# Patient Record
Sex: Female | Born: 1950 | Race: White | Hispanic: No | Marital: Married | Smoking: Never smoker
Health system: Southern US, Community
[De-identification: ages and names within clinical notes are randomized; demographics above are authoritative.]

## PROBLEM LIST (undated history)

## (undated) DIAGNOSIS — N189 Chronic kidney disease, unspecified: Secondary | ICD-10-CM

## (undated) DIAGNOSIS — T7840XA Allergy, unspecified, initial encounter: Secondary | ICD-10-CM

## (undated) DIAGNOSIS — H409 Unspecified glaucoma: Secondary | ICD-10-CM

## (undated) DIAGNOSIS — K219 Gastro-esophageal reflux disease without esophagitis: Secondary | ICD-10-CM

## (undated) DIAGNOSIS — M810 Age-related osteoporosis without current pathological fracture: Secondary | ICD-10-CM

## (undated) DIAGNOSIS — I10 Essential (primary) hypertension: Secondary | ICD-10-CM

## (undated) DIAGNOSIS — D649 Anemia, unspecified: Secondary | ICD-10-CM

## (undated) DIAGNOSIS — J45909 Unspecified asthma, uncomplicated: Secondary | ICD-10-CM

## (undated) DIAGNOSIS — F32A Depression, unspecified: Secondary | ICD-10-CM

## (undated) DIAGNOSIS — G4730 Sleep apnea, unspecified: Secondary | ICD-10-CM

## (undated) DIAGNOSIS — H269 Unspecified cataract: Secondary | ICD-10-CM

## (undated) DIAGNOSIS — N200 Calculus of kidney: Secondary | ICD-10-CM

## (undated) DIAGNOSIS — E785 Hyperlipidemia, unspecified: Secondary | ICD-10-CM

## (undated) HISTORY — DX: Essential (primary) hypertension: I10

## (undated) HISTORY — DX: Unspecified cataract: H26.9

## (undated) HISTORY — DX: Unspecified asthma, uncomplicated: J45.909

## (undated) HISTORY — DX: Chronic kidney disease, unspecified: N18.9

## (undated) HISTORY — DX: Allergy, unspecified, initial encounter: T78.40XA

## (undated) HISTORY — DX: Anemia, unspecified: D64.9

## (undated) HISTORY — DX: Calculus of kidney: N20.0

## (undated) HISTORY — PX: POLYPECTOMY: SHX149

## (undated) HISTORY — DX: Gastro-esophageal reflux disease without esophagitis: K21.9

## (undated) HISTORY — DX: Depression, unspecified: F32.A

## (undated) HISTORY — PX: BREAST BIOPSY: SHX20

## (undated) HISTORY — DX: Unspecified glaucoma: H40.9

## (undated) HISTORY — DX: Sleep apnea, unspecified: G47.30

## (undated) HISTORY — PX: ADENOIDECTOMY: SUR15

## (undated) HISTORY — DX: Hyperlipidemia, unspecified: E78.5

## (undated) HISTORY — DX: Age-related osteoporosis without current pathological fracture: M81.0

## (undated) HISTORY — PX: TONSILLECTOMY: SUR1361

## (undated) HISTORY — PX: COLONOSCOPY: SHX174

---

## 1974-05-08 HISTORY — PX: OTHER SURGICAL HISTORY: SHX169

## 2001-11-21 ENCOUNTER — Encounter: Payer: Self-pay | Admitting: Internal Medicine

## 2001-11-21 ENCOUNTER — Ambulatory Visit (HOSPITAL_COMMUNITY): Admission: RE | Admit: 2001-11-21 | Discharge: 2001-11-21 | Payer: Self-pay | Admitting: Internal Medicine

## 2001-12-04 ENCOUNTER — Other Ambulatory Visit: Admission: RE | Admit: 2001-12-04 | Discharge: 2001-12-04 | Payer: Self-pay | Admitting: Internal Medicine

## 2002-02-20 LAB — HM COLONOSCOPY

## 2003-09-07 ENCOUNTER — Ambulatory Visit (HOSPITAL_COMMUNITY): Admission: RE | Admit: 2003-09-07 | Discharge: 2003-09-07 | Payer: Self-pay | Admitting: Internal Medicine

## 2005-06-22 ENCOUNTER — Ambulatory Visit (HOSPITAL_COMMUNITY): Admission: RE | Admit: 2005-06-22 | Discharge: 2005-06-22 | Payer: Self-pay | Admitting: Internal Medicine

## 2005-06-23 ENCOUNTER — Ambulatory Visit: Payer: Self-pay | Admitting: Internal Medicine

## 2005-07-03 ENCOUNTER — Encounter: Payer: Self-pay | Admitting: Internal Medicine

## 2005-07-03 ENCOUNTER — Other Ambulatory Visit: Admission: RE | Admit: 2005-07-03 | Discharge: 2005-07-03 | Payer: Self-pay | Admitting: Internal Medicine

## 2005-07-03 ENCOUNTER — Ambulatory Visit: Payer: Self-pay | Admitting: Internal Medicine

## 2005-07-03 LAB — CONVERTED CEMR LAB: Pap Smear: NORMAL

## 2005-08-08 ENCOUNTER — Ambulatory Visit: Payer: Self-pay | Admitting: Internal Medicine

## 2005-08-14 ENCOUNTER — Ambulatory Visit: Payer: Self-pay | Admitting: Internal Medicine

## 2005-10-26 ENCOUNTER — Ambulatory Visit: Payer: Self-pay | Admitting: Internal Medicine

## 2006-02-05 ENCOUNTER — Ambulatory Visit: Payer: Self-pay | Admitting: Internal Medicine

## 2006-02-12 ENCOUNTER — Ambulatory Visit: Payer: Self-pay | Admitting: Internal Medicine

## 2006-02-12 DIAGNOSIS — I10 Essential (primary) hypertension: Secondary | ICD-10-CM | POA: Insufficient documentation

## 2006-02-12 DIAGNOSIS — B029 Zoster without complications: Secondary | ICD-10-CM | POA: Insufficient documentation

## 2006-02-12 DIAGNOSIS — Z87442 Personal history of urinary calculi: Secondary | ICD-10-CM

## 2006-02-12 DIAGNOSIS — R030 Elevated blood-pressure reading, without diagnosis of hypertension: Secondary | ICD-10-CM

## 2006-02-12 DIAGNOSIS — E785 Hyperlipidemia, unspecified: Secondary | ICD-10-CM

## 2006-02-12 DIAGNOSIS — E1169 Type 2 diabetes mellitus with other specified complication: Secondary | ICD-10-CM | POA: Insufficient documentation

## 2006-05-15 ENCOUNTER — Ambulatory Visit: Payer: Self-pay | Admitting: Internal Medicine

## 2006-11-08 ENCOUNTER — Ambulatory Visit: Payer: Self-pay | Admitting: Internal Medicine

## 2006-11-08 LAB — CONVERTED CEMR LAB
ALT: 31 units/L (ref 0–35)
AST: 25 units/L (ref 0–37)
Albumin: 4.2 g/dL (ref 3.5–5.2)
Alkaline Phosphatase: 74 units/L (ref 39–117)
BUN: 12 mg/dL (ref 6–23)
Basophils Absolute: 0 10*3/uL (ref 0.0–0.1)
Basophils Relative: 0.1 % (ref 0.0–1.0)
Bilirubin, Direct: 0.1 mg/dL (ref 0.0–0.3)
CO2: 31 meq/L (ref 19–32)
Calcium: 9.6 mg/dL (ref 8.4–10.5)
Chloride: 103 meq/L (ref 96–112)
Cholesterol: 201 mg/dL (ref 0–200)
Creatinine, Ser: 0.7 mg/dL (ref 0.4–1.2)
Direct LDL: 124.4 mg/dL
Eosinophils Absolute: 0.3 10*3/uL (ref 0.0–0.6)
Eosinophils Relative: 4.8 % (ref 0.0–5.0)
GFR calc Af Amer: 111 mL/min
GFR calc non Af Amer: 92 mL/min
Glucose, Bld: 101 mg/dL — ABNORMAL HIGH (ref 70–99)
HCT: 43.6 % (ref 36.0–46.0)
HDL: 70.2 mg/dL (ref >39.0)
Hemoglobin: 14.6 g/dL (ref 12.0–15.0)
Lymphocytes Relative: 37.8 % (ref 12.0–46.0)
MCHC: 33.3 g/dL (ref 30.0–36.0)
MCV: 90 fL (ref 78.0–100.0)
Monocytes Absolute: 0.4 10*3/uL (ref 0.2–0.7)
Monocytes Relative: 6.2 % (ref 3.0–11.0)
Neutro Abs: 3.4 10*3/uL (ref 1.4–7.7)
Neutrophils Relative %: 51.1 % (ref 43.0–77.0)
Platelets: 260 10*3/uL (ref 150–400)
Potassium: 3.6 meq/L (ref 3.5–5.1)
RBC: 4.85 M/uL (ref 3.87–5.11)
RDW: 11.6 % (ref 11.5–14.6)
Sodium: 142 meq/L (ref 135–145)
TSH: 0.72 microintl units/mL (ref 0.35–5.50)
Total Bilirubin: 0.8 mg/dL (ref 0.3–1.2)
Total CHOL/HDL Ratio: 2.9
Total Protein: 7.7 g/dL (ref 6.0–8.3)
Triglycerides: 74 mg/dL (ref 0–149)
VLDL: 15 mg/dL (ref 0–40)
WBC: 6.6 10*3/uL (ref 4.5–10.5)

## 2006-11-27 ENCOUNTER — Ambulatory Visit: Payer: Self-pay | Admitting: Internal Medicine

## 2006-11-27 ENCOUNTER — Encounter: Payer: Self-pay | Admitting: Internal Medicine

## 2006-11-27 ENCOUNTER — Other Ambulatory Visit: Admission: RE | Admit: 2006-11-27 | Discharge: 2006-11-27 | Payer: Self-pay | Admitting: Internal Medicine

## 2006-11-30 ENCOUNTER — Ambulatory Visit: Payer: Self-pay | Admitting: Gastroenterology

## 2006-12-12 ENCOUNTER — Ambulatory Visit (HOSPITAL_COMMUNITY): Admission: RE | Admit: 2006-12-12 | Discharge: 2006-12-12 | Payer: Self-pay | Admitting: Internal Medicine

## 2006-12-28 ENCOUNTER — Ambulatory Visit: Payer: Self-pay | Admitting: Internal Medicine

## 2007-04-19 ENCOUNTER — Telehealth: Payer: Self-pay | Admitting: Internal Medicine

## 2007-04-19 ENCOUNTER — Ambulatory Visit: Payer: Self-pay | Admitting: Cardiology

## 2007-04-19 ENCOUNTER — Ambulatory Visit: Payer: Self-pay | Admitting: Internal Medicine

## 2007-04-19 DIAGNOSIS — N200 Calculus of kidney: Secondary | ICD-10-CM

## 2007-04-19 LAB — CONVERTED CEMR LAB
Bilirubin Urine: NEGATIVE
Glucose, Urine, Semiquant: NEGATIVE
Ketones, urine, test strip: NEGATIVE
Nitrite: NEGATIVE
Specific Gravity, Urine: >=1.03
Urobilinogen, UA: 0.2
WBC Urine, dipstick: NEGATIVE
pH: 6

## 2007-04-22 ENCOUNTER — Encounter: Payer: Self-pay | Admitting: Internal Medicine

## 2007-05-30 ENCOUNTER — Encounter: Payer: Self-pay | Admitting: Internal Medicine

## 2007-09-24 ENCOUNTER — Telehealth: Payer: Self-pay | Admitting: Internal Medicine

## 2007-10-03 ENCOUNTER — Telehealth: Payer: Self-pay | Admitting: Internal Medicine

## 2007-10-11 ENCOUNTER — Encounter: Payer: Self-pay | Admitting: Internal Medicine

## 2008-01-20 ENCOUNTER — Encounter: Payer: Self-pay | Admitting: Internal Medicine

## 2009-01-21 ENCOUNTER — Encounter (INDEPENDENT_AMBULATORY_CARE_PROVIDER_SITE_OTHER): Payer: Self-pay | Admitting: *Deleted

## 2009-05-08 HISTORY — PX: LITHOTRIPSY: SUR834

## 2009-05-22 ENCOUNTER — Encounter: Payer: Self-pay | Admitting: Internal Medicine

## 2009-05-22 ENCOUNTER — Emergency Department (HOSPITAL_COMMUNITY): Admission: EM | Admit: 2009-05-22 | Discharge: 2009-05-22 | Payer: Self-pay | Admitting: Emergency Medicine

## 2009-05-24 ENCOUNTER — Telehealth: Payer: Self-pay | Admitting: Internal Medicine

## 2009-05-26 ENCOUNTER — Encounter: Payer: Self-pay | Admitting: Internal Medicine

## 2009-06-02 ENCOUNTER — Encounter: Payer: Self-pay | Admitting: Internal Medicine

## 2009-06-25 ENCOUNTER — Ambulatory Visit: Payer: Self-pay | Admitting: Internal Medicine

## 2009-06-25 LAB — CONVERTED CEMR LAB
ALT: 41 units/L — ABNORMAL HIGH (ref 0–35)
AST: 25 units/L (ref 0–37)
BUN: 17 mg/dL (ref 6–23)
Basophils Relative: 0 % (ref 0.0–3.0)
Bilirubin Urine: NEGATIVE
Blood in Urine, dipstick: NEGATIVE
CO2: 28 meq/L (ref 19–32)
Calcium: 9.8 mg/dL (ref 8.4–10.5)
Cholesterol: 235 mg/dL — ABNORMAL HIGH (ref 0–200)
Direct LDL: 177.4 mg/dL
Hemoglobin: 13.7 g/dL (ref 12.0–15.0)
Lymphocytes Relative: 38.9 % (ref 12.0–46.0)
Lymphs Abs: 2.5 10*3/uL (ref 0.7–4.0)
MCV: 94.2 fL (ref 78.0–100.0)
Neutrophils Relative %: 50.2 % (ref 43.0–77.0)
Nitrite: NEGATIVE
Protein, U semiquant: NEGATIVE
RBC: 4.43 M/uL (ref 3.87–5.11)
Sodium: 141 meq/L (ref 135–145)
Total Protein: 7.3 g/dL (ref 6.0–8.3)
Triglycerides: 99 mg/dL (ref 0.0–149.0)
Urobilinogen, UA: 0.2
WBC Urine, dipstick: NEGATIVE

## 2009-06-28 ENCOUNTER — Ambulatory Visit (HOSPITAL_COMMUNITY): Admission: RE | Admit: 2009-06-28 | Discharge: 2009-06-28 | Payer: Self-pay | Admitting: Internal Medicine

## 2009-06-30 ENCOUNTER — Encounter: Payer: Self-pay | Admitting: Internal Medicine

## 2009-07-02 ENCOUNTER — Encounter: Payer: Self-pay | Admitting: Internal Medicine

## 2009-07-05 ENCOUNTER — Other Ambulatory Visit: Admission: RE | Admit: 2009-07-05 | Discharge: 2009-07-05 | Payer: Self-pay | Admitting: Internal Medicine

## 2009-07-05 ENCOUNTER — Ambulatory Visit: Payer: Self-pay | Admitting: Internal Medicine

## 2009-07-05 LAB — HM PAP SMEAR

## 2009-07-09 ENCOUNTER — Encounter: Admission: RE | Admit: 2009-07-09 | Discharge: 2009-07-09 | Payer: Self-pay | Admitting: Internal Medicine

## 2009-07-09 HISTORY — PX: BREAST BIOPSY: SHX20

## 2009-07-09 LAB — CONVERTED CEMR LAB: Pap Smear: NEGATIVE

## 2009-07-09 LAB — HM MAMMOGRAPHY

## 2009-09-28 ENCOUNTER — Ambulatory Visit: Payer: Self-pay | Admitting: Internal Medicine

## 2009-09-28 LAB — CONVERTED CEMR LAB
ALT: 33 units/L (ref 0–35)
Bilirubin, Direct: 0.1 mg/dL (ref 0.0–0.3)
HDL: 52.9 mg/dL (ref >39.00)
LDL Cholesterol: 99 mg/dL (ref 0–99)
Total Protein: 7.3 g/dL (ref 6.0–8.3)
Triglycerides: 79 mg/dL (ref 0.0–149.0)
VLDL: 15.8 mg/dL (ref 0.0–40.0)

## 2009-10-05 ENCOUNTER — Ambulatory Visit: Payer: Self-pay | Admitting: Internal Medicine

## 2009-10-05 DIAGNOSIS — R109 Unspecified abdominal pain: Secondary | ICD-10-CM | POA: Insufficient documentation

## 2009-10-13 ENCOUNTER — Telehealth: Payer: Self-pay | Admitting: Internal Medicine

## 2009-10-25 ENCOUNTER — Telehealth: Payer: Self-pay | Admitting: Internal Medicine

## 2009-12-10 ENCOUNTER — Encounter: Payer: Self-pay | Admitting: Internal Medicine

## 2009-12-31 ENCOUNTER — Encounter: Payer: Self-pay | Admitting: Internal Medicine

## 2010-01-06 ENCOUNTER — Ambulatory Visit (HOSPITAL_COMMUNITY): Admission: RE | Admit: 2010-01-06 | Discharge: 2010-01-06 | Payer: Self-pay | Admitting: Urology

## 2010-02-21 ENCOUNTER — Encounter: Payer: Self-pay | Admitting: Internal Medicine

## 2010-06-07 NOTE — Letter (Signed)
Summary: [facility]  [facility]   Imported By: Maryln Gottron 02/28/2010 11:16:14  _____________________________________________________________________  External Attachment:    Type:   Image     Comment:   External Document

## 2010-06-07 NOTE — Letter (Signed)
Summary: [facility]   Imported By: Maryln Gottron 05/27/2009 10:54:09  _____________________________________________________________________  External Attachment:    Type:   Image     Comment:   External Document

## 2010-06-07 NOTE — Letter (Signed)
Summary: [facility]  [facility]   Imported By: Maryln Gottron 06/10/2009 14:49:42  _____________________________________________________________________  External Attachment:    Type:   Image     Comment:   External Document

## 2010-06-07 NOTE — Assessment & Plan Note (Signed)
Summary: CPX//CCM   Vital Signs:  Patient profile:   60 year old female Menstrual status:  postmenopausal Height:      60.75 inches Weight:      171 pounds BMI:     32.69 Pulse rate:   84 / minute Resp:     12 per minute BP sitting:   144 / 80  (left arm)  Vitals Entered By: Gladis Riffle, RN (July 05, 2009 9:57 AM)  Nutrition Counseling: Patient's BMI is greater than 25 and therefore counseled on weight management options. CC: cpx with Pap, labs done Is Patient Diabetic? No Comments out of simvastatin x 6 months, also out of budeprion but does not feel she needs     Menstrual Status postmenopausal Last PAP Result Normal   CC:  cpx with Pap and labs done.  History of Present Illness: cpx  Preventive Screening-Counseling & Management  Alcohol-Tobacco     Alcohol drinks/day: <1     Smoking Status: never  Current Problems (verified): 1)  Nephrolithiasis  (ICD-592.0) 2)  Essential Hypertension  (ICD-401.9) 3)  Physical Examination, Normal  (ICD-V70.0) 4)  Elevated Blood Pressure Without Diagnosis of Hypertension  (ICD-796.2) 5)  Herpes Zoster  (ICD-053.9) 6)  Nephrolithiasis, Hx of  (ICD-V13.01) 7)  Hypertension  (ICD-401.9) 8)  Hyperlipidemia  (ICD-272.4)  Current Medications (verified): 1)  Caltrate 600+d Plus 600-400 Mg-Unit Tabs (Calcium Carbonate-Vit D-Min) .... Take 1 Tablet By Mouth Twice A Day 2)  Omega-3 1000 Mg Caps (Omega-3 Fatty Acids) .... Two Times A Day 3)  Claritin 10 Mg  Tabs (Loratadine) .... Prn 4)  Multivitamins  Tabs (Multiple Vitamin) .... Once Daily  Allergies: 1)  Iodine (Iodine)  Past History:  Past Medical History: Last updated: 02/12/2006 Hyperlipidemia Hypertension Nephrolithiasis, hx of  Past Surgical History: Last updated: 11/05/2006 Caesarean section X2 Tonsillectomy, adenoidectomy as child hysteroscopy  Family History: Last updated: 07/05/2009 mother with hypertension father deceased--"heart failure" osteogenesis  imperfecta---age 69s aunt with DM  Social History: Last updated: 11/27/2006 Married disabeled son at home Never Smoked Alcohol use-yes  Risk Factors: Alcohol Use: <1 (07/05/2009)  Risk Factors: Smoking Status: never (07/05/2009)  Family History: mother with hypertension father deceased--"heart failure" osteogenesis imperfecta---age 48s aunt with DM  Review of Systems       All other systems reviewed and were negative   Physical Exam  Genitalia:  Pelvic Exam:        External: normal female genitalia without lesions or masses        Vagina: normal without lesions or masses        Cervix: normal without lesions or masses        Adnexa: normal bimanual exam without masses or fullness        Uterus: normal by palpation        Pap smear: performed   Impression & Recommendations:  Problem # 1:  PHYSICAL EXAMINATION, NORMAL (ICD-V70.0) health maint UTD she needs to lose weight- discussed exercise/low calorie diet  Problem # 2:  ESSENTIAL HYPERTENSION (ICD-401.9) resume lisinopril The following medications were removed from the medication list:    Lisinopril 20 Mg Tabs (Lisinopril) ..... Once daily--needs office visit for additional refills Her updated medication list for this problem includes:    Lisinopril 20 Mg Tabs (Lisinopril) .Marland Kitchen... 1 tablet by mouth daily  BP today: 144/80 Prior BP: 128/88 (04/19/2007)  Prior 10 Yr Risk Heart Disease: Not enough information (02/12/2006)  Labs Reviewed: K+: 4.3 (06/25/2009) Creat: : 0.8 (06/25/2009)   Chol:  235 (06/25/2009)   HDL: 58.60 (06/25/2009)   LDL: DEL (11/08/2006)   TG: 99.0 (06/25/2009)  Problem # 3:  HYPERLIPIDEMIA (ICD-272.4) resume simvastatin The following medications were removed from the medication list:    Simvastatin 40 Mg Tabs (Simvastatin) .Marland Kitchen... Take 1 tablet by mouth at bedtime Her updated medication list for this problem includes:    Simvastatin 40 Mg Tabs (Simvastatin) .Marland Kitchen... Take one tablet at  bedtime  Complete Medication List: 1)  Caltrate 600+d Plus 600-400 Mg-unit Tabs (Calcium carbonate-vit d-min) .... Take 1 tablet by mouth twice a day 2)  Omega-3 1000 Mg Caps (Omega-3 fatty acids) .... Two times a day 3)  Claritin 10 Mg Tabs (Loratadine) .... Prn 4)  Multivitamins Tabs (Multiple vitamin) .... Once daily 5)  Lisinopril 20 Mg Tabs (Lisinopril) .Marland Kitchen.. 1 tablet by mouth daily 6)  Simvastatin 40 Mg Tabs (Simvastatin) .... Take one tablet at bedtime  Patient Instructions: 1)  Please schedule a follow-up appointment in 3 months. 2)  lipids 272.4 3)  liver 995.2  Prescriptions: SIMVASTATIN 40 MG TABS (SIMVASTATIN) Take one tablet at bedtime  #90 x 3   Entered and Authorized by:   Birdie Sons MD   Signed by:   Birdie Sons MD on 07/05/2009   Method used:   Electronically to        [facility]* (mail-order)       [address] [place], [place]  [address]       Ph: [phone]       Fax: [phone]   RxID:   [id number] LISINOPRIL 20 MG TABS (LISINOPRIL) 1 tablet by mouth daily  #90 x 3   Entered and Authorized by:   Birdie Sons MD   Signed by:   Birdie Sons MD on 07/05/2009   Method used:   Electronically to        [facility]* (mail-order)       [address] [place], [place]  [address]       Ph: [phone]       Fax: [phone]   RxID:   [id number]    Physical Exam General Appearance: well developed, well nourished, no acute distress Eyes: conjunctiva and lids normal, PERRL, EOMI,  Ears, Nose, Mouth, Throat: TM clear, nares clear, oral exam WNL Neck: supple, no lymphadenopathy, no thyromegaly, no JVD Respiratory: clear to auscultation and percussion, respiratory effort normal Cardiovascular: regular rate and rhythm, S1-S2, no murmur, rub or gallop, no bruits, peripheral pulses normal and symmetric, no cyanosis, clubbing, edema or varicosities Chest: no scars, masses, tenderness; no asymmetry, skin changes,  nipple discharge   Gastrointestinal: soft, non-tender; no hepatosplenomegaly, masses; active bowel sounds all quadrants,  Lymphatic: no cervical, axillary or inguinal adenopathy Musculoskeletal: gait normal, muscle tone and strength WNL, no joint swelling, effusions, discoloration, crepitus  Skin: clear, good turgor, color WNL, no rashes, lesions, or ulcerations Neurologic: normal mental status, normal reflexes, normal strength, sensation, and motion Psychiatric: alert; oriented to person, place and time Other Exam:     Prevention & Chronic Care Immunizations   Influenza vaccine: fluzone  (01/17/2008)    Tetanus booster: 05/09/1999: Historical    Pneumococcal vaccine: Not documented    H. zoster vaccine: Not documented  Colorectal Screening   Hemoccult: Not documented    Colonoscopy:  Results: Normal.   (02/20/2002)  Other Screening   Pap smear: Normal  (07/03/2005)    Mammogram: mammograms  for comparison - BI-RADS 0 -^MM DIGITAL SCREENING  (06/28/2009)    DXA bone density scan: Not documented   Smoking status: never  (07/05/2009)  Lipids   Total Cholesterol: 235  (06/25/2009)   LDL: DEL  (11/08/2006)   LDL Direct: 177.4  (06/25/2009)   HDL: 58.60  (06/25/2009)   Triglycerides: 99.0  (06/25/2009)    SGOT (AST): 25  (06/25/2009)   SGPT (ALT): 41  (06/25/2009)   Alkaline phosphatase: 69  (06/25/2009)   Total bilirubin: 0.5  (06/25/2009)  Hypertension   Last Blood Pressure: 144 / 80  (07/05/2009)   Serum creatinine: 0.8  (06/25/2009)   Serum potassium 4.3  (06/25/2009)  Self-Management Support :    Patient will work on the following items until the next clinic visit to reach self-care goals:     Medications and monitoring: take my medicines every day  (07/05/2009)     Eating: drink diet soda or water instead of juice or soda, eat more vegetables  (07/05/2009)     Activity: take a 30 minute walk every day, take the stairs instead of the elevator, park at the far  end of the parking lot, join a walking program  (07/05/2009)    Hypertension self-management support: , Referred for medical nutrition therapy  (07/05/2009)    Lipid self-management support: , Referred for medical nutrition therapy  (07/05/2009)    Nursing Instructions: Refer for medical nutrition therapy (see order)    Diabetes Self Management Training Referral Patient Name: Rhonda Spence [facility] Date Of Birth: Dec 12, 1950 MRN: [id number] Current Diagnosis:  NEPHROLITHIASIS (ICD-592.0) ESSENTIAL HYPERTENSION (ICD-401.9) PHYSICAL EXAMINATION, NORMAL (ICD-V70.0) ELEVATED BLOOD PRESSURE WITHOUT DIAGNOSIS OF HYPERTENSION (ICD-796.2) HERPES ZOSTER (ICD-053.9) NEPHROLITHIASIS, HX OF (ICD-V13.01) HYPERTENSION (ICD-401.9) HYPERLIPIDEMIA (ICD-272.4)   Complicating Conditions:  Appended Document: CPX//CCM   Immunizations Administered:  Tetanus Vaccine:    Vaccine Type: Tdap    Site: left deltoid    Mfr: [employer]    Dose: 0.5 ml    Route: IM    Given by: Gladis Riffle, RN    Exp. Date: 07/03/2011    Lot #: [id number]

## 2010-06-07 NOTE — Progress Notes (Signed)
Summary: medication question  Phone Note Call from Patient   Caller: Patient Call For: Birdie Sons MD Summary of Call: Pt is taking Losartin and is on Postassium 10 meq, and is concerned if she should change her BP pill.  Dr. Scarlett Presto has her on the K due to urinary calculus [phone] Initial call taken by: Lynann Beaver CMA,  October 25, 2009 10:21 AM  Follow-up for Phone Call        ok to continue both Follow-up by: Birdie Sons MD,  October 25, 2009 4:44 PM  Additional Follow-up for Phone Call Additional follow up Details #1::        Pt. notified. Additional Follow-up by: Lynann Beaver CMA,  October 25, 2009 4:57 PM

## 2010-06-07 NOTE — Progress Notes (Signed)
Summary: fu on call note  Phone Note Outgoing Call   Call placed by: Triage Call placed to: Patient Summary of Call: Called to followup on on call note for weekend for back pain & nausea.  Did go to ER for severe pain & vomiting.  Meds received Oxycontin, Flomax, a nausea med under the tongue use, a urethral spasms med.  She has an appointment Wed 8:30 with [facility].  There is not anything she needs at this time.  She took Oxycontin Sat 8:45 p & 4a.  Slept all day.  No further pain.  Is drinking lots of water & straining her urine & has not seen anything there.   Initial call taken by: Rudy Jew, RN,  May 24, 2009 3:55 PM

## 2010-06-07 NOTE — Letter (Signed)
Summary: [facility]   Imported By: Maryln Gottron 12/23/2009 11:00:21  _____________________________________________________________________  External Attachment:    Type:   Image     Comment:   External Document

## 2010-06-07 NOTE — Letter (Signed)
Summary: [facility]  [facility]   Imported By: Maryln Gottron 03/23/2010 11:28:07  _____________________________________________________________________  External Attachment:    Type:   Image     Comment:   External Document

## 2010-06-07 NOTE — Letter (Signed)
Summary: [facility]  [facility]   Imported By: Maryln Gottron 06/10/2009 13:59:56  _____________________________________________________________________  External Attachment:    Type:   Image     Comment:   External Document

## 2010-06-07 NOTE — Letter (Signed)
Summary: [facility]  [facility]   Imported By: Maryln Gottron 07/07/2009 14:46:05  _____________________________________________________________________  External Attachment:    Type:   Image     Comment:   External Document

## 2010-06-07 NOTE — Assessment & Plan Note (Signed)
Summary: 3 month rov/nrj   Vital Signs:  Patient profile:   60 year old female Menstrual status:  postmenopausal Weight:      168 pounds Temp:     99.2 degrees F oral Resp:     12 per minute BP sitting:   136 / 80  Vitals Entered By: Lynann Beaver CMA (Oct 05, 2009 9:51 AM) CC: started with LUQ pain last night.  + nausea, - vomiting, + diarrhea Is Patient Diabetic? No Pain Assessment Patient in pain? yes        CC:  started with LUQ pain last night.  + nausea, - vomiting, and + diarrhea.  History of Present Illness:  Follow-Up Visit      This is a 60 year old woman who presents for Follow-up visit.  The patient denies chest pain and palpitations.  Since the last visit the patient notes no new problems or concerns except reports 24 hours of left flank discomfort. Started after working out in the yard the day prior. She states taking a deep breath increases pain as does twisting/turning torso. No SOB.  The patient reports taking meds as prescribed.  When questioned about possible medication side effects, the patient notes none.    All other systems reviewed and were negative   Current Medications (verified): 1)  Caltrate 600+d Plus 600-400 Mg-Unit Tabs (Calcium Carbonate-Vit D-Min) .... Take 1 Tablet By Mouth Twice A Day 2)  Omega-3 1000 Mg Caps (Omega-3 Fatty Acids) .... Two Times A Day 3)  Claritin 10 Mg  Tabs (Loratadine) .... Prn 4)  Multivitamins  Tabs (Multiple Vitamin) .... Once Daily 5)  Lisinopril 20 Mg Tabs (Lisinopril) .Marland Kitchen.. 1 Tablet By Mouth Daily 6)  Simvastatin 40 Mg Tabs (Simvastatin) .... Take One Tablet At Bedtime 7)  Potassium Citrate 10 Meq (1080 Mg) Cr-Tabs (Potassium Citrate) .... Take 1 Tablet By Mouth Once A Day  Allergies (verified): 1)  Iodine (Iodine)  Past History:  Past Medical History: Last updated: 02/12/2006 Hyperlipidemia Hypertension Nephrolithiasis, hx of  Past Surgical History: Last updated: 11/05/2006 Caesarean section  X2 Tonsillectomy, adenoidectomy as child hysteroscopy  Family History: Last updated: 07/05/2009 mother with hypertension father deceased--"heart failure" osteogenesis imperfecta---age 4s aunt with DM  Social History: Last updated: 11/27/2006 Married disabeled son at home Never Smoked Alcohol use-yes  Risk Factors: Alcohol Use: <1 (07/05/2009)  Risk Factors: Smoking Status: never (07/05/2009)  Physical Exam  Head:  normocephalic and atraumatic.   Eyes:  pupils equal and pupils round.   Ears:  R ear normal and L ear normal.   Neck:  No deformities, masses, or tenderness noted. Chest Wall:  No deformities, masses, or tenderness noted. Lungs:  normal respiratory effort and no intercostal retractions.   Heart:  normal rate and regular rhythm.   Abdomen:  Bowel sounds positive,abdomen soft and non-tender without masses, organomegaly or hernias noted. right lower ribs tender to palpation  overweight Msk:  No deformity or scoliosis noted of thoracic or lumbar spine.   Pulses:  R radial normal and L radial normal.   Neurologic:  cranial nerves II-XII intact and gait normal.     Impression & Recommendations:  Problem # 1:  ESSENTIAL HYPERTENSION (ICD-401.9) controlled continue current medications  Her updated medication list for this problem includes:    Lisinopril 20 Mg Tabs (Lisinopril) .Marland Kitchen... 1 tablet by mouth daily  BP today: 136/80 Prior BP: 144/80 (07/05/2009)  Prior 10 Yr Risk Heart Disease: Not enough information (02/12/2006)  Labs Reviewed: K+: 4.3 (  06/25/2009) Creat: : 0.8 (06/25/2009)   Chol: 168 (09/28/2009)   HDL: 52.90 (09/28/2009)   LDL: 99 (09/28/2009)   TG: 79.0 (09/28/2009)  Problem # 2:  HYPERLIPIDEMIA (ICD-272.4) Assessment: Improved well conttrolled continue current medications  Her updated medication list for this problem includes:    Simvastatin 40 Mg Tabs (Simvastatin) .Marland Kitchen... Take one tablet at bedtime  Labs Reviewed: SGOT: 25  (09/28/2009)   SGPT: 33 (09/28/2009)  Prior 10 Yr Risk Heart Disease: Not enough information (02/12/2006)   HDL:52.90 (09/28/2009), 58.60 (06/25/2009)  LDL:99 (09/28/2009), DEL (16/02/9603)  Chol:168 (09/28/2009), 235 (06/25/2009)  Trig:79.0 (09/28/2009), 99.0 (06/25/2009)  Problem # 3:  NEPHROLITHIASIS, HX OF (ICD-V13.01) reviewed by dr Aldean Ast i have reviewed note and updated med list  Problem # 4:  ABDOMINAL PAIN (ICD-789.00) likely mechanical based on exam and hx she will call if sxs persist  Complete Medication List: 1)  Caltrate 600+d Plus 600-400 Mg-unit Tabs (Calcium carbonate-vit d-min) .... Take 1 tablet by mouth twice a day 2)  Omega-3 1000 Mg Caps (Omega-3 fatty acids) .... Two times a day 3)  Claritin 10 Mg Tabs (Loratadine) .... Prn 4)  Multivitamins Tabs (Multiple vitamin) .... Once daily 5)  Lisinopril 20 Mg Tabs (Lisinopril) .Marland Kitchen.. 1 tablet by mouth daily 6)  Simvastatin 40 Mg Tabs (Simvastatin) .... Take one tablet at bedtime 7)  Potassium Citrate 10 Meq (1080 Mg) Cr-tabs (Potassium citrate) .... Take 1 tablet by mouth once a day  Patient Instructions: 1)  CPX in February   Appended Document: 3 month rov/nrj     Allergies: 1)  Iodine (Iodine)   Impression & Recommendations:  Problem # 1:  ELEVATED BLOOD PRESSURE WITHOUT DIAGNOSIS OF HYPERTENSION (ICD-796.2) at the end of exam pt complains of cough---she associates with lisinopril discussed at length she will change meds and monitor bp call for bp > 135/85 The following medications were removed from the medication list:    Lisinopril 20 Mg Tabs (Lisinopril) .Marland Kitchen... 1 tablet by mouth daily Her updated medication list for this problem includes:    Losartan Potassium 100 Mg Tabs (Losartan potassium) .Marland Kitchen... Take 1 tablet by mouth once a day  Complete Medication List: 1)  Caltrate 600+d Plus 600-400 Mg-unit Tabs (Calcium carbonate-vit d-min) .... Take 1 tablet by mouth twice a day 2)  Omega-3 1000 Mg Caps  (Omega-3 fatty acids) .... Two times a day 3)  Claritin 10 Mg Tabs (Loratadine) .... Prn 4)  Multivitamins Tabs (Multiple vitamin) .... Once daily 5)  Simvastatin 40 Mg Tabs (Simvastatin) .... Take one tablet at bedtime 6)  Potassium Citrate 10 Meq (1080 Mg) Cr-tabs (Potassium citrate) .... Take 1 tablet by mouth once a day 7)  Losartan Potassium 100 Mg Tabs (Losartan potassium) .... Take 1 tablet by mouth once a day Prescriptions: LOSARTAN POTASSIUM 100 MG TABS (LOSARTAN POTASSIUM) Take 1 tablet by mouth once a day  #90 x 3   Entered and Authorized by:   Birdie Sons MD   Signed by:   Birdie Sons MD on 10/05/2009   Method used:   Faxed to ...       [facility] (mail-order)       [address] [place], [place]  [address]       Ph: [phone]       Fax: [phone]   RxID:   [id number]

## 2010-06-07 NOTE — Progress Notes (Signed)
  Phone Note Call from Patient   Caller: Patient Call For: Birdie Sons MD Summary of Call: Pt was changed from Lisinopril to Losarten recently. She was reading the information on the meds, and is questioning if she should take it with a potassium supplement. [phone] Initial call taken by: Lynann Beaver CMA,  October 13, 2009 8:39 AM  Follow-up for Phone Call        OK to d/c potassium substitute Follow-up by: Gordy Savers  MD,  October 13, 2009 12:16 PM    New/Updated Medications: POTASSIUM CITRATE 10 MEQ (1080 MG) CR-TABS (POTASSIUM CITRATE) Take 1 tablet by mouth once a day

## 2010-06-08 LAB — HM MAMMOGRAPHY: HM Mammogram: NORMAL

## 2010-06-23 ENCOUNTER — Other Ambulatory Visit: Payer: Self-pay | Admitting: Internal Medicine

## 2010-06-23 DIAGNOSIS — Z1231 Encounter for screening mammogram for malignant neoplasm of breast: Secondary | ICD-10-CM

## 2010-06-30 ENCOUNTER — Ambulatory Visit
Admission: RE | Admit: 2010-06-30 | Discharge: 2010-06-30 | Disposition: A | Discharge status: Home / Self Care | Payer: BC Managed Care – PPO | Source: Ambulatory Visit | Attending: Internal Medicine | Admitting: Internal Medicine

## 2010-06-30 DIAGNOSIS — Z1231 Encounter for screening mammogram for malignant neoplasm of breast: Secondary | ICD-10-CM

## 2010-07-21 LAB — BASIC METABOLIC PANEL
Creatinine, Ser: 0.84 mg/dL (ref 0.4–1.2)
GFR calc Af Amer: >60 mL/min (ref >60)
GFR calc non Af Amer: >60 mL/min (ref >60)
Potassium: 3.4 mEq/L — ABNORMAL LOW (ref 3.5–5.1)
Sodium: 141 mEq/L (ref 135–145)

## 2010-07-21 LAB — CBC
MCH: 31.6 pg (ref 26.0–34.0)
MCHC: 35.1 g/dL (ref 30.0–36.0)
Platelets: 235 10*3/uL (ref 150–400)
RBC: 4.56 MIL/uL (ref 3.87–5.11)

## 2010-07-24 LAB — URINALYSIS, ROUTINE W REFLEX MICROSCOPIC
Bilirubin Urine: NEGATIVE
Ketones, ur: NEGATIVE mg/dL
Protein, ur: NEGATIVE mg/dL
Specific Gravity, Urine: 1.017 (ref 1.005–1.030)

## 2010-07-24 LAB — URINE MICROSCOPIC-ADD ON

## 2010-07-29 ENCOUNTER — Other Ambulatory Visit (INDEPENDENT_AMBULATORY_CARE_PROVIDER_SITE_OTHER): Payer: BC Managed Care – PPO | Admitting: Internal Medicine

## 2010-07-29 DIAGNOSIS — Z0000 Encounter for general adult medical examination without abnormal findings: Secondary | ICD-10-CM

## 2010-07-29 LAB — TSH: TSH: 1.17 u[IU]/mL (ref 0.35–5.50)

## 2010-07-29 LAB — LIPID PANEL
LDL Cholesterol: 98 mg/dL (ref 0–99)
Total CHOL/HDL Ratio: 3
VLDL: 17 mg/dL (ref 0.0–40.0)

## 2010-07-29 LAB — HEPATIC FUNCTION PANEL
ALT: 34 U/L (ref 0–35)
AST: 25 U/L (ref 0–37)
Albumin: 4.1 g/dL (ref 3.5–5.2)
Bilirubin, Direct: 0.1 mg/dL (ref 0.0–0.3)
Total Bilirubin: 0.6 mg/dL (ref 0.3–1.2)

## 2010-07-29 LAB — POCT URINALYSIS DIPSTICK
Blood, UA: NEGATIVE
Glucose, UA: NEGATIVE
Protein, UA: NEGATIVE

## 2010-07-29 LAB — CBC WITH DIFFERENTIAL/PLATELET
HCT: 39.6 % (ref 36.0–46.0)
Hemoglobin: 13.8 g/dL (ref 12.0–15.0)
MCHC: 34.8 g/dL (ref 30.0–36.0)
Monocytes Absolute: 0.4 10*3/uL (ref 0.1–1.0)
Neutrophils Relative %: 48.2 % (ref 43.0–77.0)
RBC: 4.3 Mil/uL (ref 3.87–5.11)
RDW: 12.3 % (ref 11.5–14.6)

## 2010-07-29 LAB — BASIC METABOLIC PANEL
Chloride: 103 mEq/L (ref 96–112)
Sodium: 140 mEq/L (ref 135–145)

## 2010-08-03 ENCOUNTER — Encounter: Payer: Self-pay | Admitting: Internal Medicine

## 2010-08-05 ENCOUNTER — Ambulatory Visit (INDEPENDENT_AMBULATORY_CARE_PROVIDER_SITE_OTHER): Payer: BC Managed Care – PPO | Admitting: Internal Medicine

## 2010-08-05 ENCOUNTER — Encounter: Payer: Self-pay | Admitting: Internal Medicine

## 2010-08-05 VITALS — BP 132/84 | HR 96 | Temp 98.6°F | Ht 60.5 in | Wt 167.0 lb

## 2010-08-05 DIAGNOSIS — Z2911 Encounter for prophylactic immunotherapy for respiratory syncytial virus (RSV): Secondary | ICD-10-CM

## 2010-08-05 DIAGNOSIS — I10 Essential (primary) hypertension: Secondary | ICD-10-CM

## 2010-08-05 DIAGNOSIS — Z0000 Encounter for general adult medical examination without abnormal findings: Secondary | ICD-10-CM

## 2010-08-05 MED ORDER — B COMPLEX PO TABS: 1.0000 | ORAL_TABLET | Freq: Every day | ORAL | Status: DC

## 2010-08-05 NOTE — Assessment & Plan Note (Signed)
Controlled Continue current meds 

## 2010-08-05 NOTE — Progress Notes (Signed)
  Subjective:    Patient ID: Rhonda Spence, female    DOB: May 02, 1951, 60 y.o.   MRN: [id number]  HPI  cpx Past Medical History  Diagnosis Date  . Hyperlipidemia   . Hypertension   . Chronic kidney disease     stones   Past Surgical History  Procedure Date  . Cesarean section     x2  . Tonsillectomy   . Adenoidectomy     reports that she has never smoked. She does not have any smokeless tobacco history on file. She reports that she drinks alcohol. Her drug history not on file. family history includes Breast cancer in her sister; Heart disease in her father; Heart failure in her father and mother; Hypertension in her mother; Osteogenesis imperfecta in her father and sister; and Stroke in her daughter. Allergies  Allergen Reactions  . Iodine     REACTION: congestion     Review of Systems  patient denies chest pain, shortness of breath, orthopnea. Denies lower extremity edema, abdominal pain, change in appetite, change in bowel movements. Patient denies rashes, musculoskeletal complaints. No other specific complaints in a complete review of systems.      Objective:   Physical Exam  Well-developed well-nourished female in no acute distress. HEENT exam atraumatic, normocephalic, extraocular muscles are intact. Neck is supple. No jugular venous distention no thyromegaly. Chest clear to auscultation without increased work of breathing. Cardiac exam S1 and S2 are regular. Abdominal exam active bowel sounds, soft, nontender. Extremities no edema. Neurologic exam she is alert without any motor sensory deficits. Gait is normal.        Assessment & Plan:  Well visit: Health maintenance is up-to-date. Shingles vaccine given today.

## 2010-08-15 ENCOUNTER — Other Ambulatory Visit: Payer: Self-pay | Admitting: *Deleted

## 2010-08-15 DIAGNOSIS — I10 Essential (primary) hypertension: Secondary | ICD-10-CM

## 2010-08-15 DIAGNOSIS — E785 Hyperlipidemia, unspecified: Secondary | ICD-10-CM

## 2010-08-15 MED ORDER — HYDROCHLOROTHIAZIDE 25 MG PO TABS: 25.0000 mg | ORAL_TABLET | Freq: Every day | ORAL | Status: DC

## 2010-08-15 MED ORDER — B COMPLEX PO TABS: 1.0000 | ORAL_TABLET | Freq: Every day | ORAL | Status: AC

## 2010-08-15 MED ORDER — SIMVASTATIN 40 MG PO TABS: 40.0000 mg | ORAL_TABLET | Freq: Every day | ORAL | Status: DC

## 2010-08-15 MED ORDER — LOSARTAN POTASSIUM 100 MG PO TABS: 100.0000 mg | ORAL_TABLET | Freq: Every day | ORAL | Status: DC

## 2010-08-15 MED ORDER — POTASSIUM CITRATE ER 10 MEQ (1080 MG) PO TBCR: 10.0000 meq | EXTENDED_RELEASE_TABLET | Freq: Every day | ORAL | Status: DC

## 2011-01-27 ENCOUNTER — Telehealth: Payer: Self-pay | Admitting: Internal Medicine

## 2011-01-27 DIAGNOSIS — E785 Hyperlipidemia, unspecified: Secondary | ICD-10-CM

## 2011-01-27 DIAGNOSIS — I10 Essential (primary) hypertension: Secondary | ICD-10-CM

## 2011-01-27 MED ORDER — SIMVASTATIN 40 MG PO TABS: 40.0000 mg | ORAL_TABLET | Freq: Every day | ORAL | Status: DC

## 2011-01-27 MED ORDER — LOSARTAN POTASSIUM 100 MG PO TABS: 100.0000 mg | ORAL_TABLET | Freq: Every day | ORAL | Status: DC

## 2011-01-27 MED ORDER — POTASSIUM CITRATE ER 10 MEQ (1080 MG) PO TBCR: 10.0000 meq | EXTENDED_RELEASE_TABLET | Freq: Every day | ORAL | Status: DC

## 2011-01-27 MED ORDER — HYDROCHLOROTHIAZIDE 25 MG PO TABS: 25.0000 mg | ORAL_TABLET | Freq: Every day | ORAL | Status: DC

## 2011-01-27 NOTE — Telephone Encounter (Signed)
rx sent in electronically 

## 2011-01-27 NOTE — Telephone Encounter (Signed)
Please refill a year's worth  of Losartin 100mg , Simvastatin 40mg , Hctz 25mg , Potassium Citrate to [facility]. Thanks.

## 2011-02-17 ENCOUNTER — Other Ambulatory Visit: Payer: Self-pay | Admitting: *Deleted

## 2011-02-17 MED ORDER — POTASSIUM CITRATE ER 10 MEQ (1080 MG) PO TBCR: 20.0000 meq | EXTENDED_RELEASE_TABLET | Freq: Every day | ORAL | Status: DC

## 2011-02-20 ENCOUNTER — Other Ambulatory Visit: Payer: Self-pay | Admitting: *Deleted

## 2011-04-24 ENCOUNTER — Ambulatory Visit (HOSPITAL_COMMUNITY)
Admission: RE | Admit: 2011-04-24 | Discharge: 2011-04-24 | Disposition: A | Discharge status: Home / Self Care | Payer: BC Managed Care – PPO | Source: Ambulatory Visit | Attending: Urology | Admitting: Urology

## 2011-04-24 ENCOUNTER — Other Ambulatory Visit (HOSPITAL_COMMUNITY): Payer: Self-pay | Admitting: Urology

## 2011-04-24 DIAGNOSIS — Z0389 Encounter for observation for other suspected diseases and conditions ruled out: Secondary | ICD-10-CM | POA: Insufficient documentation

## 2011-04-24 DIAGNOSIS — R52 Pain, unspecified: Secondary | ICD-10-CM

## 2011-04-24 DIAGNOSIS — M5380 Other specified dorsopathies, site unspecified: Secondary | ICD-10-CM | POA: Insufficient documentation

## 2011-05-01 ENCOUNTER — Ambulatory Visit (INDEPENDENT_AMBULATORY_CARE_PROVIDER_SITE_OTHER): Payer: BC Managed Care – PPO | Admitting: Internal Medicine

## 2011-05-01 DIAGNOSIS — Z23 Encounter for immunization: Secondary | ICD-10-CM

## 2011-07-01 IMAGING — MG MM DIGITAL SCREENING
4 series · 4 of 4 positions shown · non-contrast
Comparison: Prior studies.

DG SCREEN MAMMOGRAM BILATERAL
Bilateral CC and MLO view(s) were taken.
Technologist: Lindo Gb, RT RM
Prior study comparison: December 12, 2006, DG screen mammogram bilateral.  June 22, 2005, 
bilateral screening mammogram.

DIGITAL SCREENING MAMMOGRAM WITH CAD:

[R CC]
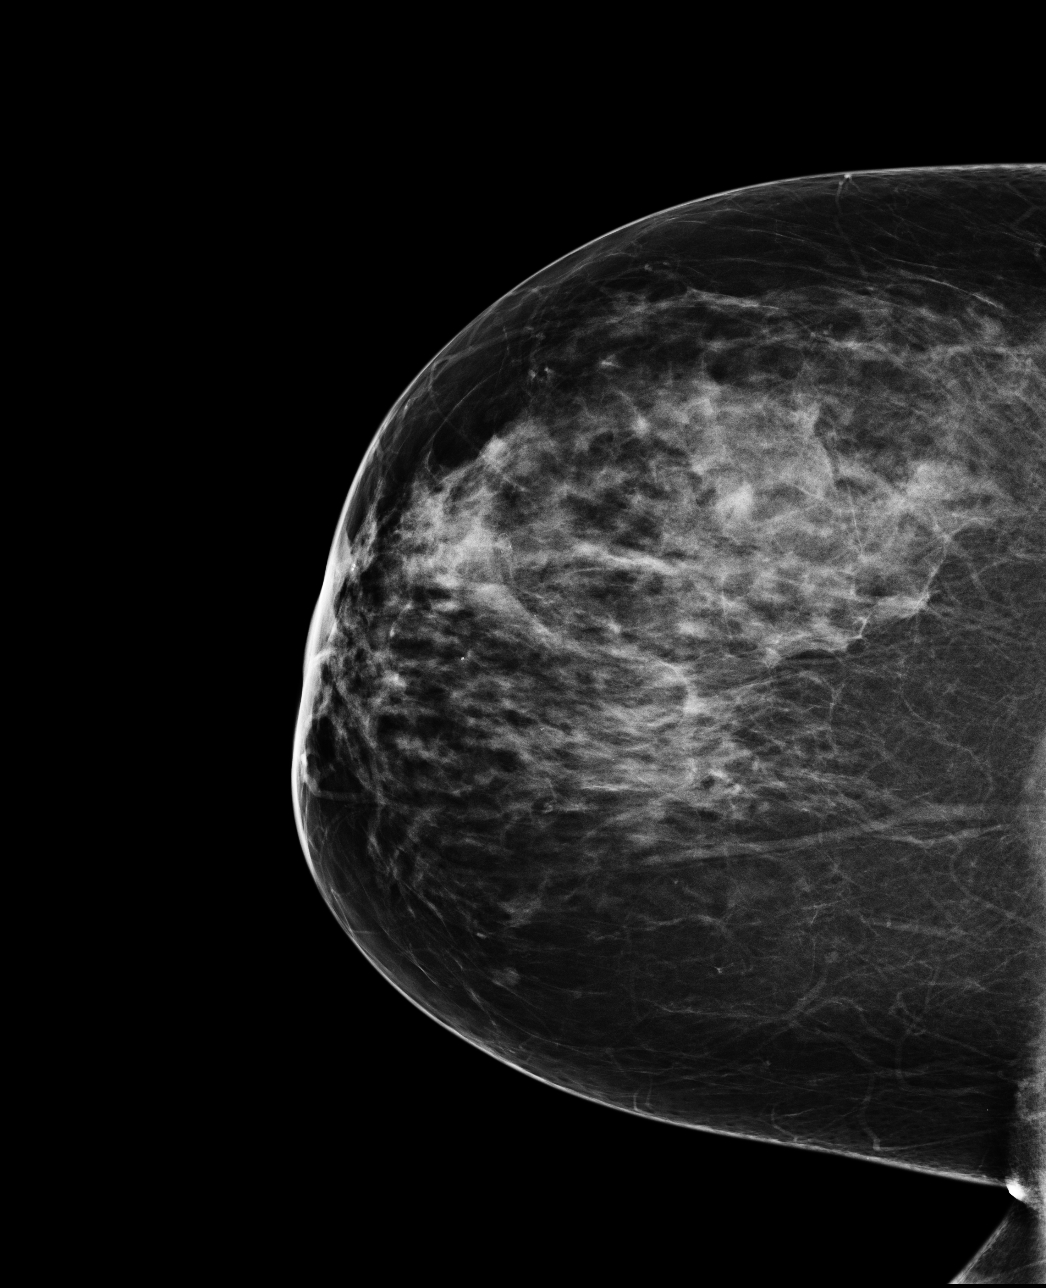

[R MLO]
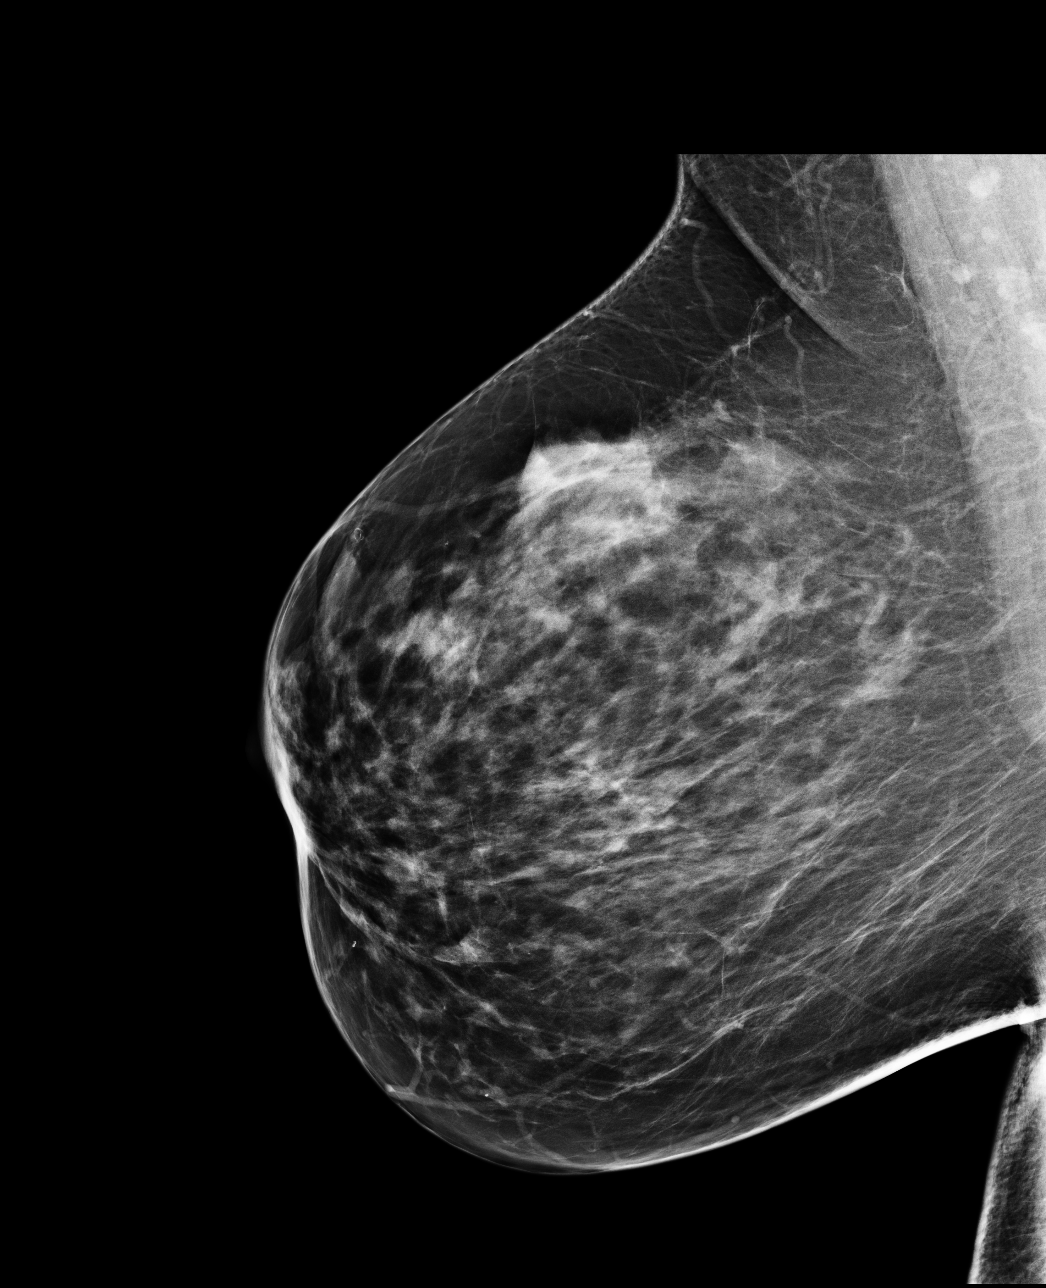

[L CC]
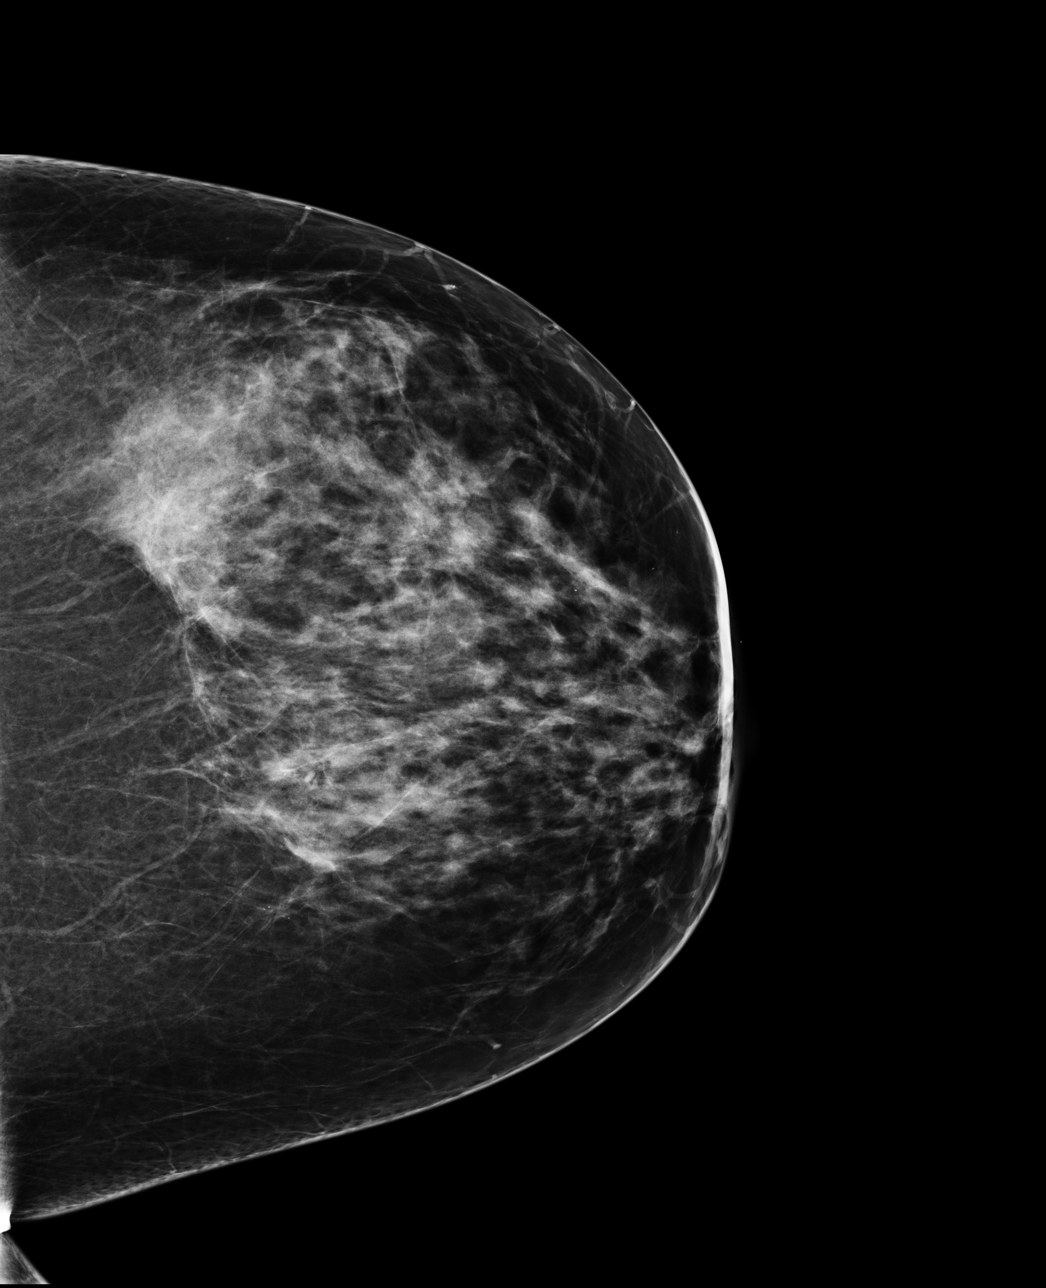

[L MLO]
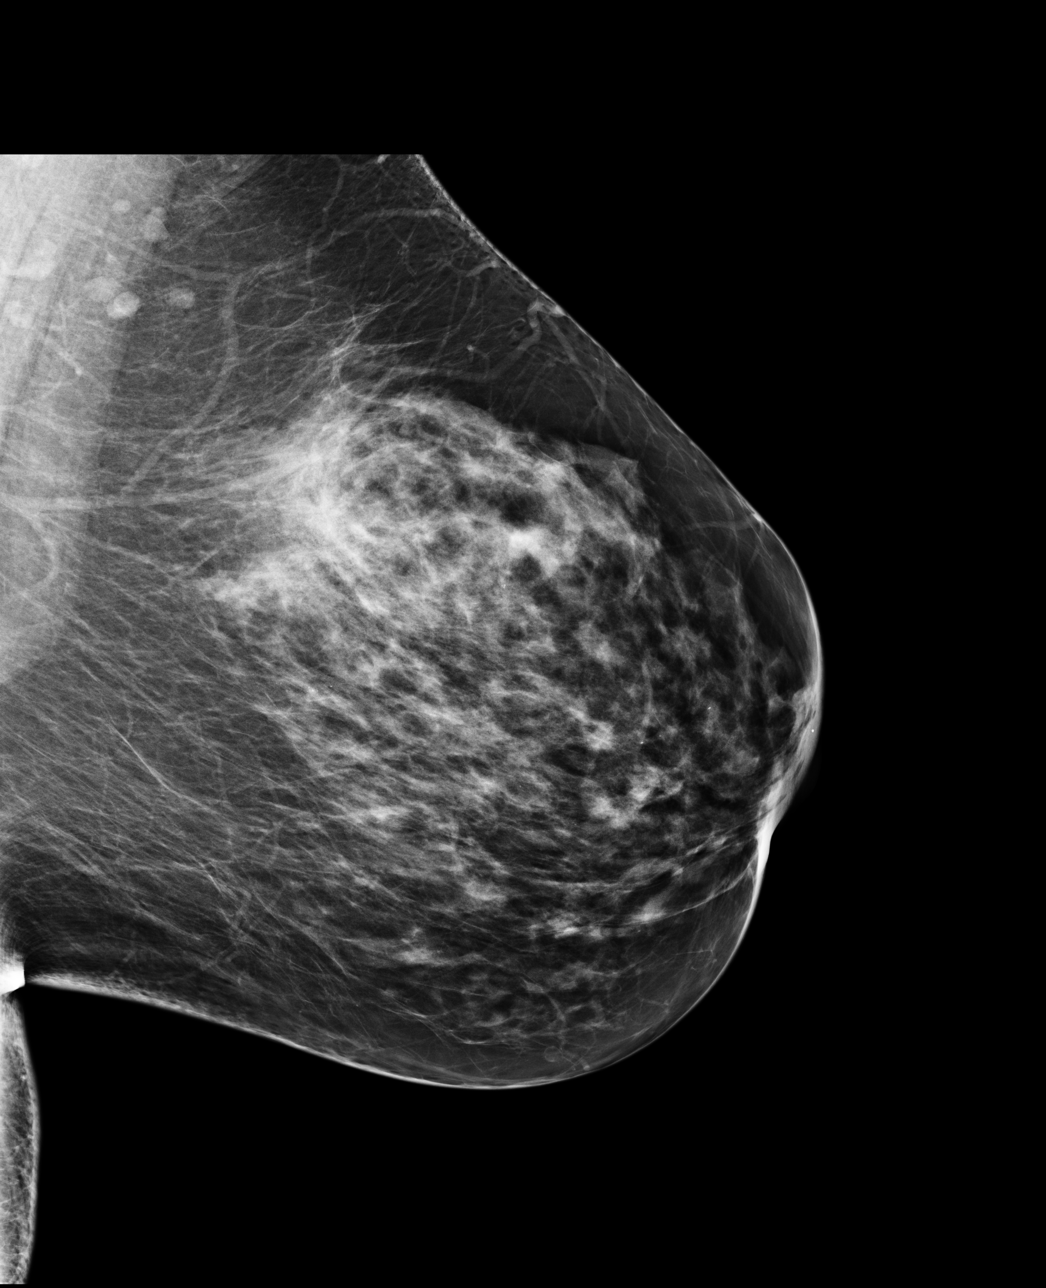

[4 of 4 positions shown; findings below may reference images not displayed]

There are scattered fibroglandular densities.  There are calcifications in the left breast.  
Characterization with magnification views is recommended.  The right breast is unremarkable.

Images were processed with CAD.
IMPRESSION: Calcifications, left breast.  Additional evaluation is indicated.  The patient will be contacted 
for additional studies and a supplemental report will follow.

ASSESSMENT: Need additional imaging evaluation and/or prior mammograms for comparison - BI-RADS 0 -
Left

Further imaging of the left breast.
,

## 2011-07-31 ENCOUNTER — Other Ambulatory Visit: Payer: Self-pay | Admitting: Internal Medicine

## 2011-07-31 DIAGNOSIS — Z1231 Encounter for screening mammogram for malignant neoplasm of breast: Secondary | ICD-10-CM

## 2011-08-15 ENCOUNTER — Ambulatory Visit: Payer: BC Managed Care – PPO

## 2011-08-16 ENCOUNTER — Ambulatory Visit
Admission: RE | Admit: 2011-08-16 | Discharge: 2011-08-16 | Disposition: A | Discharge status: Home / Self Care | Payer: BC Managed Care – PPO | Source: Ambulatory Visit | Attending: Internal Medicine | Admitting: Internal Medicine

## 2011-08-16 DIAGNOSIS — Z1231 Encounter for screening mammogram for malignant neoplasm of breast: Secondary | ICD-10-CM

## 2011-12-29 ENCOUNTER — Other Ambulatory Visit (INDEPENDENT_AMBULATORY_CARE_PROVIDER_SITE_OTHER): Payer: BC Managed Care – PPO

## 2011-12-29 DIAGNOSIS — Z0000 Encounter for general adult medical examination without abnormal findings: Secondary | ICD-10-CM

## 2011-12-29 LAB — POCT URINALYSIS DIPSTICK
Nitrite, UA: NEGATIVE
Protein, UA: NEGATIVE
Spec Grav, UA: 1.015
Urobilinogen, UA: 0.2

## 2011-12-29 LAB — HEPATIC FUNCTION PANEL
ALT: 39 U/L — ABNORMAL HIGH (ref 0–35)
AST: 30 U/L (ref 0–37)
Alkaline Phosphatase: 65 U/L (ref 39–117)
Bilirubin, Direct: 0.1 mg/dL (ref 0.0–0.3)
Total Bilirubin: 0.6 mg/dL (ref 0.3–1.2)
Total Protein: 7.6 g/dL (ref 6.0–8.3)

## 2011-12-29 LAB — BASIC METABOLIC PANEL
BUN: 17 mg/dL (ref 6–23)
Chloride: 103 mEq/L (ref 96–112)
Creatinine, Ser: 0.9 mg/dL (ref 0.4–1.2)
GFR: 69.96 mL/min (ref >60.00)

## 2011-12-29 LAB — LIPID PANEL
Cholesterol: 168 mg/dL (ref 0–200)
LDL Cholesterol: 93 mg/dL (ref 0–99)
Total CHOL/HDL Ratio: 3
VLDL: 18.2 mg/dL (ref 0.0–40.0)

## 2011-12-29 LAB — CBC WITH DIFFERENTIAL/PLATELET
Basophils Relative: 0.6 % (ref 0.0–3.0)
Eosinophils Relative: 4 % (ref 0.0–5.0)
Lymphocytes Relative: 33.6 % (ref 12.0–46.0)
Monocytes Absolute: 0.4 10*3/uL (ref 0.1–1.0)
Monocytes Relative: 5.6 % (ref 3.0–12.0)
Neutrophils Relative %: 56.2 % (ref 43.0–77.0)
Platelets: 216 10*3/uL (ref 150.0–400.0)
RBC: 4.66 Mil/uL (ref 3.87–5.11)
WBC: 6.9 10*3/uL (ref 4.5–10.5)

## 2012-01-10 ENCOUNTER — Ambulatory Visit (INDEPENDENT_AMBULATORY_CARE_PROVIDER_SITE_OTHER): Payer: BC Managed Care – PPO | Admitting: Internal Medicine

## 2012-01-10 ENCOUNTER — Encounter: Payer: Self-pay | Admitting: Internal Medicine

## 2012-01-10 VITALS — BP 132/88 | HR 90 | Temp 98.4°F | Wt 169.0 lb

## 2012-01-10 DIAGNOSIS — Z0000 Encounter for general adult medical examination without abnormal findings: Secondary | ICD-10-CM

## 2012-01-10 DIAGNOSIS — I10 Essential (primary) hypertension: Secondary | ICD-10-CM

## 2012-01-10 NOTE — Progress Notes (Signed)
Patient ID: Rhonda Spence, female   DOB: [birth year], 61 y.o.   MRN: [id number] cpx  Past Medical History  Diagnosis Date  . Hyperlipidemia   . Hypertension   . Chronic kidney disease     stones  . Nephrolithiasis     has required lithotripsy    History   Social History  . Marital Status: Married    Spouse Name: N/A    Number of Children: N/A  . Years of Education: N/A   Occupational History  . Not on file.   Social History Main Topics  . Smoking status: Never Smoker   . Smokeless tobacco: Not on file  . Alcohol Use: Yes  . Drug Use:   . Sexually Active:    Other Topics Concern  . Not on file   Social History Narrative  . No narrative on file    Past Surgical History  Procedure Date  . Cesarean section     x2  . Tonsillectomy   . Adenoidectomy   . Lithotripsy 2011    kimbrough    Family History  Problem Relation Age of Onset  . Hypertension Mother   . Heart failure Mother   . Heart disease Father   . Heart failure Father   . Osteogenesis imperfecta Father   . Osteogenesis imperfecta Sister   . Stroke Daughter   . Breast cancer Sister     Allergies  Allergen Reactions  . Iodine     REACTION: congestion    Current Outpatient Prescriptions on File Prior to Visit  Medication Sig Dispense Refill  . hydrochlorothiazide (HYDRODIURIL) 25 MG tablet Take 1 tablet (25 mg total) by mouth daily.  90 tablet  3  . loratadine (CLARITIN) 10 MG tablet Take 10 mg by mouth daily.        Marland Kitchen losartan (COZAAR) 100 MG tablet Take 1 tablet (100 mg total) by mouth daily.  90 tablet  3  . Multiple Vitamin (MULTIVITAMIN) tablet Take 1 tablet by mouth daily.        . potassium citrate (UROCIT-K) 10 MEQ (1080 MG) SR tablet Take 2 tablets (20 mEq total) by mouth daily.  90 tablet  3  . simvastatin (ZOCOR) 40 MG tablet Take 1 tablet (40 mg total) by mouth at bedtime.  90 tablet  3     patient denies chest pain, shortness of breath, orthopnea. Denies lower extremity edema,  abdominal pain, change in appetite, change in bowel movements. Patient denies rashes, musculoskeletal complaints. No other specific complaints in a complete review of systems.   BP 132/88  Pulse 90  Temp 98.4 F (36.9 C) (Oral)  Wt 169 lb (76.658 kg)  Well-developed well-nourished female in no acute distress. HEENT exam atraumatic, normocephalic, extraocular muscles are intact. Neck is supple. No jugular venous distention no thyromegaly. Chest clear to auscultation without increased work of breathing. Cardiac exam S1 and S2 are regular. Abdominal exam active bowel sounds, soft, nontender. Extremities no edema. Neurologic exam she is alert without any motor sensory deficits. Gait is normal.  Well Visit- health maint UTD Deconditioning- encouraged daily physical activity.

## 2012-01-22 ENCOUNTER — Encounter: Payer: Self-pay | Admitting: Gastroenterology

## 2012-01-22 ENCOUNTER — Encounter: Payer: Self-pay | Admitting: Internal Medicine

## 2012-01-26 ENCOUNTER — Other Ambulatory Visit: Payer: Self-pay | Admitting: Internal Medicine

## 2012-02-06 ENCOUNTER — Encounter: Payer: Self-pay | Admitting: Gastroenterology

## 2012-02-14 ENCOUNTER — Other Ambulatory Visit: Payer: Self-pay | Admitting: Internal Medicine

## 2012-02-15 ENCOUNTER — Other Ambulatory Visit: Payer: Self-pay | Admitting: *Deleted

## 2012-02-15 MED ORDER — LOSARTAN POTASSIUM 100 MG PO TABS: 100.0000 mg | ORAL_TABLET | Freq: Every day | ORAL | Status: DC

## 2012-03-15 ENCOUNTER — Encounter: Payer: Self-pay | Admitting: Gastroenterology

## 2012-03-15 ENCOUNTER — Ambulatory Visit (AMBULATORY_SURGERY_CENTER): Payer: BC Managed Care – PPO

## 2012-03-15 VITALS — Ht 61.5 in | Wt 171.8 lb

## 2012-03-15 DIAGNOSIS — Z1211 Encounter for screening for malignant neoplasm of colon: Secondary | ICD-10-CM

## 2012-03-15 MED ORDER — MOVIPREP 100 G PO SOLR: 1.0000 | Freq: Once | ORAL | Status: DC

## 2012-03-29 ENCOUNTER — Encounter: Payer: Self-pay | Admitting: Gastroenterology

## 2012-03-29 ENCOUNTER — Ambulatory Visit (AMBULATORY_SURGERY_CENTER): Payer: BC Managed Care – PPO | Admitting: Gastroenterology

## 2012-03-29 VITALS — BP 124/76 | HR 77 | Temp 97.6°F | Resp 12 | Ht 60.0 in | Wt 171.0 lb

## 2012-03-29 DIAGNOSIS — D126 Benign neoplasm of colon, unspecified: Secondary | ICD-10-CM

## 2012-03-29 DIAGNOSIS — Z1211 Encounter for screening for malignant neoplasm of colon: Secondary | ICD-10-CM

## 2012-03-29 MED ORDER — SODIUM CHLORIDE 0.9 % IV SOLN: 500.0000 mL | INTRAVENOUS | Status: DC

## 2012-03-29 NOTE — Op Note (Signed)
[facility] [address] [place], [address]   COLONOSCOPY PROCEDURE REPORT  PATIENT: Rhonda Spence, Rhonda Spence.  MR#: [id number] BIRTHDATE: October 23, 1950 , 60  yrs. old GENDER: Female ENDOSCOPIST: Meryl Dare, MD, [facility] PROCEDURE DATE:  03/29/2012 PROCEDURE:   Colonoscopy with snare polypectomy ASA CLASS:   Class II INDICATIONS:average risk screening. MEDICATIONS: MAC sedation, administered by CRNA and propofol (Diprivan) 240mg  IV DESCRIPTION OF PROCEDURE:   After the risks benefits and alternatives of the procedure were thoroughly explained, informed consent was obtained.  A digital rectal exam revealed no abnormalities of the rectum.   The LB [id number]  endoscope was introduced through the anus and advanced to the cecum, which was identified by both the appendix and ileocecal valve. No adverse events experienced.   The quality of the prep was adequate, using MoviPrep  The instrument was then slowly withdrawn as the colon was fully examined.  COLON FINDINGS: A sessile polyp measuring 5 mm in size was found in the sigmoid colon.  A polypectomy was performed with a cold snare. The resection was complete and the polyp tissue was completely retrieved.   Mild diverticulosis was noted in the ascending colon, at the hepatic flexure, and in the transverse colon.   Moderate diverticulosis was noted in the descending colon and sigmoid colon. The colon was otherwise normal.  There was no diverticulosis, inflammation, polyps or cancers unless previously stated. Retroflexed views revealed no abnormalities. The time to cecum=2 minutes 31 seconds.  Withdrawal time=10 minutes 50 seconds.  The scope was withdrawn and the procedure completed.  COMPLICATIONS: There were no complications.  ENDOSCOPIC IMPRESSION: 1.   Sessile polyp measuring 5 mm in the sigmoid colon; polypectomy performed with a cold snare 2.   Mild diverticulosis was noted in the ascending colon, at  the hepatic flexure, and in the transverse colon 3.   Moderate diverticulosis was noted in the descending colon and sigmoid colon  RECOMMENDATIONS: 1.  Await pathology results 2.  High fiber diet with liberal fluid intake.   eSigned:  Meryl Dare, MD, [facility] 03/29/2012 11:28 AM

## 2012-03-29 NOTE — Patient Instructions (Signed)
HIGH FIBER DIET WITH LIBERAL FLUID INTAKE.  YOU HAD AN ENDOSCOPIC PROCEDURE TODAY AT THE [facility]: Refer to the procedure report that was given to you for any specific questions about what was found during the examination.  If the procedure report does not answer your questions, please call your gastroenterologist to clarify.  If you requested that your care partner not be given the details of your procedure findings, then the procedure report has been included in a sealed envelope for you to review at your convenience later.  YOU SHOULD EXPECT: Some feelings of bloating in the abdomen. Passage of more gas than usual.  Walking can help get rid of the air that was put into your GI tract during the procedure and reduce the bloating. If you had a lower endoscopy (such as a colonoscopy or flexible sigmoidoscopy) you may notice spotting of blood in your stool or on the toilet paper. If you underwent a bowel prep for your procedure, then you may not have a normal bowel movement for a few days.  DIET: Your first meal following the procedure should be a light meal and then it is ok to progress to your normal diet.  A half-sandwich or bowl of soup is an example of a good first meal.  Heavy or fried foods are harder to digest and may make you feel nauseous or bloated.  Likewise meals heavy in dairy and vegetables can cause extra gas to form and this can also increase the bloating.  Drink plenty of fluids but you should avoid alcoholic beverages for 24 hours.  ACTIVITY: Your care partner should take you home directly after the procedure.  You should plan to take it easy, moving slowly for the rest of the day.  You can resume normal activity the day after the procedure however you should NOT DRIVE or use heavy machinery for 24 hours (because of the sedation medicines used during the test).    SYMPTOMS TO REPORT IMMEDIATELY: A gastroenterologist can be reached at any hour.  During normal business  hours, 8:30 AM to 5:00 PM Monday through Friday, call [phone].  After hours and on weekends, please call the GI answering service at [phone] who will take a message and have the physician on call contact you.   Following lower endoscopy (colonoscopy or flexible sigmoidoscopy):  Excessive amounts of blood in the stool  Significant tenderness or worsening of abdominal pains  Swelling of the abdomen that is new, acute  Fever of 100F or higher FOLLOW UP: If any biopsies were taken you will be contacted by phone or by letter within the next 1-3 weeks.  Call your gastroenterologist if you have not heard about the biopsies in 3 weeks.  Our staff will call the home number listed on your records the next business day following your procedure to check on you and address any questions or concerns that you may have at that time regarding the information given to you following your procedure. This is a courtesy call and so if there is no answer at the home number and we have not heard from you through the emergency physician on call, we will assume that you have returned to your regular daily activities without incident.  SIGNATURES/CONFIDENTIALITY: You and/or your care partner have signed paperwork which will be entered into your electronic medical record.  These signatures attest to the fact that that the information above on your After Visit Summary has been reviewed and is understood.    Full responsibility of the confidentiality of this discharge information lies with you and/or your care-partner.  

## 2012-03-29 NOTE — Progress Notes (Signed)
Patient did not experience any of the following events: a burn prior to discharge; a fall within the facility; wrong site/side/patient/procedure/implant event; or a hospital transfer or hospital admission upon discharge from the facility. (G8907) Patient did not have preoperative order for IV antibiotic SSI prophylaxis. (G8918)  

## 2012-04-01 ENCOUNTER — Telehealth: Payer: Self-pay | Admitting: *Deleted

## 2012-04-01 NOTE — Telephone Encounter (Signed)
  Follow up Call-  Call back number 03/29/2012  Post procedure Call Back phone  # [phone]  Permission to leave phone message Yes     Patient questions:  Do you have a fever, pain , or abdominal swelling? no Pain Score  0 *  Have you tolerated food without any problems? yes  Have you been able to return to your normal activities? yes  Do you have any questions about your discharge instructions: Diet   no Medications  no Follow up visit  no  Do you have questions or concerns about your Care? no  Actions: * If pain score is 4 or above: No action needed, pain <4.

## 2012-04-07 ENCOUNTER — Encounter: Payer: Self-pay | Admitting: Gastroenterology

## 2012-04-24 ENCOUNTER — Other Ambulatory Visit: Payer: Self-pay | Admitting: Internal Medicine

## 2013-01-31 ENCOUNTER — Other Ambulatory Visit: Payer: Self-pay | Admitting: Internal Medicine

## 2013-02-07 ENCOUNTER — Ambulatory Visit (INDEPENDENT_AMBULATORY_CARE_PROVIDER_SITE_OTHER): Payer: BC Managed Care – PPO

## 2013-02-07 DIAGNOSIS — Z23 Encounter for immunization: Secondary | ICD-10-CM

## 2013-05-07 ENCOUNTER — Other Ambulatory Visit (INDEPENDENT_AMBULATORY_CARE_PROVIDER_SITE_OTHER): Payer: BC Managed Care – PPO

## 2013-05-07 DIAGNOSIS — Z0000 Encounter for general adult medical examination without abnormal findings: Secondary | ICD-10-CM

## 2013-05-07 LAB — BASIC METABOLIC PANEL
BUN: 21 mg/dL (ref 6–23)
Calcium: 9.5 mg/dL (ref 8.4–10.5)
GFR: 71.55 mL/min (ref >60.00)
Glucose, Bld: 117 mg/dL — ABNORMAL HIGH (ref 70–99)
Sodium: 140 mEq/L (ref 135–145)

## 2013-05-07 LAB — POCT URINALYSIS DIPSTICK
Bilirubin, UA: NEGATIVE
Glucose, UA: NEGATIVE
Ketones, UA: NEGATIVE
Spec Grav, UA: 1.02

## 2013-05-07 LAB — CBC WITH DIFFERENTIAL/PLATELET
Basophils Absolute: 0 10*3/uL (ref 0.0–0.1)
Hemoglobin: 14.7 g/dL (ref 12.0–15.0)
Lymphocytes Relative: 31.4 % (ref 12.0–46.0)
Monocytes Relative: 5.3 % (ref 3.0–12.0)
Platelets: 237 10*3/uL (ref 150.0–400.0)
RDW: 12.1 % (ref 11.5–14.6)
WBC: 8 10*3/uL (ref 4.5–10.5)

## 2013-05-07 LAB — LIPID PANEL
HDL: 49.6 mg/dL (ref >39.00)
LDL Cholesterol: 107 mg/dL — ABNORMAL HIGH (ref 0–99)
Total CHOL/HDL Ratio: 4
Triglycerides: 108 mg/dL (ref 0.0–149.0)

## 2013-05-07 LAB — HEPATIC FUNCTION PANEL
AST: 20 U/L (ref 0–37)
Alkaline Phosphatase: 66 U/L (ref 39–117)
Total Bilirubin: 0.8 mg/dL (ref 0.3–1.2)

## 2013-05-09 ENCOUNTER — Other Ambulatory Visit: Payer: BC Managed Care – PPO

## 2013-05-16 ENCOUNTER — Other Ambulatory Visit (HOSPITAL_COMMUNITY)
Admission: RE | Admit: 2013-05-16 | Discharge: 2013-05-16 | Disposition: A | Discharge status: Home / Self Care | Payer: BC Managed Care – PPO | Source: Ambulatory Visit | Attending: Internal Medicine | Admitting: Internal Medicine

## 2013-05-16 ENCOUNTER — Ambulatory Visit (INDEPENDENT_AMBULATORY_CARE_PROVIDER_SITE_OTHER): Payer: BC Managed Care – PPO | Admitting: Internal Medicine

## 2013-05-16 ENCOUNTER — Encounter: Payer: Self-pay | Admitting: Internal Medicine

## 2013-05-16 ENCOUNTER — Ambulatory Visit (INDEPENDENT_AMBULATORY_CARE_PROVIDER_SITE_OTHER)
Admission: RE | Admit: 2013-05-16 | Discharge: 2013-05-16 | Disposition: A | Discharge status: Home / Self Care | Payer: BC Managed Care – PPO | Source: Ambulatory Visit | Attending: Internal Medicine | Admitting: Internal Medicine

## 2013-05-16 VITALS — BP 134/74 | HR 92 | Temp 98.5°F | Ht 61.0 in | Wt 170.0 lb

## 2013-05-16 DIAGNOSIS — M549 Dorsalgia, unspecified: Secondary | ICD-10-CM

## 2013-05-16 DIAGNOSIS — Z01419 Encounter for gynecological examination (general) (routine) without abnormal findings: Secondary | ICD-10-CM | POA: Insufficient documentation

## 2013-05-16 DIAGNOSIS — Z0000 Encounter for general adult medical examination without abnormal findings: Secondary | ICD-10-CM

## 2013-05-16 NOTE — Progress Notes (Signed)
Pre visit review using our clinic review tool, if applicable. No additional management support is needed unless otherwise documented below in the visit note. 

## 2013-05-16 NOTE — Progress Notes (Signed)
cpx  She had shingles- 4 years ago. She notes some right sided pain in that area. She also had lithotripsy in the same area. Back pain has been ongoing (intermittent) for 1.5 years.   Past Medical History  Diagnosis Date  . Hyperlipidemia   . Hypertension   . Chronic kidney disease     stones  . Nephrolithiasis     has required lithotripsy    History   Social History  . Marital Status: Married    Spouse Name: N/A    Number of Children: N/A  . Years of Education: N/A   Occupational History  . Not on file.   Social History Main Topics  . Smoking status: Never Smoker   . Smokeless tobacco: Never Used  . Alcohol Use: Yes  . Drug Use: No  . Sexual Activity: Not on file   Other Topics Concern  . Not on file   Social History Narrative  . No narrative on file    Past Surgical History  Procedure Laterality Date  . Cesarean section      x2  . Tonsillectomy    . Adenoidectomy    . Lithotripsy  2011    kimbrough  . Widom teeth extraction  1976    Family History  Problem Relation Age of Onset  . Hypertension Mother   . Heart failure Mother   . Heart disease Father   . Heart failure Father   . Osteogenesis imperfecta Father   . Osteogenesis imperfecta Sister   . Stroke Daughter   . Breast cancer Sister   . Colon cancer Neg Hx     Allergies  Allergen Reactions  . Iodine     REACTION: congestion    Current Outpatient Prescriptions on File Prior to Visit  Medication Sig Dispense Refill  . b complex vitamins tablet Take 1 tablet by mouth daily.      . hydrochlorothiazide (HYDRODIURIL) 25 MG tablet TAKE 1 TABLET DAILY  90 tablet  0  . loratadine (CLARITIN) 10 MG tablet Take 10 mg by mouth daily.        Marland Kitchen losartan (COZAAR) 100 MG tablet TAKE 1 TABLET DAILY  90 tablet  0  . potassium citrate (UROCIT-K) 10 MEQ (1080 MG) SR tablet TAKE 2 TABLETS(=20MEQ)     DAILY  180 each  3  . simvastatin (ZOCOR) 40 MG tablet TAKE 1 TABLET AT BEDTIME  90 tablet  0   No current  facility-administered medications on file prior to visit.     patient denies chest pain, shortness of breath, orthopnea. Denies lower extremity edema, abdominal pain, change in appetite, change in bowel movements. Patient denies rashes, musculoskeletal complaints. No other specific complaints in a complete review of systems.   BP 134/74  Pulse 92  Temp(Src) 98.5 F (36.9 C) (Oral)  Ht 5\' 1"  (1.549 m)  Wt 170 lb (77.111 kg)  BMI 32.14 kg/m2  Well-developed well-nourished female in no acute distress. HEENT exam atraumatic, normocephalic, extraocular muscles are intact. Neck is supple. No jugular venous distention no thyromegaly. Chest clear to auscultation without increased work of breathing. Cardiac exam S1 and S2 are regular. Abdominal exam active bowel sounds, soft, nontender. Extremities no edema. Neurologic exam she is alert without any motor sensory deficits. Gait is normal.  Well visit- heatlh maint UTD

## 2013-05-20 DIAGNOSIS — H40003 Preglaucoma, unspecified, bilateral: Secondary | ICD-10-CM | POA: Insufficient documentation

## 2013-05-21 ENCOUNTER — Other Ambulatory Visit: Payer: Self-pay | Admitting: Internal Medicine

## 2013-05-21 DIAGNOSIS — M549 Dorsalgia, unspecified: Secondary | ICD-10-CM

## 2013-05-21 MED ORDER — SIMVASTATIN 40 MG PO TABS: ORAL_TABLET | ORAL | Status: DC

## 2013-05-21 MED ORDER — POTASSIUM CITRATE ER 10 MEQ (1080 MG) PO TBCR: EXTENDED_RELEASE_TABLET | ORAL | Status: DC

## 2013-05-21 MED ORDER — HYDROCHLOROTHIAZIDE 25 MG PO TABS: ORAL_TABLET | ORAL | Status: DC

## 2013-05-21 MED ORDER — LOSARTAN POTASSIUM 100 MG PO TABS: ORAL_TABLET | ORAL | Status: DC

## 2013-06-27 ENCOUNTER — Ambulatory Visit (INDEPENDENT_AMBULATORY_CARE_PROVIDER_SITE_OTHER): Payer: BC Managed Care – PPO | Admitting: Family

## 2013-06-27 ENCOUNTER — Encounter: Payer: Self-pay | Admitting: Family

## 2013-06-27 VITALS — BP 142/88 | HR 108 | Temp 98.9°F | Wt 170.0 lb

## 2013-06-27 DIAGNOSIS — J069 Acute upper respiratory infection, unspecified: Secondary | ICD-10-CM

## 2013-06-27 DIAGNOSIS — J209 Acute bronchitis, unspecified: Secondary | ICD-10-CM

## 2013-06-27 MED ORDER — METHYLPREDNISOLONE 4 MG PO KIT: PACK | ORAL | Status: AC

## 2013-06-27 MED ORDER — METHYLPREDNISOLONE 4 MG PO KIT: PACK | ORAL | Status: DC

## 2013-06-27 MED ORDER — GUAIFENESIN-CODEINE 100-10 MG/5ML PO SYRP: 5.0000 mL | ORAL_SOLUTION | Freq: Three times a day (TID) | ORAL | Status: DC | PRN

## 2013-06-27 NOTE — Patient Instructions (Signed)

## 2013-06-27 NOTE — Progress Notes (Signed)
   Subjective:    Patient ID: Rhonda Spence, female    DOB: [birth year], 63 y.o.   MRN: [id number]  HPI 63 year old white female, nonsmoker is in today with complaints of cough, congestion x5 days. Today she feels somewhat better. Has had wheezing and shortness of breath. Reports a tickle in her throat. Continue over-the-counter Mucinex DM without much relief.   Review of Systems  Constitutional: Negative.  Negative for fever and fatigue.  HENT: Positive for congestion and sneezing.   Respiratory: Positive for cough and wheezing. Negative for apnea and stridor.   Cardiovascular: Negative.   Musculoskeletal: Negative.   Skin: Negative.   Allergic/Immunologic: Negative.   Psychiatric/Behavioral: Negative.        Objective:   Physical Exam  Constitutional: She is oriented to person, place, and time. She appears well-developed and well-nourished.  HENT:  Right Ear: External ear normal.  Left Ear: External ear normal.  Nose: Nose normal.  Mouth/Throat: Oropharynx is clear and moist.  Neck: Normal range of motion. Neck supple.  Cardiovascular: Normal rate, regular rhythm and normal heart sounds.   Pulmonary/Chest: Effort normal. She has wheezes.  Mild expiratory wheezing  Musculoskeletal: Normal range of motion.  Neurological: She is alert and oriented to person, place, and time.  Skin: Skin is warm and dry.  Psychiatric: She has a normal mood and affect.          Assessment & Plan:  Rhonda Spence was seen today for cough.  Diagnoses and associated orders for this visit:  Acute upper respiratory infections of unspecified site  Acute bronchitis  Other Orders - Discontinue: methylPREDNISolone (MEDROL DOSEPAK) 4 MG tablet; follow package directions - guaiFENesin-codeine (CHERATUSSIN AC) 100-10 MG/5ML syrup; Take 5 mLs by mouth 3 (three) times daily as needed. - methylPREDNISolone (MEDROL DOSEPAK) 4 MG tablet; follow package directions    call the office with any questions  or concerns. Recheck as scheduled, and as needed.

## 2013-08-13 ENCOUNTER — Telehealth: Payer: Self-pay | Admitting: Internal Medicine

## 2013-08-13 DIAGNOSIS — Z1239 Encounter for other screening for malignant neoplasm of breast: Secondary | ICD-10-CM

## 2013-08-13 NOTE — Telephone Encounter (Signed)
Does pt need an OV? 

## 2013-08-13 NOTE — Telephone Encounter (Signed)
Breast center advise pt that she needs a referral pt has been having itchy breast //

## 2013-08-13 NOTE — Telephone Encounter (Signed)
Ok for her to see APP

## 2013-08-14 NOTE — Telephone Encounter (Signed)
Please schedule an appt with [facility]

## 2013-08-15 NOTE — Telephone Encounter (Signed)
Pt states she does not need an office visit   The breast center is requesting that she have a referral so that they can do a more in depth exam due to her itching breast when she comes for her mamo,however pt can not schedule until she has referral

## 2013-08-19 NOTE — Telephone Encounter (Signed)
Ok to refer.

## 2013-08-19 NOTE — Telephone Encounter (Signed)
Referral order placed.

## 2013-09-10 ENCOUNTER — Telehealth: Payer: Self-pay | Admitting: Internal Medicine

## 2013-09-10 DIAGNOSIS — Z1239 Encounter for other screening for malignant neoplasm of breast: Secondary | ICD-10-CM

## 2013-09-10 NOTE — Telephone Encounter (Signed)
Pre breast center WRONG DIAGNOSIS. PT NEEDS A DIAGNOSTIC MAMMO. Pt is also  calling back would like a call from cindy [phone] to explain what order is needed

## 2013-09-12 NOTE — Telephone Encounter (Signed)
Will you check into this

## 2013-09-17 NOTE — Telephone Encounter (Signed)
Left message for pt to call back  °

## 2013-09-17 NOTE — Telephone Encounter (Signed)
Pt mentioned to Breast Center that she had itching of both breast but she thought it was her deodorant  but the breast center wanted to make sure and do a diagnostic screening

## 2013-09-18 NOTE — Telephone Encounter (Signed)
ok 

## 2013-09-19 NOTE — Telephone Encounter (Signed)
Referral order updated. 

## 2013-09-19 NOTE — Addendum Note (Signed)
Addended by: Townsend Roger D on: 09/19/2013 10:39 AM   Modules accepted: Orders

## 2013-09-23 ENCOUNTER — Other Ambulatory Visit: Payer: Self-pay | Admitting: Internal Medicine

## 2013-09-23 DIAGNOSIS — R234 Changes in skin texture: Secondary | ICD-10-CM

## 2013-09-30 ENCOUNTER — Ambulatory Visit
Admission: RE | Admit: 2013-09-30 | Discharge: 2013-09-30 | Disposition: A | Discharge status: Home / Self Care | Payer: BC Managed Care – PPO | Source: Ambulatory Visit | Attending: Internal Medicine | Admitting: Internal Medicine

## 2013-09-30 ENCOUNTER — Other Ambulatory Visit: Payer: Self-pay | Admitting: Internal Medicine

## 2013-09-30 ENCOUNTER — Encounter (INDEPENDENT_AMBULATORY_CARE_PROVIDER_SITE_OTHER): Payer: Self-pay

## 2013-09-30 DIAGNOSIS — L299 Pruritus, unspecified: Secondary | ICD-10-CM

## 2013-09-30 DIAGNOSIS — R234 Changes in skin texture: Secondary | ICD-10-CM

## 2013-09-30 DIAGNOSIS — Z1239 Encounter for other screening for malignant neoplasm of breast: Secondary | ICD-10-CM

## 2014-04-08 ENCOUNTER — Telehealth: Payer: Self-pay | Admitting: Internal Medicine

## 2014-04-08 NOTE — Telephone Encounter (Signed)
Pharmacy updated.

## 2014-04-08 NOTE — Telephone Encounter (Signed)
Patient's spouse needs the pharmacy information changed to [facility].  Pt's spouse is going to have patient call to set-up an appointment with Dr. Yong Channel.

## 2014-05-05 ENCOUNTER — Other Ambulatory Visit: Payer: Self-pay | Admitting: Internal Medicine

## 2014-05-05 MED ORDER — HYDROCHLOROTHIAZIDE 25 MG PO TABS: ORAL_TABLET | ORAL | Status: DC

## 2014-05-05 MED ORDER — SIMVASTATIN 40 MG PO TABS: ORAL_TABLET | ORAL | Status: DC

## 2014-07-07 DIAGNOSIS — H4011X1 Primary open-angle glaucoma, mild stage: Secondary | ICD-10-CM | POA: Diagnosis not present

## 2014-08-11 ENCOUNTER — Other Ambulatory Visit: Payer: Self-pay | Admitting: Internal Medicine

## 2014-08-11 DIAGNOSIS — E785 Hyperlipidemia, unspecified: Secondary | ICD-10-CM | POA: Diagnosis not present

## 2014-08-11 DIAGNOSIS — I10 Essential (primary) hypertension: Secondary | ICD-10-CM | POA: Diagnosis not present

## 2014-08-11 DIAGNOSIS — Z23 Encounter for immunization: Secondary | ICD-10-CM | POA: Diagnosis not present

## 2014-08-11 DIAGNOSIS — E559 Vitamin D deficiency, unspecified: Secondary | ICD-10-CM | POA: Diagnosis not present

## 2014-08-11 DIAGNOSIS — K219 Gastro-esophageal reflux disease without esophagitis: Secondary | ICD-10-CM | POA: Diagnosis not present

## 2014-08-11 DIAGNOSIS — Z6831 Body mass index (BMI) 31.0-31.9, adult: Secondary | ICD-10-CM | POA: Diagnosis not present

## 2014-08-11 DIAGNOSIS — R7301 Impaired fasting glucose: Secondary | ICD-10-CM | POA: Diagnosis not present

## 2014-08-11 DIAGNOSIS — Z1231 Encounter for screening mammogram for malignant neoplasm of breast: Secondary | ICD-10-CM

## 2014-10-06 ENCOUNTER — Ambulatory Visit: Payer: Self-pay

## 2014-10-06 ENCOUNTER — Ambulatory Visit
Admission: RE | Admit: 2014-10-06 | Discharge: 2014-10-06 | Disposition: A | Discharge status: Home / Self Care | Payer: Medicare Other | Source: Ambulatory Visit | Attending: Internal Medicine | Admitting: Internal Medicine

## 2014-10-06 DIAGNOSIS — Z1231 Encounter for screening mammogram for malignant neoplasm of breast: Secondary | ICD-10-CM | POA: Diagnosis not present

## 2014-10-13 DIAGNOSIS — I10 Essential (primary) hypertension: Secondary | ICD-10-CM | POA: Diagnosis not present

## 2014-10-13 DIAGNOSIS — Z6830 Body mass index (BMI) 30.0-30.9, adult: Secondary | ICD-10-CM | POA: Diagnosis not present

## 2014-10-13 DIAGNOSIS — L237 Allergic contact dermatitis due to plants, except food: Secondary | ICD-10-CM | POA: Diagnosis not present

## 2015-01-12 DIAGNOSIS — H401131 Primary open-angle glaucoma, bilateral, mild stage: Secondary | ICD-10-CM | POA: Insufficient documentation

## 2015-01-12 DIAGNOSIS — H4011X1 Primary open-angle glaucoma, mild stage: Secondary | ICD-10-CM | POA: Diagnosis not present

## 2015-01-12 DIAGNOSIS — H2513 Age-related nuclear cataract, bilateral: Secondary | ICD-10-CM | POA: Diagnosis not present

## 2015-07-13 DIAGNOSIS — H2513 Age-related nuclear cataract, bilateral: Secondary | ICD-10-CM | POA: Diagnosis not present

## 2015-07-13 DIAGNOSIS — H401131 Primary open-angle glaucoma, bilateral, mild stage: Secondary | ICD-10-CM | POA: Diagnosis not present

## 2015-08-25 DIAGNOSIS — I10 Essential (primary) hypertension: Secondary | ICD-10-CM | POA: Diagnosis not present

## 2015-08-25 DIAGNOSIS — E559 Vitamin D deficiency, unspecified: Secondary | ICD-10-CM | POA: Diagnosis not present

## 2015-08-25 DIAGNOSIS — R8299 Other abnormal findings in urine: Secondary | ICD-10-CM | POA: Diagnosis not present

## 2015-08-25 DIAGNOSIS — E784 Other hyperlipidemia: Secondary | ICD-10-CM | POA: Diagnosis not present

## 2015-08-25 DIAGNOSIS — N390 Urinary tract infection, site not specified: Secondary | ICD-10-CM | POA: Diagnosis not present

## 2015-08-25 DIAGNOSIS — R7301 Impaired fasting glucose: Secondary | ICD-10-CM | POA: Diagnosis not present

## 2015-08-27 ENCOUNTER — Other Ambulatory Visit: Payer: Self-pay

## 2015-08-27 DIAGNOSIS — Z1231 Encounter for screening mammogram for malignant neoplasm of breast: Secondary | ICD-10-CM

## 2015-09-01 DIAGNOSIS — R7301 Impaired fasting glucose: Secondary | ICD-10-CM | POA: Diagnosis not present

## 2015-09-01 DIAGNOSIS — Z23 Encounter for immunization: Secondary | ICD-10-CM | POA: Diagnosis not present

## 2015-09-01 DIAGNOSIS — E7800 Pure hypercholesterolemia, unspecified: Secondary | ICD-10-CM | POA: Diagnosis not present

## 2015-09-01 DIAGNOSIS — H409 Unspecified glaucoma: Secondary | ICD-10-CM | POA: Diagnosis not present

## 2015-09-01 DIAGNOSIS — E668 Other obesity: Secondary | ICD-10-CM | POA: Diagnosis not present

## 2015-09-01 DIAGNOSIS — K219 Gastro-esophageal reflux disease without esophagitis: Secondary | ICD-10-CM | POA: Diagnosis not present

## 2015-09-01 DIAGNOSIS — J302 Other seasonal allergic rhinitis: Secondary | ICD-10-CM | POA: Diagnosis not present

## 2015-09-01 DIAGNOSIS — Z6830 Body mass index (BMI) 30.0-30.9, adult: Secondary | ICD-10-CM | POA: Diagnosis not present

## 2015-09-01 DIAGNOSIS — Z0000 Encounter for general adult medical examination without abnormal findings: Secondary | ICD-10-CM | POA: Diagnosis not present

## 2015-09-01 DIAGNOSIS — R05 Cough: Secondary | ICD-10-CM | POA: Diagnosis not present

## 2015-09-01 DIAGNOSIS — E559 Vitamin D deficiency, unspecified: Secondary | ICD-10-CM | POA: Diagnosis not present

## 2015-09-03 DIAGNOSIS — Z1212 Encounter for screening for malignant neoplasm of rectum: Secondary | ICD-10-CM | POA: Diagnosis not present

## 2015-10-08 ENCOUNTER — Ambulatory Visit
Admission: RE | Admit: 2015-10-08 | Discharge: 2015-10-08 | Disposition: A | Discharge status: Home / Self Care | Payer: Medicare Other | Source: Ambulatory Visit

## 2015-10-08 DIAGNOSIS — Z1231 Encounter for screening mammogram for malignant neoplasm of breast: Secondary | ICD-10-CM | POA: Diagnosis not present

## 2016-01-18 DIAGNOSIS — H2513 Age-related nuclear cataract, bilateral: Secondary | ICD-10-CM | POA: Diagnosis not present

## 2016-01-18 DIAGNOSIS — H401131 Primary open-angle glaucoma, bilateral, mild stage: Secondary | ICD-10-CM | POA: Diagnosis not present

## 2016-01-25 DIAGNOSIS — R05 Cough: Secondary | ICD-10-CM | POA: Diagnosis not present

## 2016-01-25 DIAGNOSIS — M542 Cervicalgia: Secondary | ICD-10-CM | POA: Diagnosis not present

## 2016-01-25 DIAGNOSIS — J302 Other seasonal allergic rhinitis: Secondary | ICD-10-CM | POA: Diagnosis not present

## 2016-01-25 DIAGNOSIS — Z6829 Body mass index (BMI) 29.0-29.9, adult: Secondary | ICD-10-CM | POA: Diagnosis not present

## 2016-02-02 ENCOUNTER — Encounter: Payer: Self-pay | Admitting: Internal Medicine

## 2016-02-02 ENCOUNTER — Other Ambulatory Visit (INDEPENDENT_AMBULATORY_CARE_PROVIDER_SITE_OTHER): Payer: Medicare Other

## 2016-02-02 ENCOUNTER — Encounter (INDEPENDENT_AMBULATORY_CARE_PROVIDER_SITE_OTHER): Payer: Self-pay

## 2016-02-02 ENCOUNTER — Ambulatory Visit (INDEPENDENT_AMBULATORY_CARE_PROVIDER_SITE_OTHER): Payer: Medicare Other | Admitting: Internal Medicine

## 2016-02-02 VITALS — BP 114/72 | HR 90 | Ht 62.0 in | Wt 164.8 lb

## 2016-02-02 DIAGNOSIS — R05 Cough: Secondary | ICD-10-CM

## 2016-02-02 DIAGNOSIS — J4521 Mild intermittent asthma with (acute) exacerbation: Secondary | ICD-10-CM

## 2016-02-02 DIAGNOSIS — J45991 Cough variant asthma: Secondary | ICD-10-CM | POA: Insufficient documentation

## 2016-02-02 DIAGNOSIS — H409 Unspecified glaucoma: Secondary | ICD-10-CM | POA: Diagnosis not present

## 2016-02-02 DIAGNOSIS — R059 Cough, unspecified: Secondary | ICD-10-CM

## 2016-02-02 DIAGNOSIS — G4733 Obstructive sleep apnea (adult) (pediatric): Secondary | ICD-10-CM | POA: Insufficient documentation

## 2016-02-02 LAB — NITRIC OXIDE: NITRIC OXIDE: 28

## 2016-02-02 LAB — CBC WITH DIFFERENTIAL/PLATELET
BASOS PCT: 0.5 % (ref 0.0–3.0)
Basophils Absolute: 0 10*3/uL (ref 0.0–0.1)
EOS ABS: 0.2 10*3/uL (ref 0.0–0.7)
Eosinophils Relative: 2.5 % (ref 0.0–5.0)
HCT: 42.8 % (ref 36.0–46.0)
HEMOGLOBIN: 14.7 g/dL (ref 12.0–15.0)
Lymphocytes Relative: 33.1 % (ref 12.0–46.0)
Lymphs Abs: 3 10*3/uL (ref 0.7–4.0)
MCHC: 34.2 g/dL (ref 30.0–36.0)
MCV: 90.2 fl (ref 78.0–100.0)
MONO ABS: 0.6 10*3/uL (ref 0.1–1.0)
Monocytes Relative: 6.4 % (ref 3.0–12.0)
Neutro Abs: 5.2 10*3/uL (ref 1.4–7.7)
Neutrophils Relative %: 57.5 % (ref 43.0–77.0)
PLATELETS: 259 10*3/uL (ref 150.0–400.0)
RBC: 4.75 Mil/uL (ref 3.87–5.11)
RDW: 12.2 % (ref 11.5–15.5)
WBC: 9 10*3/uL (ref 4.0–10.5)

## 2016-02-02 NOTE — Assessment & Plan Note (Addendum)
Describes cough beginning in October 2016 and not clearly related to separate problem of seasonal allergic rhinitis. Childhood history of asthma and cigarette smoke exposure so this may be related no obvious cough related medications. No recognized benefit from a few trials of Ventolin. Does not recognize reflux. FENO 28 suggests a mild allergic airway irritation pattern. Office spirometry normal today. Cough variant asthma or occult reflux are always suspected. Methacholine inhalation challenge may become useful. She thinks a Flovent inhaler may have helped for a while, but she did better during the summer anyway. Plan-very unremarkable exam at this visit so she is going to continue current meds and watch.

## 2016-02-02 NOTE — Assessment & Plan Note (Signed)
Husband raised the issue because of her snoring, but is not with her today. Occasional daytime sleepiness, history of hypertension, long palate on exam are all consistent with diagnosis but nonspecific. Plan-unattended home sleep test

## 2016-02-02 NOTE — Progress Notes (Signed)
02/02/2016-65 year old female never smoker referred courtesy of Dr Leatrice Jewels Tisovec; chronic cough and seasonal allergies. Never had labwork for allergies. Has been going on for about 1 year. Pt states she had asthma as a child. Has glaucoma Using Ventolin HFA, Singulair, Claritin She gives a history of seasonal nasal congestion, itching and watering eyes, watery rhinorrhea for which she has tried [facility] and [facility]. Chronic cough began in October 2016, usually nonproductive without significant wheeze. Worse around cigarette smoke. She got gradually better, then flared again on a trip to [place] in May. She was treated then with Flovent and seemed to improve. Now coughing again, same pattern, since late August. Given Ventolin inhaler she has used 4 times with some benefit. Singulair daily. Specific allergy triggers not identified. Not aware of other allergy issues-rash, food intolerance. Not aware of significant reflux or heartburn. ENT surgery-tonsils Childhood in a smoking family-mother had COPD. 2 sons said to have at least seasonal allergy. Home environment-hardwood floors and carpet, central air conditioning. Small dog. No mold or water issues recognized. Some dyspnea with exertion, sore throat, nasal congestion and postnasal drip. Second issue-she says her husband suggested she seek evaluation with sleep study for possible sleep apnea because she snores loudly. She wakes occasionally feeling that she is not breathing but has not been told of witnessed apnea. Occasional nap without oppressive daytime sleepiness.  CXR-this spring at Dr. Loren Racer office said to be clear  FENO- 02/02/16- 28- mild elevation Office Spirometry 02/02/2016-normal. FVC 2.76/96%, FEV1 2.31/105%, FEV1/FVC 0.84, FEF 25-75 percent 2.59/131%.  Prior to Admission medications   Medication Sig Start Date End Date Taking? Authorizing Provider  b complex vitamins tablet Take 1 tablet by mouth daily.   Yes Historical  Provider, MD  calcium carbonate (CALCIUM 600) 600 MG TABS tablet Take 600 mg by mouth daily with breakfast.   Yes Historical Provider, MD  cholecalciferol (VITAMIN D) 1000 units tablet Take 2,000 Units by mouth daily.   Yes Historical Provider, MD  CINNAMON PO Take 1,000 mg by mouth 2 (two) times daily.   Yes Historical Provider, MD  famotidine (PEPCID) 20 MG tablet Take 20 mg by mouth daily.   Yes Historical Provider, MD  hydrochlorothiazide (HYDRODIURIL) 25 MG tablet TAKE 1 TABLET DAILY 05/05/14  Yes Bruce Lemmie Evens Swords, MD  latanoprost (XALATAN) 0.005 % ophthalmic solution Place 1 drop into both eyes at bedtime.   Yes Historical Provider, MD  loratadine (CLARITIN) 10 MG tablet Take 10 mg by mouth daily.     Yes Historical Provider, MD  losartan (COZAAR) 100 MG tablet TAKE 1 TABLET DAILY 05/05/14  Yes Lisabeth Pick, MD  montelukast (SINGULAIR) 10 MG tablet  01/25/16  Yes Historical Provider, MD  potassium citrate (UROCIT-K) 10 MEQ (1080 MG) SR tablet TAKE 2 TABLETS(=20MEQ)     DAILY 05/21/13  Yes Lisabeth Pick, MD  simvastatin (ZOCOR) 40 MG tablet TAKE 1 TABLET AT BEDTIME 05/05/14  Yes Lisabeth Pick, MD  VENTOLIN HFA 108 (90 Base) MCG/ACT inhaler  01/25/16  Yes Historical Provider, MD   Past Medical History:  Diagnosis Date  . Chronic kidney disease    stones  . Hyperlipidemia   . Hypertension   . Nephrolithiasis    has required lithotripsy   Past Surgical History:  Procedure Laterality Date  . ADENOIDECTOMY    . CESAREAN SECTION     x2  . LITHOTRIPSY  2011   kimbrough  . TONSILLECTOMY    . widom teeth extraction  1976  Family History  Problem Relation Age of Onset  . Hypertension Mother   . Heart failure Mother   . Heart disease Father   . Heart failure Father   . Osteogenesis imperfecta Father   . Osteogenesis imperfecta Sister   . Stroke Daughter   . Breast cancer Sister   . Colon cancer Neg Hx    Social History   Social History  . Marital status: Married    Spouse  name: N/A  . Number of children: N/A  . Years of education: N/A   Occupational History  . Not on file.   Social History Main Topics  . Smoking status: Never Smoker  . Smokeless tobacco: Never Used  . Alcohol use Yes  . Drug use: No  . Sexual activity: Not on file   Other Topics Concern  . Not on file   Social History Narrative  . No narrative on file   ROS-see HPI   Negative unless "+" Constitutional:    weight loss, night sweats, fevers, chills, + fatigue, lassitude. HEENT:    headaches, difficulty swallowing, tooth/dental problems, + sore throat,      + sneezing, itching, ear ache, + nasal congestion, + post nasal drip, snoring CV:    chest pain, orthopnea, PND, swelling in lower extremities, anasarca,                                                         dizziness, palpitations Resp:   + shortness of breath with exertion or at rest.               + productive cough,   + non-productive cough, coughing up of blood.              change in color of mucus.  wheezing.   Skin:    rash or lesions. GI:  No-   heartburn, indigestion, abdominal pain, nausea, vomiting, diarrhea,                 change in bowel habits, loss of appetite GU: dysuria, change in color of urine, no urgency or frequency.   flank pain. MS:   + joint pain, stiffness, decreased range of motion, back pain. Neuro-     nothing unusual Psych:  change in mood or affect.  depression or anxiety.   memory loss.  OBJ- Physical Exam General- Alert, Oriented, Affect-appropriate, Distress- none acute Skin- rash-none, lesions- none, excoriation- none Lymphadenopathy- none Head- atraumatic            Eyes- Gross vision intact, PERRLA, conjunctivae and secretions clear            Ears- Hearing, canals-normal            Nose- Clear, no-Septal dev, mucus, polyps, erosion, perforation             Throat- Mallampati II , mucosa clear , drainage- none, tonsils- atrophic Neck- flexible , trachea midline, no stridor , thyroid  nl, carotid no bruit Chest - symmetrical excursion , unlabored           Heart/CV- RRR , no murmur , no gallop  , no rub, nl s1 s2                           -  JVD- none , edema- none, stasis changes- none, varices- none           Lung- clear to P&A, wheeze- none, cough- none , dullness-none, rub- none           Chest wall-  Abd-  Br/ Gen/ Rectal- Not done, not indicated Extrem- cyanosis- none, clubbing, none, atrophy- none, strength- nl Neuro- grossly intact to observation

## 2016-02-02 NOTE — Patient Instructions (Signed)
Order- FENO    Dx allergic asthma  Order- Office spirometry     Order lab- Allergy profile, CBC w diff  Order- schedule unattended home sleep test   Dx OSA  Please call as needed

## 2016-02-03 LAB — RESPIRATORY ALLERGY PROFILE REGION II ~~LOC~~
Allergen, C. Herbarum, M2: <0.1 kU/L
Allergen, Cedar tree, t12: <0.1 kU/L
Allergen, Comm Silver Birch, t9: <0.1 kU/L
Allergen, Cottonwood, t14: <0.1 kU/L
Allergen, Mouse Urine Protein, e78: <0.1 kU/L
Allergen, Oak,t7: <0.1 kU/L
Aspergillus fumigatus, m3: <0.1 kU/L
Box Elder IgE: <0.1 kU/L
Cat Dander: <0.1 kU/L
Cockroach: <0.1 kU/L
Dog Dander: 7.8 kU/L — ABNORMAL HIGH
IGE (IMMUNOGLOBULIN E), SERUM: 40 kU/L (ref <115)
Johnson Grass: <0.1 kU/L
Rough Pigweed  IgE: <0.1 kU/L
Timothy Grass: <0.1 kU/L

## 2016-02-12 DIAGNOSIS — G4733 Obstructive sleep apnea (adult) (pediatric): Secondary | ICD-10-CM | POA: Diagnosis not present

## 2016-02-14 DIAGNOSIS — G4733 Obstructive sleep apnea (adult) (pediatric): Secondary | ICD-10-CM | POA: Diagnosis not present

## 2016-02-15 ENCOUNTER — Other Ambulatory Visit: Payer: Self-pay | Admitting: *Deleted

## 2016-02-15 DIAGNOSIS — G4733 Obstructive sleep apnea (adult) (pediatric): Secondary | ICD-10-CM

## 2016-03-03 DIAGNOSIS — Z23 Encounter for immunization: Secondary | ICD-10-CM | POA: Diagnosis not present

## 2016-04-25 ENCOUNTER — Encounter: Payer: Self-pay | Admitting: Internal Medicine

## 2016-04-25 ENCOUNTER — Ambulatory Visit (INDEPENDENT_AMBULATORY_CARE_PROVIDER_SITE_OTHER): Payer: Medicare Other | Admitting: Internal Medicine

## 2016-04-25 ENCOUNTER — Telehealth: Payer: Self-pay | Admitting: Internal Medicine

## 2016-04-25 DIAGNOSIS — J302 Other seasonal allergic rhinitis: Secondary | ICD-10-CM | POA: Diagnosis not present

## 2016-04-25 DIAGNOSIS — G4733 Obstructive sleep apnea (adult) (pediatric): Secondary | ICD-10-CM

## 2016-04-25 DIAGNOSIS — R059 Cough, unspecified: Secondary | ICD-10-CM

## 2016-04-25 DIAGNOSIS — J3089 Other allergic rhinitis: Secondary | ICD-10-CM

## 2016-04-25 DIAGNOSIS — R05 Cough: Secondary | ICD-10-CM | POA: Diagnosis not present

## 2016-04-25 MED ORDER — AMOXICILLIN 500 MG PO TABS: 500.0000 mg | ORAL_TABLET | Freq: Two times a day (BID) | ORAL | 0 refills | Status: DC

## 2016-04-25 NOTE — Assessment & Plan Note (Signed)
Significant elevation of IgE specifically for dog but not for other allergens. Total IgE is not elevated. FENO score of 28 was minimally elevated. Plan-information on HEPA air filters for bedroom, minimize house dust and dog exposure. Sample for trial Dymista nasal spray. Trial of amoxicillin for any possibility of low-grade sinus infection

## 2016-04-25 NOTE — Telephone Encounter (Signed)
Rx called in to pharmacy. Pt aware. Nothing further needed.  

## 2016-04-25 NOTE — Patient Instructions (Signed)
Sample Dymista nasal spray   1-2 puffs each nostril once daily each nostril at bedtime. You may be able to back off the antihistamine pills.  Script sent for for amoxacillin   I do feel you should be treated for your obstructive sleep apnea. The main choices for now would be CPAP vs a fitted oral appliance. CPAP will work better. The oral appliance may be more convenient. Please let me know what you would like to start with.  Please call as needed

## 2016-04-25 NOTE — Progress Notes (Signed)
HPI female never smoker followed for chronic cough, seasonal allergic rhinitis, OSA complicated by glaucoma FENO- 02/02/16- 28- mild elevation Office Spirometry 02/02/2016-normal. FVC 2.76/96%, FEV1 2.31/105%, FEV1/FVC 0.84, FEF 25-75 percent 2.59/131% Allergy Profile 02/02/2016-total IgE 40, elevated specific IgE for dog 7.80 CBC with differential 02/02/2016-WNL, normal eosinophils Unattended Home Sleep Test 02/12/2016-AHI 31.4/hour, desaturation to 72%, body weight 164 pounds  ------------------------------------------------------------------  02/02/2016-65 year old female never smoker referred courtesy of Dr Leatrice Jewels Tisovec; chronic cough and seasonal allergies. Never had labwork for allergies. Has been going on for about 1 year. Pt states she had asthma as a child. Has glaucoma Using Ventolin HFA, Singulair, Claritin She gives a history of seasonal nasal congestion, itching and watering eyes, watery rhinorrhea for which she has tried [facility] and Chlor-Trim[facility]. Chronic cough began in October 2016, usually nonproductive without significant wheeze. Worse around cigarette smoke. She got gradually better, then flared again on a trip to [place] in May. She was treated then with Flovent and seemed to improve. Now coughing again, same pattern, since late August. Given Ventolin inhaler she has used 4 times with some benefit. Singulair daily. Specific allergy triggers not identified. Not aware of other allergy issues-rash, food intolerance. Not aware of significant reflux or heartburn. ENT surgery-tonsils Childhood in a smoking family-mother had COPD. 2 sons said to have at least seasonal allergy. Home environment-hardwood floors and carpet, central air conditioning. Small dog. No mold or water issues recognized. Some dyspnea with exertion, sore throat, nasal congestion and postnasal drip. Second issue-she says her husband suggested she seek evaluation with sleep study for possible sleep apnea because  she snores loudly. She wakes occasionally feeling that she is not breathing but has not been told of witnessed apnea. Occasional nap without oppressive daytime sleepiness. CXR-this spring at Dr. Loren Racer office said to be clear FENO- 02/02/16- 28- mild elevation Office Spirometry 02/02/2016-normal. FVC 2.76/96%, FEV1 2.31/105%, FEV1/FVC 0.84, FEF 25-75 percent 2.59/131%.  04/25/2016-65 year old female never smoker followed for chronic cough, seasonal allergic rhinitis, complicated by glaucoma Unattended Home Sleep Test 02/12/2016-AHI 31.4/hour, desaturation to 72%, body weight 164 pounds. FOLLOWS FOR: Pt states that her cough seems to be returning. Pt taking 2 antihistamins and she is still sneezing and coughing, .Reports some mucus production with cough and sneezing. Review HST and Labs Allergy Profile 02/02/2016-total IgE 40, elevated specific IgE for dog 7.80 CBC with differential 02/02/2016-WNL, normal eosinophils Dry sneeze and cough again recently. Taking 2 antihistamines. Feels much better than in the spring and did quite well through the summer. Mild dull frontal headache 2 weeks with no nasal discharge. Scant clear mucus at times from nose and chest. Cough is minimal. Not wheezing. She thinks sustained improvement through the summer followed prednisone. Has not recognized specific exposure triggers. Small dog sleeps at foot of bed.  ROS-see HPI   Negative unless "+" Constitutional:    weight loss, night sweats, fevers, chills, + fatigue, lassitude. HEENT:    headaches, difficulty swallowing, tooth/dental problems, + sore throat,      + sneezing, itching, ear ache, + nasal congestion, + post nasal drip, snoring CV:    chest pain, orthopnea, PND, swelling in lower extremities, anasarca,                                                         dizziness, palpitations Resp:   +  shortness of breath with exertion or at rest.                productive cough,   + non-productive cough, coughing  up of blood.              change in color of mucus.  wheezing.   Skin:    rash or lesions. GI:  No-   heartburn, indigestion, abdominal pain, nausea, vomiting, diarrhea,                 change in bowel habits, loss of appetite GU: dysuria, change in color of urine, no urgency or frequency.   flank pain. MS:   + joint pain, stiffness, decreased range of motion, back pain. Neuro-     nothing unusual Psych:  change in mood or affect.  depression or anxiety.   memory loss.  OBJ- Physical Exam General- Alert, Oriented, Affect-appropriate, Distress- none acute Skin- rash-none, lesions- none, excoriation- none Lymphadenopathy- none Head- atraumatic            Eyes- Gross vision intact, PERRLA, conjunctivae and secretions clear            Ears- Hearing, canals-normal            Nose- + turbinate edema, no-Septal dev, mucus, polyps, erosion, perforation             Throat- Mallampati III , mucosa clear , drainage- none, tonsils- atrophic Neck- flexible , trachea midline, no stridor , thyroid nl, carotid no bruit Chest - symmetrical excursion , unlabored           Heart/CV- RRR , no murmur , no gallop  , no rub, nl s1 s2                           - JVD- none , edema- none, stasis changes- none, varices- none           Lung- clear to P&A, wheeze- none, cough- none , dullness-none, rub- none           Chest wall-  Abd-  Br/ Gen/ Rectal- Not done, not indicated Extrem- cyanosis- none, clubbing, none, atrophy- none, strength- nl Neuro- grossly intact to observation

## 2016-04-25 NOTE — Assessment & Plan Note (Signed)
We discussed her sleep study. Her son tells her she snores loudly but she would otherwise be not aware. Husband uses an oral appliance and she likes that idea because the portability during travel. I think CPAP would give her better control given her severe score range. She will consider options and let us know what she wants to do.

## 2016-04-25 NOTE — Assessment & Plan Note (Signed)
Cough would resolve through the summer is been worse in the distal last few weeks without an obvious acute infection. Rhinitis symptoms do suggest this may be a source. We will see how she responds to treatment for rhinitis. Consider mecholyl study.

## 2016-04-25 NOTE — Telephone Encounter (Signed)
Sorry- we were sending amoxacillin 500 mg, # 14, 1 twice daily

## 2016-04-25 NOTE — Telephone Encounter (Signed)
Called and spoke to [place] with [employer] [facility]. Amoxacillin was not sent to pharmacy. Pt was seen today 04/25/16 by CY.   Dr. Annamaria Boots please advise on strength, frequency, and duration. Thanks.    OV instructions: Sample Dymista nasal spray   1-2 puffs each nostril once daily each nostril at bedtime. You may be able to back off the antihistamine pills.   Script sent for for amoxacillin    I do feel you should be treated for your obstructive sleep apnea. The main choices for now would be CPAP vs a fitted oral appliance. CPAP will work better. The oral appliance may be more convenient. Please let me know what you would like to start with.   Please call as needed

## 2016-05-19 ENCOUNTER — Telehealth: Payer: Self-pay | Admitting: Internal Medicine

## 2016-05-19 DIAGNOSIS — G4733 Obstructive sleep apnea (adult) (pediatric): Secondary | ICD-10-CM

## 2016-05-19 NOTE — Telephone Encounter (Signed)
Spoke with pt, wishes to proceed with dental device as discussed in 12/19 office visit.    CY ok to order?  Thanks!

## 2016-05-22 MED ORDER — AZELASTINE-FLUTICASONE 137-50 MCG/ACT NA SUSP: NASAL | 11 refills | Status: DC

## 2016-05-22 NOTE — Telephone Encounter (Signed)
Called and spoke with pt and she is aware of refill that has been sent to the pharmacy per CY.

## 2016-05-22 NOTE — Telephone Encounter (Signed)
Please order referral to Dr Elta [place] Ron Parker, Orthodontist-      Consider oral appliance for treatment of OSA

## 2016-05-22 NOTE — Telephone Encounter (Signed)
Ref has been placed. Pt aware and voiced her understanding.  Pt states she has completed her sample of dymista, and would like a Rx sent to her preferred pharmacy, if Dr. Annamaria Boots doesn't think this will affect her Glaucoma.  CY please advise. thanks.

## 2016-05-22 NOTE — Telephone Encounter (Signed)
Ok to Rx Dymista nasal spray   # 1,  Ref x 12      1-2 puffs each nostril once daily as needed

## 2016-05-23 ENCOUNTER — Telehealth: Payer: Self-pay | Admitting: Internal Medicine

## 2016-05-23 MED ORDER — AZELASTINE HCL 0.1 % NA SOLN: NASAL | 12 refills | Status: DC

## 2016-05-23 MED ORDER — FLUTICASONE PROPIONATE 50 MCG/ACT NA SUSP
NASAL | 2 refills | Status: DC
Start: 2016-05-23 — End: 2016-08-25

## 2016-05-23 NOTE — Telephone Encounter (Signed)
Called and spoke with pharmacy and they stated that the pts insurance would not cover the dymista.  Changed to the fluticasone and asteline.  These have been sent to the pharmacy and I called and spoke with the pt and she is aware of change.

## 2016-05-31 ENCOUNTER — Telehealth: Payer: Self-pay

## 2016-05-31 DIAGNOSIS — M9901 Segmental and somatic dysfunction of cervical region: Secondary | ICD-10-CM | POA: Diagnosis not present

## 2016-05-31 DIAGNOSIS — M5136 Other intervertebral disc degeneration, lumbar region: Secondary | ICD-10-CM | POA: Diagnosis not present

## 2016-05-31 DIAGNOSIS — M5384 Other specified dorsopathies, thoracic region: Secondary | ICD-10-CM | POA: Diagnosis not present

## 2016-05-31 DIAGNOSIS — M9905 Segmental and somatic dysfunction of pelvic region: Secondary | ICD-10-CM | POA: Diagnosis not present

## 2016-05-31 DIAGNOSIS — M50322 Other cervical disc degeneration at C5-C6 level: Secondary | ICD-10-CM | POA: Diagnosis not present

## 2016-05-31 DIAGNOSIS — M9902 Segmental and somatic dysfunction of thoracic region: Secondary | ICD-10-CM | POA: Diagnosis not present

## 2016-05-31 DIAGNOSIS — Q72811 Congenital shortening of right lower limb: Secondary | ICD-10-CM | POA: Diagnosis not present

## 2016-05-31 DIAGNOSIS — M9903 Segmental and somatic dysfunction of lumbar region: Secondary | ICD-10-CM | POA: Diagnosis not present

## 2016-05-31 DIAGNOSIS — M9904 Segmental and somatic dysfunction of sacral region: Secondary | ICD-10-CM | POA: Diagnosis not present

## 2016-06-07 DIAGNOSIS — Q72811 Congenital shortening of right lower limb: Secondary | ICD-10-CM | POA: Diagnosis not present

## 2016-06-07 DIAGNOSIS — M5136 Other intervertebral disc degeneration, lumbar region: Secondary | ICD-10-CM | POA: Diagnosis not present

## 2016-06-07 DIAGNOSIS — M9902 Segmental and somatic dysfunction of thoracic region: Secondary | ICD-10-CM | POA: Diagnosis not present

## 2016-06-07 DIAGNOSIS — M9903 Segmental and somatic dysfunction of lumbar region: Secondary | ICD-10-CM | POA: Diagnosis not present

## 2016-06-07 DIAGNOSIS — M9904 Segmental and somatic dysfunction of sacral region: Secondary | ICD-10-CM | POA: Diagnosis not present

## 2016-06-07 DIAGNOSIS — M5384 Other specified dorsopathies, thoracic region: Secondary | ICD-10-CM | POA: Diagnosis not present

## 2016-06-07 DIAGNOSIS — M50322 Other cervical disc degeneration at C5-C6 level: Secondary | ICD-10-CM | POA: Diagnosis not present

## 2016-06-07 DIAGNOSIS — M9905 Segmental and somatic dysfunction of pelvic region: Secondary | ICD-10-CM | POA: Diagnosis not present

## 2016-06-07 DIAGNOSIS — M9901 Segmental and somatic dysfunction of cervical region: Secondary | ICD-10-CM | POA: Diagnosis not present

## 2016-06-09 DIAGNOSIS — M50322 Other cervical disc degeneration at C5-C6 level: Secondary | ICD-10-CM | POA: Diagnosis not present

## 2016-06-09 DIAGNOSIS — M9904 Segmental and somatic dysfunction of sacral region: Secondary | ICD-10-CM | POA: Diagnosis not present

## 2016-06-09 DIAGNOSIS — M9901 Segmental and somatic dysfunction of cervical region: Secondary | ICD-10-CM | POA: Diagnosis not present

## 2016-06-09 DIAGNOSIS — M9905 Segmental and somatic dysfunction of pelvic region: Secondary | ICD-10-CM | POA: Diagnosis not present

## 2016-06-09 DIAGNOSIS — M9902 Segmental and somatic dysfunction of thoracic region: Secondary | ICD-10-CM | POA: Diagnosis not present

## 2016-06-09 DIAGNOSIS — Q72811 Congenital shortening of right lower limb: Secondary | ICD-10-CM | POA: Diagnosis not present

## 2016-06-09 DIAGNOSIS — M9903 Segmental and somatic dysfunction of lumbar region: Secondary | ICD-10-CM | POA: Diagnosis not present

## 2016-06-09 DIAGNOSIS — M5384 Other specified dorsopathies, thoracic region: Secondary | ICD-10-CM | POA: Diagnosis not present

## 2016-06-09 DIAGNOSIS — M5136 Other intervertebral disc degeneration, lumbar region: Secondary | ICD-10-CM | POA: Diagnosis not present

## 2016-06-13 DIAGNOSIS — M9905 Segmental and somatic dysfunction of pelvic region: Secondary | ICD-10-CM | POA: Diagnosis not present

## 2016-06-13 DIAGNOSIS — M9902 Segmental and somatic dysfunction of thoracic region: Secondary | ICD-10-CM | POA: Diagnosis not present

## 2016-06-13 DIAGNOSIS — M50322 Other cervical disc degeneration at C5-C6 level: Secondary | ICD-10-CM | POA: Diagnosis not present

## 2016-06-13 DIAGNOSIS — M5384 Other specified dorsopathies, thoracic region: Secondary | ICD-10-CM | POA: Diagnosis not present

## 2016-06-13 DIAGNOSIS — M5136 Other intervertebral disc degeneration, lumbar region: Secondary | ICD-10-CM | POA: Diagnosis not present

## 2016-06-13 DIAGNOSIS — M9904 Segmental and somatic dysfunction of sacral region: Secondary | ICD-10-CM | POA: Diagnosis not present

## 2016-06-13 DIAGNOSIS — M9901 Segmental and somatic dysfunction of cervical region: Secondary | ICD-10-CM | POA: Diagnosis not present

## 2016-06-13 DIAGNOSIS — Q72811 Congenital shortening of right lower limb: Secondary | ICD-10-CM | POA: Diagnosis not present

## 2016-06-13 DIAGNOSIS — M9903 Segmental and somatic dysfunction of lumbar region: Secondary | ICD-10-CM | POA: Diagnosis not present

## 2016-06-14 DIAGNOSIS — M9904 Segmental and somatic dysfunction of sacral region: Secondary | ICD-10-CM | POA: Diagnosis not present

## 2016-06-14 DIAGNOSIS — M5384 Other specified dorsopathies, thoracic region: Secondary | ICD-10-CM | POA: Diagnosis not present

## 2016-06-14 DIAGNOSIS — M5136 Other intervertebral disc degeneration, lumbar region: Secondary | ICD-10-CM | POA: Diagnosis not present

## 2016-06-14 DIAGNOSIS — M9905 Segmental and somatic dysfunction of pelvic region: Secondary | ICD-10-CM | POA: Diagnosis not present

## 2016-06-14 DIAGNOSIS — M50322 Other cervical disc degeneration at C5-C6 level: Secondary | ICD-10-CM | POA: Diagnosis not present

## 2016-06-14 DIAGNOSIS — M9903 Segmental and somatic dysfunction of lumbar region: Secondary | ICD-10-CM | POA: Diagnosis not present

## 2016-06-14 DIAGNOSIS — Q72811 Congenital shortening of right lower limb: Secondary | ICD-10-CM | POA: Diagnosis not present

## 2016-06-14 DIAGNOSIS — M9902 Segmental and somatic dysfunction of thoracic region: Secondary | ICD-10-CM | POA: Diagnosis not present

## 2016-06-14 DIAGNOSIS — M9901 Segmental and somatic dysfunction of cervical region: Secondary | ICD-10-CM | POA: Diagnosis not present

## 2016-06-16 DIAGNOSIS — M9903 Segmental and somatic dysfunction of lumbar region: Secondary | ICD-10-CM | POA: Diagnosis not present

## 2016-06-16 DIAGNOSIS — M9904 Segmental and somatic dysfunction of sacral region: Secondary | ICD-10-CM | POA: Diagnosis not present

## 2016-06-16 DIAGNOSIS — M9905 Segmental and somatic dysfunction of pelvic region: Secondary | ICD-10-CM | POA: Diagnosis not present

## 2016-06-16 DIAGNOSIS — M9901 Segmental and somatic dysfunction of cervical region: Secondary | ICD-10-CM | POA: Diagnosis not present

## 2016-06-16 DIAGNOSIS — M5136 Other intervertebral disc degeneration, lumbar region: Secondary | ICD-10-CM | POA: Diagnosis not present

## 2016-06-16 DIAGNOSIS — M9902 Segmental and somatic dysfunction of thoracic region: Secondary | ICD-10-CM | POA: Diagnosis not present

## 2016-06-16 DIAGNOSIS — M5384 Other specified dorsopathies, thoracic region: Secondary | ICD-10-CM | POA: Diagnosis not present

## 2016-06-16 DIAGNOSIS — Q72811 Congenital shortening of right lower limb: Secondary | ICD-10-CM | POA: Diagnosis not present

## 2016-06-16 DIAGNOSIS — M50322 Other cervical disc degeneration at C5-C6 level: Secondary | ICD-10-CM | POA: Diagnosis not present

## 2016-06-19 DIAGNOSIS — M50322 Other cervical disc degeneration at C5-C6 level: Secondary | ICD-10-CM | POA: Diagnosis not present

## 2016-06-19 DIAGNOSIS — M9904 Segmental and somatic dysfunction of sacral region: Secondary | ICD-10-CM | POA: Diagnosis not present

## 2016-06-19 DIAGNOSIS — M5384 Other specified dorsopathies, thoracic region: Secondary | ICD-10-CM | POA: Diagnosis not present

## 2016-06-19 DIAGNOSIS — M9903 Segmental and somatic dysfunction of lumbar region: Secondary | ICD-10-CM | POA: Diagnosis not present

## 2016-06-19 DIAGNOSIS — M9905 Segmental and somatic dysfunction of pelvic region: Secondary | ICD-10-CM | POA: Diagnosis not present

## 2016-06-19 DIAGNOSIS — M9901 Segmental and somatic dysfunction of cervical region: Secondary | ICD-10-CM | POA: Diagnosis not present

## 2016-06-19 DIAGNOSIS — M9902 Segmental and somatic dysfunction of thoracic region: Secondary | ICD-10-CM | POA: Diagnosis not present

## 2016-06-19 DIAGNOSIS — M5136 Other intervertebral disc degeneration, lumbar region: Secondary | ICD-10-CM | POA: Diagnosis not present

## 2016-06-19 DIAGNOSIS — Q72811 Congenital shortening of right lower limb: Secondary | ICD-10-CM | POA: Diagnosis not present

## 2016-06-21 DIAGNOSIS — M5384 Other specified dorsopathies, thoracic region: Secondary | ICD-10-CM | POA: Diagnosis not present

## 2016-06-21 DIAGNOSIS — Q72811 Congenital shortening of right lower limb: Secondary | ICD-10-CM | POA: Diagnosis not present

## 2016-06-21 DIAGNOSIS — M9905 Segmental and somatic dysfunction of pelvic region: Secondary | ICD-10-CM | POA: Diagnosis not present

## 2016-06-21 DIAGNOSIS — M5136 Other intervertebral disc degeneration, lumbar region: Secondary | ICD-10-CM | POA: Diagnosis not present

## 2016-06-21 DIAGNOSIS — M9904 Segmental and somatic dysfunction of sacral region: Secondary | ICD-10-CM | POA: Diagnosis not present

## 2016-06-21 DIAGNOSIS — M9902 Segmental and somatic dysfunction of thoracic region: Secondary | ICD-10-CM | POA: Diagnosis not present

## 2016-06-21 DIAGNOSIS — M9901 Segmental and somatic dysfunction of cervical region: Secondary | ICD-10-CM | POA: Diagnosis not present

## 2016-06-21 DIAGNOSIS — M9903 Segmental and somatic dysfunction of lumbar region: Secondary | ICD-10-CM | POA: Diagnosis not present

## 2016-06-21 DIAGNOSIS — M50322 Other cervical disc degeneration at C5-C6 level: Secondary | ICD-10-CM | POA: Diagnosis not present

## 2016-06-27 DIAGNOSIS — M9904 Segmental and somatic dysfunction of sacral region: Secondary | ICD-10-CM | POA: Diagnosis not present

## 2016-06-27 DIAGNOSIS — M50322 Other cervical disc degeneration at C5-C6 level: Secondary | ICD-10-CM | POA: Diagnosis not present

## 2016-06-27 DIAGNOSIS — M5136 Other intervertebral disc degeneration, lumbar region: Secondary | ICD-10-CM | POA: Diagnosis not present

## 2016-06-27 DIAGNOSIS — M9903 Segmental and somatic dysfunction of lumbar region: Secondary | ICD-10-CM | POA: Diagnosis not present

## 2016-06-27 DIAGNOSIS — M9902 Segmental and somatic dysfunction of thoracic region: Secondary | ICD-10-CM | POA: Diagnosis not present

## 2016-06-27 DIAGNOSIS — M5384 Other specified dorsopathies, thoracic region: Secondary | ICD-10-CM | POA: Diagnosis not present

## 2016-06-27 DIAGNOSIS — Q72811 Congenital shortening of right lower limb: Secondary | ICD-10-CM | POA: Diagnosis not present

## 2016-06-27 DIAGNOSIS — M9905 Segmental and somatic dysfunction of pelvic region: Secondary | ICD-10-CM | POA: Diagnosis not present

## 2016-06-27 DIAGNOSIS — M9901 Segmental and somatic dysfunction of cervical region: Secondary | ICD-10-CM | POA: Diagnosis not present

## 2016-06-29 DIAGNOSIS — M50322 Other cervical disc degeneration at C5-C6 level: Secondary | ICD-10-CM | POA: Diagnosis not present

## 2016-06-29 DIAGNOSIS — M9902 Segmental and somatic dysfunction of thoracic region: Secondary | ICD-10-CM | POA: Diagnosis not present

## 2016-06-29 DIAGNOSIS — M5136 Other intervertebral disc degeneration, lumbar region: Secondary | ICD-10-CM | POA: Diagnosis not present

## 2016-06-29 DIAGNOSIS — M5384 Other specified dorsopathies, thoracic region: Secondary | ICD-10-CM | POA: Diagnosis not present

## 2016-06-29 DIAGNOSIS — M9905 Segmental and somatic dysfunction of pelvic region: Secondary | ICD-10-CM | POA: Diagnosis not present

## 2016-06-29 DIAGNOSIS — M9901 Segmental and somatic dysfunction of cervical region: Secondary | ICD-10-CM | POA: Diagnosis not present

## 2016-06-29 DIAGNOSIS — M9903 Segmental and somatic dysfunction of lumbar region: Secondary | ICD-10-CM | POA: Diagnosis not present

## 2016-06-29 DIAGNOSIS — Q72811 Congenital shortening of right lower limb: Secondary | ICD-10-CM | POA: Diagnosis not present

## 2016-06-29 DIAGNOSIS — M9904 Segmental and somatic dysfunction of sacral region: Secondary | ICD-10-CM | POA: Diagnosis not present

## 2016-06-30 DIAGNOSIS — M5384 Other specified dorsopathies, thoracic region: Secondary | ICD-10-CM | POA: Diagnosis not present

## 2016-06-30 DIAGNOSIS — M50322 Other cervical disc degeneration at C5-C6 level: Secondary | ICD-10-CM | POA: Diagnosis not present

## 2016-06-30 DIAGNOSIS — Q72811 Congenital shortening of right lower limb: Secondary | ICD-10-CM | POA: Diagnosis not present

## 2016-06-30 DIAGNOSIS — M9901 Segmental and somatic dysfunction of cervical region: Secondary | ICD-10-CM | POA: Diagnosis not present

## 2016-06-30 DIAGNOSIS — M9905 Segmental and somatic dysfunction of pelvic region: Secondary | ICD-10-CM | POA: Diagnosis not present

## 2016-06-30 DIAGNOSIS — M9903 Segmental and somatic dysfunction of lumbar region: Secondary | ICD-10-CM | POA: Diagnosis not present

## 2016-06-30 DIAGNOSIS — M9902 Segmental and somatic dysfunction of thoracic region: Secondary | ICD-10-CM | POA: Diagnosis not present

## 2016-06-30 DIAGNOSIS — M9904 Segmental and somatic dysfunction of sacral region: Secondary | ICD-10-CM | POA: Diagnosis not present

## 2016-06-30 DIAGNOSIS — M5136 Other intervertebral disc degeneration, lumbar region: Secondary | ICD-10-CM | POA: Diagnosis not present

## 2016-07-04 DIAGNOSIS — M9904 Segmental and somatic dysfunction of sacral region: Secondary | ICD-10-CM | POA: Diagnosis not present

## 2016-07-04 DIAGNOSIS — M5136 Other intervertebral disc degeneration, lumbar region: Secondary | ICD-10-CM | POA: Diagnosis not present

## 2016-07-04 DIAGNOSIS — M9905 Segmental and somatic dysfunction of pelvic region: Secondary | ICD-10-CM | POA: Diagnosis not present

## 2016-07-04 DIAGNOSIS — M50322 Other cervical disc degeneration at C5-C6 level: Secondary | ICD-10-CM | POA: Diagnosis not present

## 2016-07-04 DIAGNOSIS — Q72811 Congenital shortening of right lower limb: Secondary | ICD-10-CM | POA: Diagnosis not present

## 2016-07-04 DIAGNOSIS — M9901 Segmental and somatic dysfunction of cervical region: Secondary | ICD-10-CM | POA: Diagnosis not present

## 2016-07-04 DIAGNOSIS — M9903 Segmental and somatic dysfunction of lumbar region: Secondary | ICD-10-CM | POA: Diagnosis not present

## 2016-07-04 DIAGNOSIS — M9902 Segmental and somatic dysfunction of thoracic region: Secondary | ICD-10-CM | POA: Diagnosis not present

## 2016-07-04 DIAGNOSIS — M5384 Other specified dorsopathies, thoracic region: Secondary | ICD-10-CM | POA: Diagnosis not present

## 2016-07-04 NOTE — Telephone Encounter (Signed)
close

## 2016-07-06 DIAGNOSIS — M50322 Other cervical disc degeneration at C5-C6 level: Secondary | ICD-10-CM | POA: Diagnosis not present

## 2016-07-06 DIAGNOSIS — M9903 Segmental and somatic dysfunction of lumbar region: Secondary | ICD-10-CM | POA: Diagnosis not present

## 2016-07-06 DIAGNOSIS — M9902 Segmental and somatic dysfunction of thoracic region: Secondary | ICD-10-CM | POA: Diagnosis not present

## 2016-07-06 DIAGNOSIS — M5136 Other intervertebral disc degeneration, lumbar region: Secondary | ICD-10-CM | POA: Diagnosis not present

## 2016-07-06 DIAGNOSIS — M5384 Other specified dorsopathies, thoracic region: Secondary | ICD-10-CM | POA: Diagnosis not present

## 2016-07-06 DIAGNOSIS — Q72811 Congenital shortening of right lower limb: Secondary | ICD-10-CM | POA: Diagnosis not present

## 2016-07-06 DIAGNOSIS — M9901 Segmental and somatic dysfunction of cervical region: Secondary | ICD-10-CM | POA: Diagnosis not present

## 2016-07-06 DIAGNOSIS — M9904 Segmental and somatic dysfunction of sacral region: Secondary | ICD-10-CM | POA: Diagnosis not present

## 2016-07-06 DIAGNOSIS — M9905 Segmental and somatic dysfunction of pelvic region: Secondary | ICD-10-CM | POA: Diagnosis not present

## 2016-07-11 DIAGNOSIS — M5384 Other specified dorsopathies, thoracic region: Secondary | ICD-10-CM | POA: Diagnosis not present

## 2016-07-11 DIAGNOSIS — M9903 Segmental and somatic dysfunction of lumbar region: Secondary | ICD-10-CM | POA: Diagnosis not present

## 2016-07-11 DIAGNOSIS — M9905 Segmental and somatic dysfunction of pelvic region: Secondary | ICD-10-CM | POA: Diagnosis not present

## 2016-07-11 DIAGNOSIS — M9904 Segmental and somatic dysfunction of sacral region: Secondary | ICD-10-CM | POA: Diagnosis not present

## 2016-07-11 DIAGNOSIS — M9901 Segmental and somatic dysfunction of cervical region: Secondary | ICD-10-CM | POA: Diagnosis not present

## 2016-07-11 DIAGNOSIS — Q72811 Congenital shortening of right lower limb: Secondary | ICD-10-CM | POA: Diagnosis not present

## 2016-07-11 DIAGNOSIS — M50322 Other cervical disc degeneration at C5-C6 level: Secondary | ICD-10-CM | POA: Diagnosis not present

## 2016-07-11 DIAGNOSIS — M9902 Segmental and somatic dysfunction of thoracic region: Secondary | ICD-10-CM | POA: Diagnosis not present

## 2016-07-11 DIAGNOSIS — M5136 Other intervertebral disc degeneration, lumbar region: Secondary | ICD-10-CM | POA: Diagnosis not present

## 2016-07-13 DIAGNOSIS — M9904 Segmental and somatic dysfunction of sacral region: Secondary | ICD-10-CM | POA: Diagnosis not present

## 2016-07-13 DIAGNOSIS — M5136 Other intervertebral disc degeneration, lumbar region: Secondary | ICD-10-CM | POA: Diagnosis not present

## 2016-07-13 DIAGNOSIS — Q72811 Congenital shortening of right lower limb: Secondary | ICD-10-CM | POA: Diagnosis not present

## 2016-07-13 DIAGNOSIS — M50322 Other cervical disc degeneration at C5-C6 level: Secondary | ICD-10-CM | POA: Diagnosis not present

## 2016-07-13 DIAGNOSIS — M9905 Segmental and somatic dysfunction of pelvic region: Secondary | ICD-10-CM | POA: Diagnosis not present

## 2016-07-13 DIAGNOSIS — M5384 Other specified dorsopathies, thoracic region: Secondary | ICD-10-CM | POA: Diagnosis not present

## 2016-07-13 DIAGNOSIS — M9902 Segmental and somatic dysfunction of thoracic region: Secondary | ICD-10-CM | POA: Diagnosis not present

## 2016-07-13 DIAGNOSIS — M9903 Segmental and somatic dysfunction of lumbar region: Secondary | ICD-10-CM | POA: Diagnosis not present

## 2016-07-13 DIAGNOSIS — M9901 Segmental and somatic dysfunction of cervical region: Secondary | ICD-10-CM | POA: Diagnosis not present

## 2016-07-18 DIAGNOSIS — M9901 Segmental and somatic dysfunction of cervical region: Secondary | ICD-10-CM | POA: Diagnosis not present

## 2016-07-18 DIAGNOSIS — M50322 Other cervical disc degeneration at C5-C6 level: Secondary | ICD-10-CM | POA: Diagnosis not present

## 2016-07-18 DIAGNOSIS — M9904 Segmental and somatic dysfunction of sacral region: Secondary | ICD-10-CM | POA: Diagnosis not present

## 2016-07-18 DIAGNOSIS — M9903 Segmental and somatic dysfunction of lumbar region: Secondary | ICD-10-CM | POA: Diagnosis not present

## 2016-07-18 DIAGNOSIS — M5384 Other specified dorsopathies, thoracic region: Secondary | ICD-10-CM | POA: Diagnosis not present

## 2016-07-18 DIAGNOSIS — Q72811 Congenital shortening of right lower limb: Secondary | ICD-10-CM | POA: Diagnosis not present

## 2016-07-18 DIAGNOSIS — M9905 Segmental and somatic dysfunction of pelvic region: Secondary | ICD-10-CM | POA: Diagnosis not present

## 2016-07-18 DIAGNOSIS — M9902 Segmental and somatic dysfunction of thoracic region: Secondary | ICD-10-CM | POA: Diagnosis not present

## 2016-07-18 DIAGNOSIS — M5136 Other intervertebral disc degeneration, lumbar region: Secondary | ICD-10-CM | POA: Diagnosis not present

## 2016-07-25 DIAGNOSIS — M9901 Segmental and somatic dysfunction of cervical region: Secondary | ICD-10-CM | POA: Diagnosis not present

## 2016-07-25 DIAGNOSIS — M5384 Other specified dorsopathies, thoracic region: Secondary | ICD-10-CM | POA: Diagnosis not present

## 2016-07-25 DIAGNOSIS — M9902 Segmental and somatic dysfunction of thoracic region: Secondary | ICD-10-CM | POA: Diagnosis not present

## 2016-07-25 DIAGNOSIS — M9904 Segmental and somatic dysfunction of sacral region: Secondary | ICD-10-CM | POA: Diagnosis not present

## 2016-07-25 DIAGNOSIS — M5136 Other intervertebral disc degeneration, lumbar region: Secondary | ICD-10-CM | POA: Diagnosis not present

## 2016-07-25 DIAGNOSIS — M9905 Segmental and somatic dysfunction of pelvic region: Secondary | ICD-10-CM | POA: Diagnosis not present

## 2016-07-25 DIAGNOSIS — M50322 Other cervical disc degeneration at C5-C6 level: Secondary | ICD-10-CM | POA: Diagnosis not present

## 2016-07-25 DIAGNOSIS — M9903 Segmental and somatic dysfunction of lumbar region: Secondary | ICD-10-CM | POA: Diagnosis not present

## 2016-07-25 DIAGNOSIS — Q72811 Congenital shortening of right lower limb: Secondary | ICD-10-CM | POA: Diagnosis not present

## 2016-08-01 DIAGNOSIS — H401131 Primary open-angle glaucoma, bilateral, mild stage: Secondary | ICD-10-CM | POA: Diagnosis not present

## 2016-08-01 DIAGNOSIS — M5384 Other specified dorsopathies, thoracic region: Secondary | ICD-10-CM | POA: Diagnosis not present

## 2016-08-01 DIAGNOSIS — M9904 Segmental and somatic dysfunction of sacral region: Secondary | ICD-10-CM | POA: Diagnosis not present

## 2016-08-01 DIAGNOSIS — M9902 Segmental and somatic dysfunction of thoracic region: Secondary | ICD-10-CM | POA: Diagnosis not present

## 2016-08-01 DIAGNOSIS — M50322 Other cervical disc degeneration at C5-C6 level: Secondary | ICD-10-CM | POA: Diagnosis not present

## 2016-08-01 DIAGNOSIS — M5136 Other intervertebral disc degeneration, lumbar region: Secondary | ICD-10-CM | POA: Diagnosis not present

## 2016-08-01 DIAGNOSIS — Q72811 Congenital shortening of right lower limb: Secondary | ICD-10-CM | POA: Diagnosis not present

## 2016-08-01 DIAGNOSIS — M9901 Segmental and somatic dysfunction of cervical region: Secondary | ICD-10-CM | POA: Diagnosis not present

## 2016-08-01 DIAGNOSIS — M9905 Segmental and somatic dysfunction of pelvic region: Secondary | ICD-10-CM | POA: Diagnosis not present

## 2016-08-01 DIAGNOSIS — M9903 Segmental and somatic dysfunction of lumbar region: Secondary | ICD-10-CM | POA: Diagnosis not present

## 2016-08-21 ENCOUNTER — Encounter: Payer: Self-pay | Admitting: Internal Medicine

## 2016-08-23 ENCOUNTER — Other Ambulatory Visit: Payer: Self-pay | Admitting: Internal Medicine

## 2016-08-23 DIAGNOSIS — Z1231 Encounter for screening mammogram for malignant neoplasm of breast: Secondary | ICD-10-CM

## 2016-08-24 ENCOUNTER — Ambulatory Visit: Payer: Medicare Other | Admitting: Internal Medicine

## 2016-08-25 ENCOUNTER — Encounter: Payer: Self-pay | Admitting: Internal Medicine

## 2016-08-25 ENCOUNTER — Ambulatory Visit (INDEPENDENT_AMBULATORY_CARE_PROVIDER_SITE_OTHER): Payer: Medicare Other | Admitting: Internal Medicine

## 2016-08-25 VITALS — BP 110/70 | HR 85 | Ht 61.0 in | Wt 166.6 lb

## 2016-08-25 DIAGNOSIS — J3089 Other allergic rhinitis: Secondary | ICD-10-CM

## 2016-08-25 DIAGNOSIS — R05 Cough: Secondary | ICD-10-CM | POA: Diagnosis not present

## 2016-08-25 DIAGNOSIS — J302 Other seasonal allergic rhinitis: Secondary | ICD-10-CM | POA: Diagnosis not present

## 2016-08-25 DIAGNOSIS — G4733 Obstructive sleep apnea (adult) (pediatric): Secondary | ICD-10-CM | POA: Diagnosis not present

## 2016-08-25 DIAGNOSIS — R053 Chronic cough: Secondary | ICD-10-CM

## 2016-08-25 DIAGNOSIS — R059 Cough, unspecified: Secondary | ICD-10-CM

## 2016-08-25 MED ORDER — BENZONATATE 200 MG PO CAPS: 200.0000 mg | ORAL_CAPSULE | Freq: Three times a day (TID) | ORAL | 4 refills | Status: DC | PRN

## 2016-08-25 NOTE — Assessment & Plan Note (Signed)
Intractable cough by description although not noted at this exam even with deep breath. Plan-methacholine inhalation challenge test, then consider exploring additional inhalers, limited by her concerns re glaucoma

## 2016-08-25 NOTE — Progress Notes (Signed)
HPI female never smoker followed for chronic cough, seasonal allergic rhinitis, OSA complicated by glaucoma FENO- 02/02/16- 28- mild elevation Office Spirometry 02/02/2016-normal. FVC 2.76/96%, FEV1 2.31/105%, FEV1/FVC 0.84, FEF 25-75 percent 2.59/131% Allergy Profile 02/02/2016-total IgE 40, elevated specific IgE for dog 7.80 CBC with differential 02/02/2016-WNL, normal eosinophils Unattended Home Sleep Test 02/12/2016-AHI 31.4/hour, desaturation to 72%, body weight 164 pounds  ------------------------------------------------------------------  02/02/2016-66 year old female never smoker referred courtesy of Dr Leatrice Jewels Tisovec; chronic cough and seasonal allergies. Never had labwork for allergies. Has been going on for about 1 year. Pt states she had asthma as a child. Has glaucoma Using Ventolin HFA, Singulair, Claritin She gives a history of seasonal nasal congestion, itching and watering eyes, watery rhinorrhea for which she has tried [facility] and [facility]lor[facility]. Chronic cough began in October 2016, usually nonproductive without significant wheeze. Worse around cigarette smoke. She got gradually better, then flared again on a trip to [place] in May. She was treated then with Flovent and seemed to improve. Now coughing again, same pattern, since late August. Given Ventolin inhaler she has used 4 times with some benefit. Singulair daily. Specific allergy triggers not identified. Not aware of other allergy issues-rash, food intolerance. Not aware of significant reflux or heartburn. ENT surgery-tonsils Childhood in a smoking family-mother had COPD. 2 sons said to have at least seasonal allergy. Home environment-hardwood floors and carpet, central air conditioning. Small dog. No mold or water issues recognized. Some dyspnea with exertion, sore throat, nasal congestion and postnasal drip. Second issue-she says her husband suggested she seek evaluation with sleep study for possible sleep apnea because  she snores loudly. She wakes occasionally feeling that she is not breathing but has not been told of witnessed apnea. Occasional nap without oppressive daytime sleepiness. CXR-this spring at Dr. Loren Racer office said to be clear FENO- 02/02/16- 28- mild elevation Office Spirometry 02/02/2016-normal. FVC 2.76/96%, FEV1 2.31/105%, FEV1/FVC 0.84, FEF 25-75 percent 2.59/131%.  04/25/2016-66 year old female never smoker followed for chronic cough, seasonal allergic rhinitis, complicated by glaucoma Unattended Home Sleep Test 02/12/2016-AHI 31.4/hour, desaturation to 72%, body weight 164 pounds. FOLLOWS FOR: Pt states that her cough seems to be returning. Pt taking 2 antihistamins and she is still sneezing and coughing, .Reports some mucus production with cough and sneezing. Review HST and Labs Allergy Profile 02/02/2016-total IgE 40, elevated specific IgE for dog 7.80 CBC with differential 02/02/2016-WNL, normal eosinophils Dry sneeze and cough again recently. Taking 2 antihistamines. Feels much better than in the spring and did quite well through the summer. Mild dull frontal headache 2 weeks with no nasal discharge. Scant clear mucus at times from nose and chest. Cough is minimal. Not wheezing. She thinks sustained improvement through the summer followed prednisone. Has not recognized specific exposure triggers. Small dog sleeps at foot of bed.  08/25/16- 66 year old female never smoker followed for chronic cough, seasonal allergic rhinitis, OSA, complicated by glaucoma FOLLOWS FOR: Pt started wearing oral appliance through Dr Ron Parker as of 07-10-16. Pt is doing well.  We had given Dymista and amoxicillin for cough, considering option of methacholine study. For her OSA she chose referral for oral appliance. She is very reluctant to take even nasal steroids because of her glaucoma. Notices some wheeze occasionally lying down. Cough persistent, worse in the last week after being outdoors all day. Rarely notices  very small green plug no fever or blood. Shortness would not cover Dymista so she is using azelastine nasal spray. When she started that, she dropped off of loratadine and Singulair. Some recent  increase itching eyes. Added room air cleaner.  ROS-see HPI   Negative unless "+" Constitutional:    weight loss, night sweats, fevers, chills, + fatigue, lassitude. HEENT:    headaches, difficulty swallowing, tooth/dental problems, + sore throat,      + sneezing, itching, ear ache, + nasal congestion, + post nasal drip, snoring CV:    chest pain, orthopnea, PND, swelling in lower extremities, anasarca,                                                         dizziness, palpitations Resp:   + shortness of breath with exertion or at rest.                productive cough,   + non-productive cough, coughing up of blood.              change in color of mucus.  wheezing.   Skin:    rash or lesions. GI:  No-   heartburn, indigestion, abdominal pain, nausea, vomiting, diarrhea,                 change in bowel habits, loss of appetite GU: dysuria, change in color of urine, no urgency or frequency.   flank pain. MS:   + joint pain, stiffness, decreased range of motion, back pain. Neuro-     nothing unusual Psych:  change in mood or affect.  depression or anxiety.   memory loss.  OBJ- Physical Exam General- Alert, Oriented, Affect-appropriate, Distress- none acute Skin- rash-none, lesions- none, excoriation- none Lymphadenopathy- none Head- atraumatic            Eyes- Gross vision intact, PERRLA, conjunctivae and secretions clear            Ears- Hearing, canals-normal            Nose- + turbinate edema, no-Septal dev, mucus, polyps, erosion, perforation             Throat- Mallampati III , mucosa clear , drainage- none, tonsils- atrophic Neck- flexible , trachea midline, no stridor , thyroid nl, carotid no bruit Chest - symmetrical excursion , unlabored           Heart/CV- RRR , no murmur , no gallop  ,  no rub, nl s1 s2                           - JVD- none , edema- none, stasis changes- none, varices- none           Lung- clear to P&A, wheeze- none, cough- none while here today , dullness-none, rub- none           Chest wall-  Abd-  Br/ Gen/ Rectal- Not done, not indicated Extrem- cyanosis- none, clubbing, none, atrophy- none, strength- nl Neuro- grossly intact to observation

## 2016-08-25 NOTE — Assessment & Plan Note (Signed)
Increased rhinitis/conjunctivitis symptoms recently consistent with spring tree pollens. She likes azelastine nasal spray. She can add Flonase if she and her eye doctor decide her glaucoma will tolerate any steroid. She can also resume use of Singulair and loratadine as discussed Consider referral to allergist

## 2016-08-25 NOTE — Assessment & Plan Note (Signed)
Working with oral appliance/Dr. Ron Parker

## 2016-08-25 NOTE — Patient Instructions (Addendum)
Order- schedule Methacholine Inhalation Challenge    Dx chronic cough  Script sent for benzonatate perles to use for cough if needed  Ok to continue Azelastine nasal spray, Delsym cough syrup, and montelukast/ Singulair as you find helpful  Please call as needed

## 2016-08-30 DIAGNOSIS — R7301 Impaired fasting glucose: Secondary | ICD-10-CM | POA: Diagnosis not present

## 2016-08-30 DIAGNOSIS — E559 Vitamin D deficiency, unspecified: Secondary | ICD-10-CM | POA: Diagnosis not present

## 2016-08-30 DIAGNOSIS — I10 Essential (primary) hypertension: Secondary | ICD-10-CM | POA: Diagnosis not present

## 2016-08-30 DIAGNOSIS — E7800 Pure hypercholesterolemia, unspecified: Secondary | ICD-10-CM | POA: Diagnosis not present

## 2016-08-30 DIAGNOSIS — N390 Urinary tract infection, site not specified: Secondary | ICD-10-CM | POA: Diagnosis not present

## 2016-08-30 DIAGNOSIS — R8299 Other abnormal findings in urine: Secondary | ICD-10-CM | POA: Diagnosis not present

## 2016-08-30 DIAGNOSIS — Z780 Asymptomatic menopausal state: Secondary | ICD-10-CM | POA: Diagnosis not present

## 2016-08-30 LAB — HM DEXA SCAN

## 2016-09-05 ENCOUNTER — Ambulatory Visit (HOSPITAL_COMMUNITY)
Admission: RE | Admit: 2016-09-05 | Discharge: 2016-09-05 | Disposition: A | Discharge status: Home / Self Care | Payer: Medicare Other | Source: Ambulatory Visit | Attending: Internal Medicine | Admitting: Internal Medicine

## 2016-09-05 DIAGNOSIS — J449 Chronic obstructive pulmonary disease, unspecified: Secondary | ICD-10-CM | POA: Insufficient documentation

## 2016-09-05 DIAGNOSIS — R05 Cough: Secondary | ICD-10-CM | POA: Diagnosis present

## 2016-09-05 DIAGNOSIS — R053 Chronic cough: Secondary | ICD-10-CM

## 2016-09-05 LAB — PULMONARY FUNCTION TEST
FEF 25-75 Post: 2.73 L/sec
FEF 25-75 Pre: 2.13 L/sec
FEF2575-%Change-Post: 28 %
FEF2575-%Pred-Post: 147 %
FEF2575-%Pred-Pre: 115 %
FEV1-%Change-Post: 5 %
FEV1-%Pred-Post: 105 %
FEV1-%Pred-Pre: 100 %
FEV1-PRE: 2.08 L
FEV1-Post: 2.19 L
FEV1FVC-%Change-Post: 4 %
FEV1FVC-%Pred-Pre: 106 %
FEV6-%Change-Post: 1 %
FEV6-%PRED-POST: 98 %
FEV6-%PRED-PRE: 97 %
FEV6-POST: 2.57 L
FEV6-Pre: 2.54 L
FEV6FVC-%Change-Post: 1 %
FEV6FVC-%PRED-POST: 104 %
FEV6FVC-%Pred-Pre: 103 %
FVC-%Change-Post: 0 %
FVC-%PRED-PRE: 94 %
FVC-%Pred-Post: 94 %
FVC-POST: 2.58 L
FVC-Pre: 2.57 L
POST FEV6/FVC RATIO: 100 %
PRE FEV1/FVC RATIO: 81 %
Post FEV1/FVC ratio: 85 %
Pre FEV6/FVC Ratio: 99 %

## 2016-09-05 MED ORDER — METHACHOLINE 0.0625 MG/ML NEB SOLN
2.0000 mL | Freq: Once | RESPIRATORY_TRACT | Status: AC
  Administered 2016-09-05: 0.125 mg via RESPIRATORY_TRACT

## 2016-09-05 MED ORDER — METHACHOLINE 4 MG/ML NEB SOLN
2.0000 mL | Freq: Once | RESPIRATORY_TRACT | Status: AC
  Administered 2016-09-05: 8 mg via RESPIRATORY_TRACT

## 2016-09-05 MED ORDER — METHACHOLINE 16 MG/ML NEB SOLN: 2.0000 mL | Freq: Once | RESPIRATORY_TRACT | Status: DC

## 2016-09-05 MED ORDER — SODIUM CHLORIDE 0.9 % IN NEBU
3.0000 mL | INHALATION_SOLUTION | Freq: Once | RESPIRATORY_TRACT | Status: AC
  Administered 2016-09-05: 3 mL via RESPIRATORY_TRACT

## 2016-09-05 MED ORDER — METHACHOLINE 1 MG/ML NEB SOLN
2.0000 mL | Freq: Once | RESPIRATORY_TRACT | Status: AC
  Administered 2016-09-05: 2 mg via RESPIRATORY_TRACT

## 2016-09-05 MED ORDER — ALBUTEROL SULFATE (2.5 MG/3ML) 0.083% IN NEBU
2.5000 mg | INHALATION_SOLUTION | Freq: Once | RESPIRATORY_TRACT | Status: AC
  Administered 2016-09-05: 2.5 mg via RESPIRATORY_TRACT

## 2016-09-05 MED ORDER — METHACHOLINE 0.25 MG/ML NEB SOLN
2.0000 mL | Freq: Once | RESPIRATORY_TRACT | Status: AC
  Administered 2016-09-05: 0.5 mg via RESPIRATORY_TRACT

## 2016-09-06 DIAGNOSIS — Z6830 Body mass index (BMI) 30.0-30.9, adult: Secondary | ICD-10-CM | POA: Diagnosis not present

## 2016-09-06 DIAGNOSIS — G4733 Obstructive sleep apnea (adult) (pediatric): Secondary | ICD-10-CM | POA: Diagnosis not present

## 2016-09-06 DIAGNOSIS — H4089 Other specified glaucoma: Secondary | ICD-10-CM | POA: Diagnosis not present

## 2016-09-06 DIAGNOSIS — Z0000 Encounter for general adult medical examination without abnormal findings: Secondary | ICD-10-CM | POA: Diagnosis not present

## 2016-09-06 DIAGNOSIS — E7800 Pure hypercholesterolemia, unspecified: Secondary | ICD-10-CM | POA: Diagnosis not present

## 2016-09-06 DIAGNOSIS — Z1389 Encounter for screening for other disorder: Secondary | ICD-10-CM | POA: Diagnosis not present

## 2016-09-06 DIAGNOSIS — R7301 Impaired fasting glucose: Secondary | ICD-10-CM | POA: Diagnosis not present

## 2016-09-06 DIAGNOSIS — I10 Essential (primary) hypertension: Secondary | ICD-10-CM | POA: Diagnosis not present

## 2016-09-06 DIAGNOSIS — J302 Other seasonal allergic rhinitis: Secondary | ICD-10-CM | POA: Diagnosis not present

## 2016-09-06 DIAGNOSIS — M859 Disorder of bone density and structure, unspecified: Secondary | ICD-10-CM | POA: Diagnosis not present

## 2016-09-06 DIAGNOSIS — E668 Other obesity: Secondary | ICD-10-CM | POA: Diagnosis not present

## 2016-09-06 DIAGNOSIS — E559 Vitamin D deficiency, unspecified: Secondary | ICD-10-CM | POA: Diagnosis not present

## 2016-09-11 DIAGNOSIS — Z1212 Encounter for screening for malignant neoplasm of rectum: Secondary | ICD-10-CM | POA: Diagnosis not present

## 2016-09-11 LAB — IFOBT (OCCULT BLOOD): IFOBT: NEGATIVE

## 2016-09-28 DIAGNOSIS — M26623 Arthralgia of bilateral temporomandibular joint: Secondary | ICD-10-CM | POA: Insufficient documentation

## 2016-09-28 DIAGNOSIS — J309 Allergic rhinitis, unspecified: Secondary | ICD-10-CM | POA: Insufficient documentation

## 2016-09-28 DIAGNOSIS — G4733 Obstructive sleep apnea (adult) (pediatric): Secondary | ICD-10-CM | POA: Diagnosis not present

## 2016-09-28 DIAGNOSIS — H9202 Otalgia, left ear: Secondary | ICD-10-CM | POA: Insufficient documentation

## 2016-10-16 ENCOUNTER — Ambulatory Visit
Admission: RE | Admit: 2016-10-16 | Discharge: 2016-10-16 | Disposition: A | Discharge status: Home / Self Care | Payer: Medicare Other | Source: Ambulatory Visit | Attending: Internal Medicine | Admitting: Internal Medicine

## 2016-10-16 DIAGNOSIS — Z1231 Encounter for screening mammogram for malignant neoplasm of breast: Secondary | ICD-10-CM

## 2016-10-17 ENCOUNTER — Other Ambulatory Visit: Payer: Self-pay | Admitting: Internal Medicine

## 2016-10-17 DIAGNOSIS — R928 Other abnormal and inconclusive findings on diagnostic imaging of breast: Secondary | ICD-10-CM

## 2016-10-19 ENCOUNTER — Other Ambulatory Visit: Payer: Self-pay | Admitting: Internal Medicine

## 2016-10-19 ENCOUNTER — Ambulatory Visit
Admission: RE | Admit: 2016-10-19 | Discharge: 2016-10-19 | Disposition: A | Discharge status: Home / Self Care | Payer: Medicare Other | Source: Ambulatory Visit | Attending: Internal Medicine | Admitting: Internal Medicine

## 2016-10-19 DIAGNOSIS — R921 Mammographic calcification found on diagnostic imaging of breast: Secondary | ICD-10-CM

## 2016-10-19 DIAGNOSIS — R928 Other abnormal and inconclusive findings on diagnostic imaging of breast: Secondary | ICD-10-CM

## 2016-10-19 DIAGNOSIS — R922 Inconclusive mammogram: Secondary | ICD-10-CM | POA: Diagnosis not present

## 2016-10-20 ENCOUNTER — Ambulatory Visit
Admission: RE | Admit: 2016-10-20 | Discharge: 2016-10-20 | Disposition: A | Discharge status: Home / Self Care | Payer: Medicare Other | Source: Ambulatory Visit | Attending: Internal Medicine | Admitting: Internal Medicine

## 2016-10-20 ENCOUNTER — Other Ambulatory Visit: Payer: Self-pay | Admitting: Internal Medicine

## 2016-10-20 DIAGNOSIS — R921 Mammographic calcification found on diagnostic imaging of breast: Secondary | ICD-10-CM | POA: Diagnosis not present

## 2016-10-20 DIAGNOSIS — N6012 Diffuse cystic mastopathy of left breast: Secondary | ICD-10-CM | POA: Diagnosis not present

## 2016-12-26 ENCOUNTER — Encounter: Payer: Self-pay | Admitting: Internal Medicine

## 2016-12-26 ENCOUNTER — Ambulatory Visit (INDEPENDENT_AMBULATORY_CARE_PROVIDER_SITE_OTHER): Payer: Medicare Other | Admitting: Internal Medicine

## 2016-12-26 VITALS — BP 123/78 | HR 74 | Ht 61.0 in | Wt 162.0 lb

## 2016-12-26 DIAGNOSIS — J45991 Cough variant asthma: Secondary | ICD-10-CM

## 2016-12-26 DIAGNOSIS — G4733 Obstructive sleep apnea (adult) (pediatric): Secondary | ICD-10-CM | POA: Diagnosis not present

## 2016-12-26 DIAGNOSIS — J3089 Other allergic rhinitis: Secondary | ICD-10-CM | POA: Diagnosis not present

## 2016-12-26 DIAGNOSIS — J302 Other seasonal allergic rhinitis: Secondary | ICD-10-CM

## 2016-12-26 NOTE — Assessment & Plan Note (Signed)
Appropriate to manage this as cough equivalent asthma. She resumed Singulair and denies any recent flare. Has not been needing Ventolin rescue inhaler.

## 2016-12-26 NOTE — Assessment & Plan Note (Signed)
She is happy with the control she has been getting using Astelin nasal spray

## 2016-12-26 NOTE — Patient Instructions (Signed)
Order- unattended home sleep test ( wearing your oral appliance)    Dx OSA  Ok to use your breathing medicines as you have been.  Please call as needed

## 2016-12-26 NOTE — Assessment & Plan Note (Signed)
She is pleased with the way she feels using fitted oral appliance, Breathe Right strip and Astelin nasal spray. She says Dr. Ron Parker would like better control of AHI. Plan-schedule home sleep test wearing oral appliance.

## 2016-12-26 NOTE — Progress Notes (Signed)
HPI female never smoker followed for chronic cough, seasonal allergic rhinitis, OSA complicated by glaucoma FENO- 02/02/16- 28- mild elevation Office Spirometry 02/02/2016-normal. FVC 2.76/96%, FEV1 2.31/105%, FEV1/FVC 0.84, FEF 25-75 percent 2.59/131% Allergy Profile 02/02/2016-total IgE 40, elevated specific IgE for dog 7.80 CBC with differential 02/02/2016-WNL, normal eosinophils Unattended Home Sleep Test 02/12/2016-AHI 31.4/hour, desaturation to 72%, body weight 164 pounds  Methacholine Test 09/05/16- positive for hyperreactive airways. FEV1 declined by more than 20% -----------------------------------------------------------------  08/25/16- 66 year old female never smoker followed for chronic cough, seasonal allergic rhinitis, OSA, complicated by glaucoma FOLLOWS FOR: Pt started wearing oral appliance through Dr Ron Parker as of 07-10-16. Pt is doing well.  We had given Dymista and amoxicillin for cough, considering option of methacholine study. For her OSA she chose referral for oral appliance. She is very reluctant to take even nasal steroids because of her glaucoma. Notices some wheeze occasionally lying down. Cough persistent, worse in the last week after being outdoors all day. Rarely notices very small green plug no fever or blood. Shortness would not cover Dymista so she is using azelastine nasal spray. When she started that, she dropped off of loratadine and Singulair. Some recent increase itching eyes. Added room air cleaner.  12/26/16- 66 year old female never smoker followed for chronic cough, seasonal allergic rhinitis, OSA, complicated by glaucoma Oral appliance-Dr. Ron Parker for OSA FOLLOWS FOR: Pt wears Oral appliance with breathe right strip and Astelin NS;no issues at this time. Pt had  Methacholine Test 09/05/16- positive for hyperreactive airways. FEV1 declined by more than 20%. Feels her airway control is much better using a combination of azelastine nasal spray and Singulair tablet. No  flare in a long time. She reports Dr. Ron Parker has not been satisfied with her AHI wearing her oral appliance, by his test and has asked about doing a home sleep test through our system. She feels she sleeps very well, no longer napping. Her husband "doesn't notice" if she snores.   ROS-see HPI   + = positive Constitutional:    weight loss, night sweats, fevers, chills,  fatigue, lassitude. HEENT:    headaches, difficulty swallowing, tooth/dental problems, + sore throat,       sneezing, itching, ear ache,  nasal congestion,  post nasal drip, snoring CV:    chest pain, orthopnea, PND, swelling in lower extremities, anasarca,                                                         dizziness, palpitations Resp:   + shortness of breath with exertion or at rest.                productive cough,    non-productive cough, coughing up of blood.              change in color of mucus.  wheezing.   Skin:    rash or lesions. GI:  No-   heartburn, indigestion, abdominal pain, nausea, vomiting, diarrhea,                 change in bowel habits, loss of appetite GU: dysuria, change in color of urine, no urgency or frequency.   flank pain. MS:   + joint pain, stiffness, decreased range of motion, back pain. Neuro-     nothing unusual Psych:  change in mood or affect.  depression or anxiety.   memory loss.  OBJ- Physical Exam General- Alert, Oriented, Affect-appropriate, Distress- none acute Skin- rash-none, lesions- none, excoriation- none Lymphadenopathy- none Head- atraumatic            Eyes- Gross vision intact, PERRLA, conjunctivae and secretions clear            Ears- Hearing, canals-normal            Nose-  turbinate edema-none, no-Septal dev, mucus, polyps, erosion, perforation             Throat- Mallampati III , mucosa clear , drainage- none, tonsils- atrophic Neck- flexible , trachea midline, no stridor , thyroid nl, carotid no bruit Chest - symmetrical excursion , unlabored           Heart/CV-  RRR , no murmur , no gallop  , no rub, nl s1 s2                           - JVD- none , edema- none, stasis changes- none, varices- none           Lung- clear to P&A, wheeze- none, cough- none while here today , dullness-none, rub- none           Chest wall-  Abd-  Br/ Gen/ Rectal- Not done, not indicated Extrem- cyanosis- none, clubbing, none, atrophy- none, strength- nl Neuro- grossly intact to observation

## 2017-01-03 DIAGNOSIS — G4733 Obstructive sleep apnea (adult) (pediatric): Secondary | ICD-10-CM | POA: Diagnosis not present

## 2017-01-05 ENCOUNTER — Other Ambulatory Visit: Payer: Self-pay | Admitting: *Deleted

## 2017-01-05 DIAGNOSIS — G4733 Obstructive sleep apnea (adult) (pediatric): Secondary | ICD-10-CM

## 2017-01-17 ENCOUNTER — Telehealth: Payer: Self-pay | Admitting: Internal Medicine

## 2017-01-17 DIAGNOSIS — G4733 Obstructive sleep apnea (adult) (pediatric): Secondary | ICD-10-CM

## 2017-01-17 NOTE — Telephone Encounter (Signed)
Her original sleep study showed sleep apnea averaging 31 apneas/ hour. The current home sleep test, done while she was wearing her oral appliance, showed apneas averaging 26/ hour. This was from a mixture of obstructive apneas and central apneas. We don't usually need to treat the central apneas, where her brain just doesn't feel the need to to take a breath. Usually the obstructive apneas respond best to CPAP, or sometimes an oral appliance like hers. If she doesn't want to change, then this may be the best we can do.  My suggestion would be that she consider trying CPAP. If she agrees, we would order new DME, new CPAP, mask of choice, auto 5-20, humidifier, supplies, AirView  Dx OSA.  Another option would be to refer her to [facility], Dr Gertie Gowda group, to discuss newer treatment they can offer.

## 2017-01-17 NOTE — Telephone Encounter (Signed)
I looked in Katie's result box, I did not see a result on patient's sleep study results.   CY, please advise. Thanks!

## 2017-01-22 NOTE — Telephone Encounter (Signed)
Spoke with pt and advised of sleep results and treatment options per Dr Annamaria Boots.  PT verbalized understanding and would like to see Dr Redmond Baseman group before trying the CPAP.  ENT referral placed.  Nothing further needed

## 2017-01-30 DIAGNOSIS — H401131 Primary open-angle glaucoma, bilateral, mild stage: Secondary | ICD-10-CM | POA: Diagnosis not present

## 2017-02-01 DIAGNOSIS — G4733 Obstructive sleep apnea (adult) (pediatric): Secondary | ICD-10-CM | POA: Diagnosis not present

## 2017-02-01 DIAGNOSIS — J343 Hypertrophy of nasal turbinates: Secondary | ICD-10-CM | POA: Insufficient documentation

## 2017-02-06 DIAGNOSIS — Z23 Encounter for immunization: Secondary | ICD-10-CM | POA: Diagnosis not present

## 2017-03-06 DIAGNOSIS — E7800 Pure hypercholesterolemia, unspecified: Secondary | ICD-10-CM | POA: Diagnosis not present

## 2017-03-06 DIAGNOSIS — E668 Other obesity: Secondary | ICD-10-CM | POA: Diagnosis not present

## 2017-03-06 DIAGNOSIS — R7301 Impaired fasting glucose: Secondary | ICD-10-CM | POA: Diagnosis not present

## 2017-03-06 DIAGNOSIS — H4089 Other specified glaucoma: Secondary | ICD-10-CM | POA: Diagnosis not present

## 2017-03-06 DIAGNOSIS — J302 Other seasonal allergic rhinitis: Secondary | ICD-10-CM | POA: Diagnosis not present

## 2017-03-06 DIAGNOSIS — Z6830 Body mass index (BMI) 30.0-30.9, adult: Secondary | ICD-10-CM | POA: Diagnosis not present

## 2017-03-06 DIAGNOSIS — G4733 Obstructive sleep apnea (adult) (pediatric): Secondary | ICD-10-CM | POA: Diagnosis not present

## 2017-03-06 DIAGNOSIS — I10 Essential (primary) hypertension: Secondary | ICD-10-CM | POA: Diagnosis not present

## 2017-03-06 DIAGNOSIS — M859 Disorder of bone density and structure, unspecified: Secondary | ICD-10-CM | POA: Diagnosis not present

## 2017-03-06 DIAGNOSIS — K219 Gastro-esophageal reflux disease without esophagitis: Secondary | ICD-10-CM | POA: Diagnosis not present

## 2017-03-06 DIAGNOSIS — E559 Vitamin D deficiency, unspecified: Secondary | ICD-10-CM | POA: Diagnosis not present

## 2017-04-10 ENCOUNTER — Encounter: Payer: Self-pay | Admitting: Gastroenterology

## 2017-04-26 ENCOUNTER — Encounter: Payer: Self-pay | Admitting: Internal Medicine

## 2017-04-26 ENCOUNTER — Ambulatory Visit (INDEPENDENT_AMBULATORY_CARE_PROVIDER_SITE_OTHER): Payer: Medicare Other | Admitting: Internal Medicine

## 2017-04-26 DIAGNOSIS — G4733 Obstructive sleep apnea (adult) (pediatric): Secondary | ICD-10-CM | POA: Diagnosis not present

## 2017-04-26 DIAGNOSIS — J45991 Cough variant asthma: Secondary | ICD-10-CM | POA: Diagnosis not present

## 2017-04-26 NOTE — Patient Instructions (Signed)
We can get an overnight sleep study if you and Dr Ron Parker feel it is needed. Foir now we agreed to let you husband know what he notices.  P;lease call if we can help

## 2017-04-26 NOTE — Progress Notes (Signed)
HPI female never smoker followed for chronic cough/ Asthma, seasonal allergic rhinitis, OSA complicated by glaucoma FENO- 02/02/16- 28- mild elevation Office Spirometry 02/02/2016-normal. FVC 2.76/96%, FEV1 2.31/105%, FEV1/FVC 0.84, FEF 25-75 percent 2.59/131% Allergy Profile 02/02/2016-total IgE 40, elevated specific IgE for dog 7.80 CBC with differential 02/02/2016-WNL, normal eosinophils Unattended Home Sleep Test 02/12/2016-AHI 31.4/hour, desaturation to 72%, body weight 164 pounds  Methacholine Test 09/05/16- positive for hyperreactive airways. FEV1 declined by more than 20% ----------------------------------------------------------------  12/26/16- 66 year old female never smoker followed for chronic cough, seasonal allergic rhinitis, OSA, complicated by glaucoma Oral appliance-Dr. Ron Parker for OSA FOLLOWS FOR: Pt wears Oral appliance with breathe right strip and Astelin NS;no issues at this time. Pt had  Methacholine Test 09/05/16- positive for hyperreactive airways. FEV1 declined by more than 20%. Feels her airway control is much better using a combination of azelastine nasal spray and Singulair tablet. No flare in a long time. She reports Dr. Ron Parker has not been satisfied with her AHI wearing her oral appliance, by his test and has asked about doing a home sleep test through our system. She feels she sleeps very well, no longer napping. Her husband "doesn't notice" if she snores.   04/26/17- 66 year old female never smoker followed for chronic cough/ Asthma, seasonal allergic rhinitis, OSA, complicated by glaucoma Oral appliance-Dr. Ron Parker for OSA ---OSA; Pt wears Oral appliance every night.  Ventolin HFA, Singulair, benzonatate Perles No cough or wheeze at all-"much better". Continues using oral appliance. Snored on a trip sleeping on a flat bed. Dr Erik Obey suggested she use Afrin once daily at bedtime if needed to help with snoring. Lingering concern that home sleep test indicates residual apneas.  She had failed to tolerate CPAP and is not interested in surgical options.  ROS-see HPI   + = positive Constitutional:    weight loss, night sweats, fevers, chills,  fatigue, lassitude. HEENT:    headaches, difficulty swallowing, tooth/dental problems, + sore throat,       sneezing, itching, ear ache,  nasal congestion,  post nasal drip, snoring CV:    chest pain, orthopnea, PND, swelling in lower extremities, anasarca,                                                         dizziness, palpitations Resp:   + shortness of breath with exertion or at rest.                productive cough,    non-productive cough, coughing up of blood.              change in color of mucus.  wheezing.   Skin:    rash or lesions. GI:  No-   heartburn, indigestion, abdominal pain, nausea, vomiting, diarrhea,                 change in bowel habits, loss of appetite GU: dysuria, change in color of urine, no urgency or frequency.   flank pain. MS:   + joint pain, stiffness, decreased range of motion, back pain. Neuro-     nothing unusual Psych:  change in mood or affect.  depression or anxiety.   memory loss.  OBJ- Physical Exam General- Alert, Oriented, Affect-appropriate, Distress- none acute Skin- rash-none, lesions- none, excoriation- none Lymphadenopathy- none Head- atraumatic  Eyes- Gross vision intact, PERRLA, conjunctivae and secretions clear            Ears- Hearing, canals-normal            Nose-  turbinate edema-none, no-Septal dev, mucus, polyps, erosion, perforation             Throat- Mallampati III , mucosa clear , drainage- none, tonsils- atrophic Neck- flexible , trachea midline, no stridor , thyroid nl, carotid no bruit Chest - symmetrical excursion , unlabored           Heart/CV- RRR , no murmur , no gallop  , no rub, nl s1 s2                           - JVD- none , edema- none, stasis changes- none, varices- none           Lung- clear to P&A, wheeze- none, cough- none while here  today , dullness-none, rub- none           Chest wall-  Abd-  Br/ Gen/ Rectal- Not done, not indicated Extrem- cyanosis- none, clubbing, none, atrophy- none, strength- nl Neuro- grossly intact to observation

## 2017-05-08 DIAGNOSIS — L659 Nonscarring hair loss, unspecified: Secondary | ICD-10-CM | POA: Insufficient documentation

## 2017-05-09 NOTE — Assessment & Plan Note (Signed)
She feels well and feels she is sleeping well. Not told that she snores with oral appliance. If concerns remain within certainly update sleep study to assess her oral appliance, but as long as she is clinically doing as well as reported, and she feels improved by her oral appliance, I suggested we not worry about residual apneas.

## 2017-05-09 NOTE — Assessment & Plan Note (Signed)
Mild intermittent well-controlled and currently clear. Okay to resume inhalers if needed.

## 2017-07-31 DIAGNOSIS — H401131 Primary open-angle glaucoma, bilateral, mild stage: Secondary | ICD-10-CM | POA: Diagnosis not present

## 2017-09-04 ENCOUNTER — Other Ambulatory Visit: Payer: Self-pay | Admitting: Internal Medicine

## 2017-09-04 DIAGNOSIS — I10 Essential (primary) hypertension: Secondary | ICD-10-CM | POA: Diagnosis not present

## 2017-09-04 DIAGNOSIS — E7800 Pure hypercholesterolemia, unspecified: Secondary | ICD-10-CM | POA: Diagnosis not present

## 2017-09-04 DIAGNOSIS — E559 Vitamin D deficiency, unspecified: Secondary | ICD-10-CM | POA: Diagnosis not present

## 2017-09-04 DIAGNOSIS — Z1231 Encounter for screening mammogram for malignant neoplasm of breast: Secondary | ICD-10-CM

## 2017-09-04 DIAGNOSIS — R82998 Other abnormal findings in urine: Secondary | ICD-10-CM | POA: Diagnosis not present

## 2017-09-04 DIAGNOSIS — R7301 Impaired fasting glucose: Secondary | ICD-10-CM | POA: Diagnosis not present

## 2017-09-04 LAB — LIPID PANEL
CHOLESTEROL: 164 (ref 0–200)
HDL: 41 (ref 35–70)
LDL Cholesterol: 107
Triglycerides: 81 (ref 40–160)

## 2017-09-04 LAB — HEMOGLOBIN A1C: Hemoglobin A1C: 5.6

## 2017-09-04 LAB — CBC AND DIFFERENTIAL
HCT: 43 (ref 36–46)
Hemoglobin: 13.8 (ref 12.0–16.0)
PLATELETS: 202 (ref 150–399)
WBC: 5.5

## 2017-09-04 LAB — BASIC METABOLIC PANEL
BUN: 23 — AB (ref 4–21)
CREATININE: 0.8 (ref 0.5–1.1)
Glucose: 107
POTASSIUM: 4.2 (ref 3.4–5.3)
Sodium: 142 (ref 137–147)

## 2017-09-04 LAB — HEPATIC FUNCTION PANEL
ALT: 36 — AB (ref 7–35)
AST: 25 (ref 13–35)
Alkaline Phosphatase: 70 (ref 25–125)
Bilirubin, Total: 0.6

## 2017-09-04 LAB — MICROALBUMIN, URINE: Microalb, Ur: 5

## 2017-09-04 LAB — VITAMIN D 25 HYDROXY (VIT D DEFICIENCY, FRACTURES): Vit D, 25-Hydroxy: 74

## 2017-09-11 DIAGNOSIS — E559 Vitamin D deficiency, unspecified: Secondary | ICD-10-CM | POA: Diagnosis not present

## 2017-09-11 DIAGNOSIS — N390 Urinary tract infection, site not specified: Secondary | ICD-10-CM | POA: Diagnosis not present

## 2017-09-11 DIAGNOSIS — Z0000 Encounter for general adult medical examination without abnormal findings: Secondary | ICD-10-CM | POA: Diagnosis not present

## 2017-09-11 DIAGNOSIS — G4733 Obstructive sleep apnea (adult) (pediatric): Secondary | ICD-10-CM | POA: Diagnosis not present

## 2017-09-11 DIAGNOSIS — J302 Other seasonal allergic rhinitis: Secondary | ICD-10-CM | POA: Diagnosis not present

## 2017-09-11 DIAGNOSIS — M859 Disorder of bone density and structure, unspecified: Secondary | ICD-10-CM | POA: Diagnosis not present

## 2017-09-11 DIAGNOSIS — K219 Gastro-esophageal reflux disease without esophagitis: Secondary | ICD-10-CM | POA: Diagnosis not present

## 2017-09-11 DIAGNOSIS — Z6829 Body mass index (BMI) 29.0-29.9, adult: Secondary | ICD-10-CM | POA: Diagnosis not present

## 2017-09-11 DIAGNOSIS — R7301 Impaired fasting glucose: Secondary | ICD-10-CM | POA: Diagnosis not present

## 2017-09-11 DIAGNOSIS — R928 Other abnormal and inconclusive findings on diagnostic imaging of breast: Secondary | ICD-10-CM | POA: Diagnosis not present

## 2017-09-11 DIAGNOSIS — Z1389 Encounter for screening for other disorder: Secondary | ICD-10-CM | POA: Diagnosis not present

## 2017-09-11 DIAGNOSIS — E668 Other obesity: Secondary | ICD-10-CM | POA: Diagnosis not present

## 2017-09-14 ENCOUNTER — Other Ambulatory Visit: Payer: Self-pay | Admitting: Internal Medicine

## 2017-09-19 DIAGNOSIS — Z1212 Encounter for screening for malignant neoplasm of rectum: Secondary | ICD-10-CM | POA: Diagnosis not present

## 2017-09-19 LAB — IFOBT (OCCULT BLOOD): IFOBT: NEGATIVE

## 2017-10-18 ENCOUNTER — Ambulatory Visit
Admission: RE | Admit: 2017-10-18 | Discharge: 2017-10-18 | Disposition: A | Discharge status: Home / Self Care | Payer: Medicare Other | Source: Ambulatory Visit | Attending: Internal Medicine | Admitting: Internal Medicine

## 2017-10-18 DIAGNOSIS — Z1231 Encounter for screening mammogram for malignant neoplasm of breast: Secondary | ICD-10-CM | POA: Diagnosis not present

## 2017-11-06 DIAGNOSIS — E7800 Pure hypercholesterolemia, unspecified: Secondary | ICD-10-CM | POA: Diagnosis not present

## 2017-11-06 LAB — LIPID PANEL
Cholesterol: 270 — AB (ref 0–200)
HDL: 51 (ref 35–70)
LDL Cholesterol: 205
TRIGLYCERIDES: 69 (ref 40–160)

## 2017-11-28 DIAGNOSIS — Z6828 Body mass index (BMI) 28.0-28.9, adult: Secondary | ICD-10-CM | POA: Diagnosis not present

## 2017-11-28 DIAGNOSIS — E7800 Pure hypercholesterolemia, unspecified: Secondary | ICD-10-CM | POA: Diagnosis not present

## 2018-01-11 ENCOUNTER — Encounter: Payer: Self-pay | Admitting: Family Medicine

## 2018-01-11 ENCOUNTER — Ambulatory Visit (INDEPENDENT_AMBULATORY_CARE_PROVIDER_SITE_OTHER): Payer: Medicare Other | Admitting: Family Medicine

## 2018-01-11 VITALS — BP 154/78 | HR 79 | Temp 98.5°F | Ht 61.0 in | Wt 147.2 lb

## 2018-01-11 DIAGNOSIS — E785 Hyperlipidemia, unspecified: Secondary | ICD-10-CM

## 2018-01-11 DIAGNOSIS — I10 Essential (primary) hypertension: Secondary | ICD-10-CM

## 2018-01-11 DIAGNOSIS — J45991 Cough variant asthma: Secondary | ICD-10-CM | POA: Diagnosis not present

## 2018-01-11 DIAGNOSIS — Z23 Encounter for immunization: Secondary | ICD-10-CM | POA: Diagnosis not present

## 2018-01-11 DIAGNOSIS — Z86010 Personal history of colonic polyps: Secondary | ICD-10-CM

## 2018-01-11 DIAGNOSIS — G43109 Migraine with aura, not intractable, without status migrainosus: Secondary | ICD-10-CM | POA: Insufficient documentation

## 2018-01-11 DIAGNOSIS — G4733 Obstructive sleep apnea (adult) (pediatric): Secondary | ICD-10-CM | POA: Diagnosis not present

## 2018-01-11 DIAGNOSIS — Z87442 Personal history of urinary calculi: Secondary | ICD-10-CM | POA: Diagnosis not present

## 2018-01-11 DIAGNOSIS — L608 Other nail disorders: Secondary | ICD-10-CM

## 2018-01-11 DIAGNOSIS — L679 Hair color and hair shaft abnormality, unspecified: Secondary | ICD-10-CM | POA: Diagnosis not present

## 2018-01-11 DIAGNOSIS — E118 Type 2 diabetes mellitus with unspecified complications: Secondary | ICD-10-CM | POA: Insufficient documentation

## 2018-01-11 DIAGNOSIS — Z789 Other specified health status: Secondary | ICD-10-CM

## 2018-01-11 DIAGNOSIS — E8881 Metabolic syndrome: Secondary | ICD-10-CM

## 2018-01-11 LAB — TSH: TSH: 0.99 u[IU]/mL (ref 0.35–4.50)

## 2018-01-11 MED ORDER — AZELASTINE HCL 0.1 % NA SOLN: NASAL | 3 refills | Status: DC

## 2018-01-11 MED ORDER — HYDROCHLOROTHIAZIDE 25 MG PO TABS: ORAL_TABLET | ORAL | 3 refills | Status: DC

## 2018-01-11 MED ORDER — LOSARTAN POTASSIUM 50 MG PO TABS: 100.0000 mg | ORAL_TABLET | Freq: Every day | ORAL | 3 refills | Status: DC

## 2018-01-11 MED ORDER — MONTELUKAST SODIUM 10 MG PO TABS: 10.0000 mg | ORAL_TABLET | Freq: Every day | ORAL | 3 refills | Status: DC

## 2018-01-11 MED ORDER — POTASSIUM CITRATE ER 10 MEQ (1080 MG) PO TBCR: EXTENDED_RELEASE_TABLET | ORAL | 3 refills | Status: DC

## 2018-01-11 NOTE — Patient Instructions (Addendum)
High dose flu shot today  Prevnar 13 today   please check with your pharmacy to see if they have the shingrix vaccine. If they do- please get this immunization and update [place] by phone call or mychart with dates you receive the vaccine  Please stop by lab before you go- MMR evaluation, thyroid  Restart hydrochlorothiazide. See me in 3-6 weeks for blood pressure recheck.   Thrilled to meet you and have you as a patient- thanks for coming in today!

## 2018-01-11 NOTE — Assessment & Plan Note (Addendum)
S: poorly controlled in the past - with LDL as high as 177 in 2011. She stopped her statin due to reading about statins- she didn't have current issues. Had been on simvastatin. lipitor- felt like like arms would disconnect from body and diffuse weakness Lab Results  Component Value Date   CHOL 178 05/07/2013   HDL 49.60 05/07/2013   LDLCALC 107 (H) 05/07/2013   LDLDIRECT 177.4 06/25/2009   TRIG 108.0 05/07/2013   CHOLHDL 4 05/07/2013   A/P: need to get records- updated in June or July off statin.  Will want these abstracted then to eval 10 year CV risk

## 2018-01-11 NOTE — Assessment & Plan Note (Signed)
S: Not seeing urology right now. Remains on hctz and potassium citrate through PCP A/P: refilled these meds- glad no recurrence

## 2018-01-11 NOTE — Assessment & Plan Note (Addendum)
S: controlled poorly initially. She stopped her losartan 50mg  and hctz to see how BP would do BP Readings from Last 3 Encounters:  01/11/18 (!) 154/78  04/26/17 114/68  12/26/16 123/78  A/P: We discussed blood pressure goal of <140/90. Restart losartan 50mg  and hctz 25mg .

## 2018-01-11 NOTE — Progress Notes (Signed)
Phone: [phone]  Subjective:  Patient presents today to establish care.  Prior patient of [facility] Dr. Osborne Casco. Chief complaint-noted.   See problem oriented charting  The following were reviewed and entered/updated in epic: Past Medical History:  Diagnosis Date  . Chronic kidney disease    stones  . Hyperlipidemia   . Hypertension   . Nephrolithiasis    has required lithotripsy   Patient Active Problem List   Diagnosis Date Noted  . Migraine with aura 01/11/2018    Priority: Medium  . Insulin resistance 01/11/2018    Priority: Medium  . Glaucoma 02/02/2016    Priority: Medium  . Asthma, cough variant 02/02/2016    Priority: Medium  . Obstructive sleep apnea 02/02/2016    Priority: Medium  . Hyperlipidemia, unspecified 02/12/2006    Priority: Medium  . Essential hypertension 02/12/2006    Priority: Medium  . History of adenomatous polyp of colon 01/11/2018    Priority: Low  . Seasonal and perennial allergic rhinitis 04/25/2016    Priority: Low  . NEPHROLITHIASIS, HX OF 02/12/2006    Priority: Low   Past Surgical History:  Procedure Laterality Date  . ADENOIDECTOMY    . BREAST BIOPSY Left 07/09/2009   benign x2  . CESAREAN SECTION     x2  . LITHOTRIPSY  2011   kimbrough  . TONSILLECTOMY    . widom teeth extraction  1976    Family History  Problem Relation Age of Onset  . Hypertension Mother   . Heart failure Mother   . Asthma Mother   . COPD Mother        led to death early 61s  . Heart disease Father   . Heart failure Father        related to osteogenesis imperfecta. died at 53  . Osteogenesis imperfecta Father   . Osteogenesis imperfecta Sister        possibly related to this- led to death  . Breast cancer Sister        half sister  . Stroke Brother   . Healthy Brother   . Breast cancer Maternal Aunt   . Hyperlipidemia Son   . Obesity Maternal Grandmother   . Hyperlipidemia Paternal Grandmother   . Hypertension Paternal  Grandmother   . Hyperlipidemia Son   . Glaucoma Son   . Colon cancer Neg Hx     Medications- reviewed and updated Current Outpatient Medications  Medication Sig Dispense Refill  . azelastine (ASTELIN) 0.1 % nasal spray USE 1-2 SPRAYS IN EACH NARE AT BEDTIME 30 mL 2  . b complex vitamins tablet Take 1 tablet by mouth daily.    . calcium carbonate (CALCIUM 600) 600 MG TABS tablet Take 600 mg by mouth daily with breakfast.    . cholecalciferol (VITAMIN D) 1000 units tablet Take 3400 units every AM and 2000 units every PM    . CINNAMON PO Take 1,000 mg by mouth 2 (two) times daily.    . Coenzyme Q10 100 MG capsule Take by mouth.    . famotidine (PEPCID) 20 MG tablet Take 20 mg by mouth daily.    Marland Kitchen latanoprost (XALATAN) 0.005 % ophthalmic solution Place 1 drop into both eyes at bedtime.    . montelukast (SINGULAIR) 10 MG tablet Take 10 mg by mouth daily with breakfast.     . Omega-3 Fatty Acids (FISH OIL PO) Take 1 capsule by mouth 2 (two) times daily.    Marland Kitchen oxymetazoline (AFRIN) 0.05 % nasal spray  Place 2 sprays into both nostrils at bedtime. Per Dr. Erik Obey    . potassium citrate (UROCIT-K) 10 MEQ (1080 MG) SR tablet TAKE 2 TABLETS(=20MEQ)     DAILY (Patient taking differently: TAKE 1 TABLETS BID) 180 each 3  . TURMERIC PO Take 1 tablet by mouth daily.    . VENTOLIN HFA 108 (90 Base) MCG/ACT inhaler      Allergies-reviewed and updated Allergies  Allergen Reactions  . Iodine     REACTION: congestion  . Lipitor [Atorvastatin Calcium]     felt like like arms would disconnect from body and diffuse weakness    Social History   Social History Narrative   Married (husband Fritz Pickerel patient of Dr. Yong Channel ). Son 66 with CP in group home, son 24 lives in [place]. 2 grandkids in [place] 11 and 8 in 2019 (not expecting more).       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 Book club    ROS--Full ROS was completed Review of Systems  Constitutional: Negative for chills and fever.  HENT: Negative for ear discharge and nosebleeds.   Eyes: Negative for pain and discharge.  Respiratory: Negative for hemoptysis and sputum production.   Cardiovascular: Negative for chest pain and palpitations.  Gastrointestinal: Positive for abdominal pain (lower abdominal pain in recent weeks now resolved). Negative for blood in stool.  Genitourinary: Negative for dysuria and urgency.  Musculoskeletal: Negative for falls and neck pain.  Skin: Negative for itching and rash.       Hair falling out in shower at times. Thinning nails  Neurological: Negative for dizziness and headaches.  Endo/Heme/Allergies: Negative for polydipsia. Does not bruise/bleed easily.  Psychiatric/Behavioral: Negative for substance abuse and suicidal ideas.   Objective: BP (!) 154/78 (BP Location: Left Arm, Patient Position: Sitting, Cuff Size: Large)   Pulse 79   Temp 98.5 F (36.9 C) (Oral)   Ht _0  (1.549 m)   Wt 147 lb 3.2 oz (66.8 kg)   SpO2 97%   BMI 27.81 kg/m  Gen: NAD, resting comfortably HEENT: Mucous membranes are moist. Oropharynx normal. TM normal. Eyes: sclera and lids normal, PERRLA Neck: no thyromegaly, no cervical lymphadenopathy CV: RRR no murmurs rubs or gallops Lungs: CTAB no crackles, wheeze, rhonchi Abdomen: soft/nontender/nondistended/normal bowel sounds. No rebound or guarding.  Ext: no edema Skin: warm, dry, hyperpigmentation of skin around waist, neck, antecubital fossa Neuro: 5/5 strength in upper and lower extremities, normal gait and speech  Assessment/Plan:  Other notes: 1. 2003 colonoscopy. In 2013 they recommended 5 years due to adenomatous colon polyp 2. Pap 2014 with Dr. Leanne Chang- no abnormals history so no further follow up  3. Donated blood so already  screened HCV in early 1990s 4. mmr- 2 shots as adult and no immunity. 3rd shot as adult  Earlier this year but then had antibiotics shortly after and worried she doesn't have immunity- about to travel abroad to areas with measles and wants to have updated MMR evaluation titers today 5. hair falling out at time of showers, nails weaker. Started On biotin recently- wants to evaluate thyroid (done today) 6. Early aug- low abd and back pain- resolved on nitrofurantoin. Recurred some lower abdominal pain but now gone away again- could consider UA again in future if symptoms 7.  Within last month one episode of Tunnel vision under 10 sec, ears hot. No recurrence and no obvious neurological symptoms at this point other than long term migraine with aura which are unchanged.   Hyperlipidemia, unspecified S: poorly controlled in the past - with LDL as high as 177 in 2011. She stopped her statin due to reading about statins- she didn't have current issues. Had been on simvastatin. lipitor- felt like like arms would disconnect from body and diffuse weakness Lab Results  Component Value Date   CHOL 178 05/07/2013   HDL 49.60 05/07/2013   LDLCALC 107 (H) 05/07/2013   LDLDIRECT 177.4 06/25/2009   TRIG 108.0 05/07/2013   CHOLHDL 4 05/07/2013   A/P: need to get records- updated in June or July off statin.  Will want these abstracted then to eval 10 year CV risk   Essential hypertension S: controlled poorly initially. She stopped her losartan 30m and hctz to see how BP would do BP Readings from Last 3 Encounters:  01/11/18 (!) 154/78  04/26/17 114/68  12/26/16 123/78  A/P: We discussed blood pressure goal of <140/90. Restart losartan 571mand hctz 2569m    Asthma, cough variant S: follows with pulm but reports singulair through primary care A/P: refilled singulair for patient   NEPHROLITHIASIS, HX OF S: Not seeing urology right now. Remains on hctz and potassium citrate through PCP A/P: refilled  these meds- glad no recurrence   Insulin resistance S: reports a1c Down from 6.3 to 5.7 at outside office with keto diet change A/P: continue current rx  Future Appointments  Date Time Provider [place]0/08/2017  1:15 PM HunMarin OlpD [facility]  BP follow up   Hopeful to have records by follow up- labs abstracted in and health maintenance particularly immunizations updated.   Lab/Order associations: Measles, mumps, rubella (MMR) vaccination status unknown - Plan: Measles/Mumps/Rubella Immunity  Hyperlipidemia, unspecified hyperlipidemia type  History of adenomatous polyp of colon  Migraine with aura and without status migrainosus, not intractable  NEPHROLITHIASIS, HX OF  Essential hypertension  Obstructive sleep apnea  Insulin resistance  Hair changes - Plan: TSH  Change in nail appearance - Plan: TSH  Need for prophylactic vaccination and inoculation against influenza - Plan: Flu vaccine HIGH DOSE PF  Need for prophylactic vaccination against Streptococcus pneumoniae (pneumococcus) - Plan: Pneumococcal conjugate vaccine 13-valent IM  Asthma, cough variant  Meds ordered this encounter  Medications  . potassium citrate (UROCIT-K) 10 MEQ (1080 MG) SR tablet    Sig: TAKE 1 TABLETS BID    Dispense:  180 each    Refill:  3  . montelukast (SINGULAIR) 10 MG tablet    Sig: Take 1 tablet (10 mg total) by mouth daily with breakfast.    Dispense:  90 tablet    Refill:  3  . hydrochlorothiazide (HYDRODIURIL) 25 MG tablet    Sig: TAKE 1 TABLET DAILY    Dispense:  90 tablet    Refill:  3  . losartan (COZAAR) 50 MG tablet    Sig: Take 2 tablets (100 mg total) by mouth daily.    Dispense:  90 tablet    Refill:  3  . azelastine (ASTELIN) 0.1 % nasal spray    Sig: USE 1-2 SPRAYS IN EACH NARE AT BEDTIME    Dispense:  90 mL    Refill:  3    Return precautions advised. SteGarret ReddishD

## 2018-01-11 NOTE — Assessment & Plan Note (Signed)
S: follows with pulm but reports singulair through primary care A/P: refilled singulair for patient

## 2018-01-11 NOTE — Assessment & Plan Note (Signed)
S: reports a1c Down from 6.3 to 5.7 at outside office with keto diet change A/P: continue current rx

## 2018-01-14 LAB — MEASLES/MUMPS/RUBELLA IMMUNITY
Mumps IgG: 127 AU/mL
RUBELLA: 10 {index}
Rubeola IgG: >300 AU/mL

## 2018-02-08 ENCOUNTER — Ambulatory Visit (INDEPENDENT_AMBULATORY_CARE_PROVIDER_SITE_OTHER): Payer: Medicare Other | Admitting: Family Medicine

## 2018-02-08 ENCOUNTER — Encounter: Payer: Self-pay | Admitting: Family Medicine

## 2018-02-08 VITALS — BP 134/70 | HR 83 | Temp 98.6°F | Ht 61.0 in | Wt 148.0 lb

## 2018-02-08 DIAGNOSIS — L603 Nail dystrophy: Secondary | ICD-10-CM | POA: Insufficient documentation

## 2018-02-08 DIAGNOSIS — I10 Essential (primary) hypertension: Secondary | ICD-10-CM | POA: Diagnosis not present

## 2018-02-08 MED ORDER — LOSARTAN POTASSIUM 50 MG PO TABS: 50.0000 mg | ORAL_TABLET | Freq: Every day | ORAL | 3 refills | Status: DC

## 2018-02-08 NOTE — Assessment & Plan Note (Signed)
S: continued issues with hair loss, brittle nails. Weight is up 1 lb. She is actually slightly disappointed that thyroid levels are normal because at least it would explain her symptoms.  A/P:she is already on biotin and will continue. Hair loss- we talked about possible telogen effluvium. No obvious scarring- offered derm referral but she declines plus this would likely be low yield.

## 2018-02-08 NOTE — Progress Notes (Signed)
Subjective:  Rhonda Spence is a 67 y.o. year old very pleasant female patient who presents for/with See problem oriented charting ROS- No chest pain or shortness of breath. No headache or worsening blurry vision from baseline.   Past Medical History-  Patient Active Problem List   Diagnosis Date Noted  . Migraine with aura 01/11/2018    Priority: Medium  . Insulin resistance 01/11/2018    Priority: Medium  . Glaucoma 02/02/2016    Priority: Medium  . Asthma, cough variant 02/02/2016    Priority: Medium  . Obstructive sleep apnea 02/02/2016    Priority: Medium  . Hyperlipidemia, unspecified 02/12/2006    Priority: Medium  . Essential hypertension 02/12/2006    Priority: Medium  . History of adenomatous polyp of colon 01/11/2018    Priority: Low  . Seasonal and perennial allergic rhinitis 04/25/2016    Priority: Low  . NEPHROLITHIASIS, HX OF 02/12/2006    Priority: Low  . Brittle nails 02/08/2018    Medications- reviewed and updated Current Outpatient Medications  Medication Sig Dispense Refill  . azelastine (ASTELIN) 0.1 % nasal spray USE 1-2 SPRAYS IN EACH NARE AT BEDTIME 90 mL 3  . b complex vitamins tablet Take 1 tablet by mouth daily.    . calcium carbonate (CALCIUM 600) 600 MG TABS tablet Take 600 mg by mouth daily with breakfast.    . cholecalciferol (VITAMIN D) 1000 units tablet Take 3400 units every AM and 2000 units every PM    . CINNAMON PO Take 1,000 mg by mouth 2 (two) times daily.    . Coenzyme Q10 100 MG capsule Take by mouth.    . famotidine (PEPCID) 20 MG tablet Take 20 mg by mouth daily.    . hydrochlorothiazide (HYDRODIURIL) 25 MG tablet TAKE 1 TABLET DAILY 90 tablet 3  . latanoprost (XALATAN) 0.005 % ophthalmic solution Place 1 drop into both eyes at bedtime.    Marland Kitchen losartan (COZAAR) 50 MG tablet Take 1 tablet (50 mg total) by mouth daily. 90 tablet 3  . montelukast (SINGULAIR) 10 MG tablet Take 1 tablet (10 mg total) by mouth daily with breakfast. 90  tablet 3  . Omega-3 Fatty Acids (FISH OIL PO) Take 1 capsule by mouth 2 (two) times daily.    Marland Kitchen oxymetazoline (AFRIN) 0.05 % nasal spray Place 2 sprays into both nostrils at bedtime. Per Dr. Erik Obey    . potassium citrate (UROCIT-K) 10 MEQ (1080 MG) SR tablet TAKE 1 TABLETS BID 180 each 3  . TURMERIC PO Take 1 tablet by mouth daily.    . VENTOLIN HFA 108 (90 Base) MCG/ACT inhaler      No current facility-administered medications for this visit.     Objective: BP 134/70   Pulse 83   Temp 98.6 F (37 C) (Oral)   Ht 5\' 1"  (1.549 m)   Wt 148 lb (67.1 kg)   SpO2 97%   BMI 27.96 kg/m  Gen: NAD, resting comfortably CV: RRR no murmurs rubs or gallops Lungs: CTAB no crackles, wheeze, rhonchi Abdomen: soft/nontender/nondistended/normal bowel sounds. Ext: no edema Skin: warm, dry  Assessment/Plan:  Essential hypertension S: controlled on  losartan 50mg  and hctz 25mg . BP up last visit as she had stopped these to see how she would do. I wrote her rx as two 50mg  pills but she has only been taking one and it is controlled BP Readings from Last 3 Encounters:  02/08/18 134/70  01/11/18 (!) 154/78  04/26/17 114/68  A/P: We discussed  blood pressure goal of <140/90. Continue current meds:  Continue with losartan 50mg  alone (new rx sent in) as well as hctz 25mg   Brittle nails S: continued issues with hair loss, brittle nails. Weight is up 1 lb. She is actually slightly disappointed that thyroid levels are normal because at least it would explain her symptoms.  A/P:she is already on biotin and will continue. Hair loss- we talked about possible telogen effluvium. No obvious scarring- offered derm referral but she declines plus this would likely be low yield.   Future Appointments  Date Time Provider [place]  08/13/2018  9:40 AM Marin Olp, MD [facility]   Return in about 6 months (around 08/10/2018) for follow up- or sooner if needed.   Meds ordered this encounter   Medications  . losartan (COZAAR) 50 MG tablet    Sig: Take 1 tablet (50 mg total) by mouth daily.    Dispense:  90 tablet    Refill:  3    Return precautions advised.  Garret Reddish, MD

## 2018-02-08 NOTE — Patient Instructions (Addendum)
Health Maintenance Due  Topic Date Due  . COLONOSCOPY - please call for your colonoscopy 03/29/2017   Continue losartan at only 50mg  (I sent in a new prescription to reflect this)  Continue hydrochlorothiazide. Stay hydrated while on this as works mainly for blood pressure but also is a diuretic

## 2018-02-08 NOTE — Assessment & Plan Note (Signed)
S: controlled on  losartan 50mg  and hctz 25mg . BP up last visit as she had stopped these to see how she would do. I wrote her rx as two 50mg  pills but she has only been taking one and it is controlled BP Readings from Last 3 Encounters:  02/08/18 134/70  01/11/18 (!) 154/78  04/26/17 114/68  A/P: We discussed blood pressure goal of <140/90. Continue current meds:  Continue with losartan 50mg  alone (new rx sent in) as well as hctz 25mg 

## 2018-02-12 ENCOUNTER — Encounter: Payer: Self-pay | Admitting: Family Medicine

## 2018-03-05 ENCOUNTER — Encounter: Payer: Self-pay | Admitting: Gastroenterology

## 2018-04-01 ENCOUNTER — Other Ambulatory Visit: Payer: Self-pay

## 2018-04-01 ENCOUNTER — Ambulatory Visit (AMBULATORY_SURGERY_CENTER): Payer: Self-pay | Admitting: *Deleted

## 2018-04-01 VITALS — Ht 62.0 in | Wt 152.0 lb

## 2018-04-01 DIAGNOSIS — Z860100 Personal history of colon polyps, unspecified: Secondary | ICD-10-CM

## 2018-04-01 MED ORDER — PEG 3350-KCL-NA BICARB-NACL 420 G PO SOLR: 4000.0000 mL | Freq: Once | ORAL | 0 refills | Status: AC

## 2018-04-01 NOTE — Progress Notes (Signed)
No egg or soy allergy known to patient  No issues with past sedation with any surgeries  or procedures, no intubation problems  No diet pills per patient No home 02 use per patient  No blood thinners per patient  Pt denies issues with constipation  No A fib or A flutter  EMMI video sent to pt's e mail  

## 2018-04-18 ENCOUNTER — Encounter: Payer: Self-pay | Admitting: Gastroenterology

## 2018-04-18 ENCOUNTER — Ambulatory Visit (AMBULATORY_SURGERY_CENTER): Payer: Medicare Other | Admitting: Gastroenterology

## 2018-04-18 VITALS — BP 126/66 | HR 72 | Temp 98.0°F | Resp 9 | Ht 61.0 in | Wt 148.0 lb

## 2018-04-18 DIAGNOSIS — D125 Benign neoplasm of sigmoid colon: Secondary | ICD-10-CM

## 2018-04-18 DIAGNOSIS — Z1211 Encounter for screening for malignant neoplasm of colon: Secondary | ICD-10-CM | POA: Diagnosis not present

## 2018-04-18 DIAGNOSIS — D120 Benign neoplasm of cecum: Secondary | ICD-10-CM | POA: Diagnosis not present

## 2018-04-18 DIAGNOSIS — D123 Benign neoplasm of transverse colon: Secondary | ICD-10-CM

## 2018-04-18 DIAGNOSIS — Z86010 Personal history of colonic polyps: Secondary | ICD-10-CM

## 2018-04-18 MED ORDER — SODIUM CHLORIDE 0.9 % IV SOLN: 500.0000 mL | Freq: Once | INTRAVENOUS | Status: DC

## 2018-04-18 NOTE — Progress Notes (Signed)
Pt's states no medical or surgical changes since previsit or office visit. 

## 2018-04-18 NOTE — Progress Notes (Signed)
Called to room to assist during endoscopic procedure.  Patient ID and intended procedure confirmed with present staff. Received instructions for my participation in the procedure from the performing physician.  

## 2018-04-18 NOTE — Op Note (Signed)
[facility] Patient Name: Rhonda Spence Procedure Date: 04/18/2018 9:33 AM MRN: [id number] Endoscopist: Ladene Artist , MD Age: 67 Referring MD:  Date of Birth: 08/07/1950 Gender: Female Account #: [id number] Procedure:                Colonoscopy Indications:              Surveillance: Personal history of adenomatous                            polyps on last colonoscopy > 5 years ago Medicines:                Monitored Anesthesia Care Procedure:                Pre-Anesthesia Assessment:                           - Prior to the procedure, a History and Physical                            was performed, and patient medications and                            allergies were reviewed. The patient's tolerance of                            previous anesthesia was also reviewed. The risks                            and benefits of the procedure and the sedation                            options and risks were discussed with the patient.                            All questions were answered, and informed consent                            was obtained. Prior Anticoagulants: The patient has                            taken no previous anticoagulant or antiplatelet                            agents. ASA Grade Assessment: II - A patient with                            mild systemic disease. After reviewing the risks                            and benefits, the patient was deemed in                            satisfactory condition to undergo the procedure.  After obtaining informed consent, the colonoscope                            was passed under direct vision. Throughout the                            procedure, the patient's blood pressure, pulse, and                            oxygen saturations were monitored continuously. The                            Colonoscope was introduced through the anus and                            advanced to the the  cecum, identified by                            appendiceal orifice and ileocecal valve. The                            ileocecal valve, appendiceal orifice, and rectum                            were photographed. The quality of the bowel                            preparation was adequate. The colonoscopy was                            performed without difficulty. The patient tolerated                            the procedure well. Scope In: 9:44:26 AM Scope Out: 9:59:24 AM Scope Withdrawal Time: 0 hours 11 minutes 27 seconds  Total Procedure Duration: 0 hours 14 minutes 58 seconds  Findings:                 The perianal and digital rectal examinations were                            normal.                           Two sessile polyps were found in the transverse                            colon and cecum. The polyps were 7 to 8 mm in size.                            These polyps were removed with a cold snare.                            Resection and retrieval were complete.  A 5 mm polyp was found in the sigmoid colon. The                            polyp was sessile. The polyp was removed with a                            cold biopsy forceps. Resection and retrieval were                            complete.                           Multiple medium-mouthed diverticula were found in                            the entire colon. There was narrowing of the colon                            in association with the diverticular opening. There                            was evidence of diverticular spasm. There was no                            evidence of diverticular bleeding.                           Internal hemorrhoids were found during                            retroflexion. The hemorrhoids were small and Grade                            I (internal hemorrhoids that do not prolapse).                           The exam was otherwise without abnormality on                             direct and retroflexion views. Complications:            No immediate complications. Estimated blood loss:                            None. Estimated Blood Loss:     Estimated blood loss: none. Impression:               - Two 7 to 8 mm polyps in the transverse colon and                            in the cecum, removed with a cold snare. Resected                            and retrieved.                           -  One 5 mm polyp in the sigmoid colon, removed with                            a cold biopsy forceps. Resected and retrieved.                           - Moderate diverticulosis in the entire examined                            colon. There was narrowing of the colon in                            association with the diverticular opening. There                            was evidence of diverticular spasm. There was no                            evidence of diverticular bleeding.                           - Internal hemorrhoids.                           - The examination was otherwise normal on direct                            and retroflexion views. Recommendation:           - Repeat colonoscopy in 5 years for surveillance.                           - Patient has a contact number available for                            emergencies. The signs and symptoms of potential                            delayed complications were discussed with the                            patient. Return to normal activities tomorrow.                            Written discharge instructions were provided to the                            patient.                           - High fiber diet long term.                           - Continue present medications.                           -  Await pathology results. Ladene Artist, MD 04/18/2018 10:02:55 AM This report has been signed electronically.

## 2018-04-18 NOTE — Progress Notes (Signed)
To PACU, VSS. Report to [email or web address] 

## 2018-04-18 NOTE — Patient Instructions (Signed)
YOU HAD AN ENDOSCOPIC PROCEDURE TODAY AT THE [facility]:   Refer to the procedure report that was given to you for any specific questions about what was found during the examination.  If the procedure report does not answer your questions, please call your gastroenterologist to clarify.  If you requested that your care partner not be given the details of your procedure findings, then the procedure report has been included in a sealed envelope for you to review at your convenience later.  YOU SHOULD EXPECT: Some feelings of bloating in the abdomen. Passage of more gas than usual.  Walking can help get rid of the air that was put into your GI tract during the procedure and reduce the bloating. If you had a lower endoscopy (such as a colonoscopy or flexible sigmoidoscopy) you may notice spotting of blood in your stool or on the toilet paper. If you underwent a bowel prep for your procedure, you may not have a normal bowel movement for a few days.  Please Note:  You might notice some irritation and congestion in your nose or some drainage.  This is from the oxygen used during your procedure.  There is no need for concern and it should clear up in a day or so.  SYMPTOMS TO REPORT IMMEDIATELY:   Following lower endoscopy (colonoscopy or flexible sigmoidoscopy):  Excessive amounts of blood in the stool  Significant tenderness or worsening of abdominal pains  Swelling of the abdomen that is new, acute  Fever of 100F or higher  For urgent or emergent issues, a gastroenterologist can be reached at any hour by calling [phone].   DIET:  We do recommend a small meal at first, but then you may proceed to your regular diet.  Drink plenty of fluids but you should avoid alcoholic beverages for 24 hours.  ACTIVITY:  You should plan to take it easy for the rest of today and you should NOT DRIVE or use heavy machinery until tomorrow (because of the sedation medicines used during the test).     FOLLOW UP: Our staff will call the number listed on your records the next business day following your procedure to check on you and address any questions or concerns that you may have regarding the information given to you following your procedure. If we do not reach you, we will leave a message.  However, if you are feeling well and you are not experiencing any problems, there is no need to return our call.  We will assume that you have returned to your regular daily activities without incident.  If any biopsies were taken you will be contacted by phone or by letter within the next 1-3 weeks.  Please call us at [phone] if you have not heard about the biopsies in 3 weeks.    SIGNATURES/CONFIDENTIALITY: You and/or your care partner have signed paperwork which will be entered into your electronic medical record.  These signatures attest to the fact that that the information above on your After Visit Summary has been reviewed and is understood.  Full responsibility of the confidentiality of this discharge information lies with you and/or your care-partner. 

## 2018-04-19 ENCOUNTER — Telehealth: Payer: Self-pay

## 2018-04-19 NOTE — Telephone Encounter (Signed)
  Follow up Call-  Call back number 04/18/2018  Post procedure Call Back phone  # [phone]  Permission to leave phone message Yes  Some recent data might be hidden     Patient questions:  Do you have a fever, pain , or abdominal swelling? No. Pain Score  0 *  Have you tolerated food without any problems? Yes.    Have you been able to return to your normal activities? Yes.    Do you have any questions about your discharge instructions: Diet   No. Medications  No. Follow up visit  No.  Do you have questions or concerns about your Care? Yes.    Actions: * If pain score is 4 or above: No action needed, pain <4.

## 2018-04-26 ENCOUNTER — Encounter: Payer: Self-pay | Admitting: Gastroenterology

## 2018-06-05 ENCOUNTER — Encounter: Payer: Self-pay | Admitting: Family Medicine

## 2018-06-08 ENCOUNTER — Encounter: Payer: Self-pay | Admitting: Family Medicine

## 2018-06-08 DIAGNOSIS — E559 Vitamin D deficiency, unspecified: Secondary | ICD-10-CM | POA: Insufficient documentation

## 2018-08-12 ENCOUNTER — Encounter: Payer: Self-pay | Admitting: Family Medicine

## 2018-08-13 ENCOUNTER — Ambulatory Visit (INDEPENDENT_AMBULATORY_CARE_PROVIDER_SITE_OTHER): Payer: Medicare Other | Admitting: Family Medicine

## 2018-08-13 ENCOUNTER — Encounter: Payer: Self-pay | Admitting: Family Medicine

## 2018-08-13 VITALS — BP 144/81 | HR 66 | Ht 61.0 in | Wt 153.6 lb

## 2018-08-13 DIAGNOSIS — L659 Nonscarring hair loss, unspecified: Secondary | ICD-10-CM

## 2018-08-13 DIAGNOSIS — J302 Other seasonal allergic rhinitis: Secondary | ICD-10-CM

## 2018-08-13 DIAGNOSIS — I10 Essential (primary) hypertension: Secondary | ICD-10-CM | POA: Diagnosis not present

## 2018-08-13 DIAGNOSIS — L603 Nail dystrophy: Secondary | ICD-10-CM

## 2018-08-13 DIAGNOSIS — J3089 Other allergic rhinitis: Secondary | ICD-10-CM

## 2018-08-13 DIAGNOSIS — J45991 Cough variant asthma: Secondary | ICD-10-CM

## 2018-08-13 MED ORDER — AZELASTINE HCL 0.1 % NA SOLN: NASAL | 3 refills | Status: DC

## 2018-08-13 MED ORDER — VENTOLIN HFA 108 (90 BASE) MCG/ACT IN AERS: 2.0000 | INHALATION_SPRAY | RESPIRATORY_TRACT | 3 refills | Status: DC | PRN

## 2018-08-13 NOTE — Progress Notes (Signed)
Phone [phone]   Subjective:  Virtual visit via Video note This visit type was conducted due to national recommendations for restrictions regarding the COVID-19 Pandemic (e.g. social distancing).  This format is felt to be most appropriate for this patient at this time balancing risks to patient and risks to population by having him in for in person visit.  All issues noted in this document were discussed and addressed.  No physical exam was performed (except for noted visual exam or audio findings with Telehealth visits).  The patient has consented to conduct a Telehealth visit and understands insurance will be billed.   Our team/I connected with Rhonda Spence on 08/13/18 at  9:40 AM EDT by a video enabled telemedicine application ([email or web address]) and verified that I am speaking with the correct person using two identifiers.  Location patient: [facility] Location provider: [facility], office Persons participating in the virtual visit:  patient  Our team/I discussed the limitations of evaluation and management by telemedicine and the availability of in person appointments. In light of current covid-19 pandemic, patient also understands that we are trying to protect them by minimizing in office contact if at all possible.  The patient expressed consent for telemedicine visit and agreed to proceed. Patient understands insurance will be billed.   ROS-complains of chest tightness with indoor dust exposure-have not tried albuterol yet.  Otherwise no reported chest pain.  Notes hair loss and brittle nails- nails have been improved and his hair seems more stable lately  Past Medical History-  Patient Active Problem List   Diagnosis Date Noted  . Migraine with aura 01/11/2018    Priority: Medium  . Insulin resistance 01/11/2018    Priority: Medium  . Glaucoma 02/02/2016    Priority: Medium  . Asthma, cough variant 02/02/2016    Priority: Medium  . Obstructive sleep apnea 02/02/2016    Priority:  Medium  . Hyperlipidemia, unspecified 02/12/2006    Priority: Medium  . Essential hypertension 02/12/2006    Priority: Medium  . History of adenomatous polyp of colon 01/11/2018    Priority: Low  . Seasonal and perennial allergic rhinitis 04/25/2016    Priority: Low  . NEPHROLITHIASIS, HX OF 02/12/2006    Priority: Low  . Vitamin D deficiency 06/08/2018  . Brittle nails 02/08/2018    Medications- reviewed and updated Current Outpatient Medications  Medication Sig Dispense Refill  . azelastine (ASTELIN) 0.1 % nasal spray USE 1-2 SPRAYS IN EACH NARE AT BEDTIME 90 mL 3  . b complex vitamins tablet Take 1 tablet by mouth daily.    Marland Kitchen BIOTIN PO Take by mouth.    . calcium carbonate (CALCIUM 600) 600 MG TABS tablet Take 600 mg by mouth daily with breakfast.    . cholecalciferol (VITAMIN D) 1000 units tablet 5,000 Units. Take 3400 units every AM and 2000 units every PM    . CINNAMON PO Take 1,000 mg by mouth 2 (two) times daily.    . Coenzyme Q10 100 MG capsule Take by mouth.    . famotidine (PEPCID) 20 MG tablet Take 20 mg by mouth daily.    . hydrochlorothiazide (HYDRODIURIL) 25 MG tablet TAKE 1 TABLET DAILY 90 tablet 3  . latanoprost (XALATAN) 0.005 % ophthalmic solution Place 1 drop into both eyes at bedtime.    Marland Kitchen losartan (COZAAR) 50 MG tablet Take 1 tablet (50 mg total) by mouth daily. 90 tablet 3  . montelukast (SINGULAIR) 10 MG tablet Take 1 tablet (10 mg total) by mouth  daily with breakfast. 90 tablet 3  . Omega-3 Fatty Acids (FISH OIL PO) Take 1 capsule by mouth 2 (two) times daily.    Marland Kitchen oxymetazoline (AFRIN) 0.05 % nasal spray Place 2 sprays into both nostrils at bedtime. Per Dr. Erik Obey    . potassium citrate (UROCIT-K) 10 MEQ (1080 MG) SR tablet TAKE 1 TABLETS BID 180 each 3  . TURMERIC PO Take 1 tablet by mouth daily.    . VENTOLIN HFA 108 (90 Base) MCG/ACT inhaler Inhale 2 puffs into the lungs every 4 (four) hours as needed for wheezing or shortness of breath. 18 g 3   No  current facility-administered medications for this visit.      Objective:  BP (!) 144/81 (BP Location: Left Arm)   Pulse 66   Ht 5\' 1"  (1.549 m)   Wt 153 lb 9.6 oz (69.7 kg)   BMI 29.02 kg/m  Gen: NAD, resting comfortably Lungs: nonlabored, normal respiratory rate  Skin: appears dry, no obvious rash Normal speech     Assessment and Plan   #hypertension S: controlled on losartan 50 and hctz 25 mg.  BP Readings from Last 3 Encounters:  08/13/18 (!) 144/81  04/18/18 126/66  02/08/18 134/70  A/P: Poor control on home check today-numbers actually went up on repeat.  We opted to do some home monitoring for the next 7 to 14 days-if fails to improve may increase losartan to 100 mg.  She is using a wrist cuff which is not ideal but I think it is reasonable to use that if at all possible to keep her out of the office given current COVID-19 pandemic  # Brittle nails/hair loss S:patient last visit complained of hair loss, brittle nails, slight weight gain. TSh was normal at that time. Patient was on biotin at that time and wanted to continue- we had talked about possible telogen effluvium.   Nails seem to do better on collagen and biotin- intermittently improved at least. She has started a multi collagen- seems to slow loss but not getting hair growth back. Hair dresser was shocked by level of hair loss on last check.   Lost 23 lbs with keto diet in January- did not change diet but has still gained 6 lbs. Exercise slightly less in winter- not out gardening. Has really affected her self esteem.  A/P: Brittle nails-improved.  Hair loss-seems to not be worsening at present but has had continued/more significant loss.  This may still have been telogen effluvium but given level of patient distress we consider dermatology.  I wonder if TSH may be obscured by her biotin- we opted to check TSH in a month and have her remain off biotin during this time. tsh in a month off biotiin.  We opted for  dermatology visit if symptoms do not improve  #Cough variant asthma/allergies S: Prn with Dr. Annamaria Boots right now- asks for me to refill astelin.  Astelin seems to really help with allergies overall.  She still has had some intermittent issues-Indoor dust exposure/cleaning- gets mildest of chest tightness but she needs a refill on Ventolin-has not tried that yet A/P: Perhaps mild poor control of asthma-I asked patient to try her Ventolin when she has the indoor dust exposure to see if that helps-if it does not I want her to alert [place] immediately-may need to consider EKG or cardiac work-up given age and hypertension.  Sounds likely to be asthma but will try the albuterol.  For allergies- reasonably controlled-I refilled her Astelin.  She is also on Singulair-went over stronger warnings about suicidal ideation-she was fortunately already aware of these and has not had any issues.  42-month follow-up would be reasonable-we did not specifically discussed this-we will set 5 months reminder to see if patient scheduled  Lab/Order associations: Essential hypertension  Asthma, cough variant  Seasonal and perennial allergic rhinitis  Hair loss - Plan: TSH  Brittle nails  Meds ordered this encounter  Medications  . azelastine (ASTELIN) 0.1 % nasal spray    Sig: USE 1-2 SPRAYS IN EACH NARE AT BEDTIME    Dispense:  90 mL    Refill:  3  . VENTOLIN HFA 108 (90 Base) MCG/ACT inhaler    Sig: Inhale 2 puffs into the lungs every 4 (four) hours as needed for wheezing or shortness of breath.    Dispense:  18 g    Refill:  3    Return precautions advised.  Garret Reddish, MD

## 2018-08-13 NOTE — Patient Instructions (Addendum)
Video visit

## 2018-08-26 ENCOUNTER — Encounter: Payer: Self-pay | Admitting: Family Medicine

## 2018-08-26 MED ORDER — LOSARTAN POTASSIUM 100 MG PO TABS: 100.0000 mg | ORAL_TABLET | Freq: Every day | ORAL | 3 refills | Status: DC

## 2018-09-03 ENCOUNTER — Encounter: Payer: Self-pay | Admitting: Family Medicine

## 2018-09-03 ENCOUNTER — Other Ambulatory Visit: Payer: Self-pay

## 2018-09-03 ENCOUNTER — Telehealth: Payer: Self-pay | Admitting: Family Medicine

## 2018-09-03 MED ORDER — MONTELUKAST SODIUM 10 MG PO TABS: 10.0000 mg | ORAL_TABLET | Freq: Every day | ORAL | 3 refills | Status: DC

## 2018-09-03 NOTE — Telephone Encounter (Signed)
Montelukast 10mg  Filled 01/21/18  #90/3 Last OV 08/13/18 Next OV 09/12/18

## 2018-09-03 NOTE — Telephone Encounter (Signed)
MEDICATION: montelukast (SINGULAIR) 10 MG tablet  PHARMACY:  [facility], [place]  IS THIS A 90 DAY SUPPLY : Yes    IS PATIENT OUT OF MEDICATION: No  IF NOT; HOW MUCH IS LEFT: 6 doses left   LAST APPOINTMENT DATE: @4 /11/2018  NEXT APPOINTMENT DATE:@5 /11/2018  OTHER COMMENTS:    **Let patient know to contact pharmacy at the end of the day to make sure medication is ready. **  ** Please notify patient to allow 48-72 hours to process**  **Encourage patient to contact the pharmacy for refills or they can request refills through [facility]**

## 2018-09-04 ENCOUNTER — Other Ambulatory Visit: Payer: Self-pay | Admitting: Family Medicine

## 2018-09-04 DIAGNOSIS — Z1231 Encounter for screening mammogram for malignant neoplasm of breast: Secondary | ICD-10-CM

## 2018-09-04 NOTE — Telephone Encounter (Signed)
I see Rhonda Spence on your schedule on May 7th for a doxyme, but she is not on the lab schedule for that day.  Did you want her to have a TSH lab that day, which is 1 month out from her last ov with you,  Did you want to have her do both on the same day?

## 2018-09-12 ENCOUNTER — Ambulatory Visit: Payer: Medicare Other | Admitting: Family Medicine

## 2018-09-12 ENCOUNTER — Other Ambulatory Visit: Payer: Self-pay

## 2018-09-12 ENCOUNTER — Other Ambulatory Visit (INDEPENDENT_AMBULATORY_CARE_PROVIDER_SITE_OTHER): Payer: Medicare Other

## 2018-09-12 DIAGNOSIS — L659 Nonscarring hair loss, unspecified: Secondary | ICD-10-CM

## 2018-09-12 LAB — TSH: TSH: 1.32 u[IU]/mL (ref 0.35–4.50)

## 2018-09-17 ENCOUNTER — Telehealth: Payer: Self-pay | Admitting: Family Medicine

## 2018-09-17 NOTE — Telephone Encounter (Signed)
Patient dropped off 17 blood pressures.  15 of these blood pressures are above goal of 175 for systolic.  She has several numbers in the 150s and even 1 number into the 160s.  Her overall average is above 140/90.  Team-I would recommend a follow-up video visit to discuss next steps given elevated blood pressure even with increased losartan to 100 mg.  Please confirm she is also taking losartan 25 mg before scheduling visit.

## 2018-09-18 NOTE — Telephone Encounter (Signed)
I left a detailed message for pt to call office back.

## 2018-09-19 NOTE — Telephone Encounter (Signed)
See note

## 2018-09-19 NOTE — Telephone Encounter (Signed)
Spoke to pt and scheduled f/u visit. No further action needed!

## 2018-09-19 NOTE — Telephone Encounter (Signed)
I left a message to call office back.

## 2018-09-19 NOTE — Telephone Encounter (Signed)
Called both numbers on pt chart but no answer. Will call back later.

## 2018-09-19 NOTE — Telephone Encounter (Signed)
Pt called back. °

## 2018-09-23 ENCOUNTER — Encounter: Payer: Self-pay | Admitting: Family Medicine

## 2018-09-23 ENCOUNTER — Ambulatory Visit (INDEPENDENT_AMBULATORY_CARE_PROVIDER_SITE_OTHER): Payer: Medicare Other | Admitting: Family Medicine

## 2018-09-23 VITALS — BP 142/80 | HR 72 | Ht 61.0 in | Wt 152.8 lb

## 2018-09-23 DIAGNOSIS — I10 Essential (primary) hypertension: Secondary | ICD-10-CM | POA: Diagnosis not present

## 2018-09-23 MED ORDER — CHLORTHALIDONE 25 MG PO TABS: 25.0000 mg | ORAL_TABLET | Freq: Every day | ORAL | 3 refills | Status: DC

## 2018-09-23 NOTE — Progress Notes (Signed)
Phone [phone]   Subjective:  Virtual visit via Video note. Chief complaint: Chief Complaint  Patient presents with  . Follow-up- very mild headache   This visit type was conducted due to national recommendations for restrictions regarding the COVID-19 Pandemic (e.g. social distancing).  This format is felt to be most appropriate for this patient at this time balancing risks to patient and risks to population by having him in for in person visit.  No physical exam was performed (except for noted visual exam or audio findings with Telehealth visits).    Our team/I connected with Elnita Maxwell Marcello at  9:20 AM EDT by a video enabled telemedicine application (doxy.me or caregility through epic) and verified that I am speaking with the correct person using two identifiers.  Location patient: [place] Location provider: [facility], office Persons participating in the virtual visit:  patient  Our team/I discussed the limitations of evaluation and management by telemedicine and the availability of in person appointments. In light of current covid-19 pandemic, patient also understands that we are trying to protect them by minimizing in office contact if at all possible.  The patient expressed consent for telemedicine visit and agreed to proceed. Patient understands insurance will be billed.   ROS-  mild headaches when BP is high. No chest pain or shrotness of breath. Occasional lightheadedness with standing  Past Medical History-  Patient Active Problem List   Diagnosis Date Noted  . Migraine with aura 01/11/2018    Priority: Medium  . Insulin resistance 01/11/2018    Priority: Medium  . Glaucoma 02/02/2016    Priority: Medium  . Asthma, cough variant 02/02/2016    Priority: Medium  . Obstructive sleep apnea of adult 02/02/2016    Priority: Medium  . Hyperlipidemia, unspecified 02/12/2006    Priority: Medium  . Essential hypertension 02/12/2006    Priority: Medium  . History of  adenomatous polyp of colon 01/11/2018    Priority: Low  . Seasonal and perennial allergic rhinitis 04/25/2016    Priority: Low  . NEPHROLITHIASIS, HX OF 02/12/2006    Priority: Low  . Vitamin D deficiency 06/08/2018  . Brittle nails 02/08/2018  . Hair loss 05/08/2017  . Hypertrophy of nasal turbinates 02/01/2017  . Allergic rhinitis 09/28/2016  . Bilateral temporomandibular joint pain 09/28/2016  . Ear pain, left 09/28/2016  . Primary open angle glaucoma of both eyes, mild stage 01/12/2015  . Age-related nuclear cataract of both eyes 01/12/2015  . Glaucoma suspect of both eyes 05/20/2013    Medications- reviewed and updated Current Outpatient Medications  Medication Sig Dispense Refill  . azelastine (ASTELIN) 0.1 % nasal spray USE 1-2 SPRAYS IN EACH NARE AT BEDTIME 90 mL 3  . b complex vitamins tablet Take 1 tablet by mouth daily.    Marland Kitchen BIOTIN PO Take by mouth.    . calcium carbonate (CALCIUM 600) 600 MG TABS tablet Take 600 mg by mouth daily with breakfast.    . cholecalciferol (VITAMIN D) 1000 units tablet 5,000 Units. Take 3400 units every AM and 2000 units every PM    . CINNAMON PO Take 1,000 mg by mouth 2 (two) times daily.    . Coenzyme Q10 100 MG capsule Take by mouth.    . famotidine (PEPCID) 20 MG tablet Take 20 mg by mouth daily.    . hydrochlorothiazide (HYDRODIURIL) 25 MG tablet TAKE 1 TABLET DAILY 90 tablet 3  . latanoprost (XALATAN) 0.005 % ophthalmic solution Place 1 drop into both eyes at bedtime.    Marland Kitchen  losartan (COZAAR) 100 MG tablet Take 1 tablet (100 mg total) by mouth daily. 90 tablet 3  . montelukast (SINGULAIR) 10 MG tablet Take 1 tablet (10 mg total) by mouth daily with breakfast. 90 tablet 3  . Omega-3 Fatty Acids (FISH OIL PO) Take 1 capsule by mouth 2 (two) times daily.    Marland Kitchen oxymetazoline (AFRIN) 0.05 % nasal spray Place 2 sprays into both nostrils at bedtime. Per Dr. Erik Obey    . potassium citrate (UROCIT-K) 10 MEQ (1080 MG) SR tablet TAKE 1 TABLETS BID 180  each 3  . TURMERIC PO Take 1 tablet by mouth daily.    . VENTOLIN HFA 108 (90 Base) MCG/ACT inhaler Inhale 2 puffs into the lungs every 4 (four) hours as needed for wheezing or shortness of breath. 18 g 3     Objective:  BP (!) 142/80 (BP Location: Left Arm, Patient Position: Sitting, Cuff Size: Normal) Comment (Cuff Size): n  Pulse 72   Ht 5\' 1"  (1.549 m)   Wt 152 lb 12 oz (69.3 kg)   BMI 28.86 kg/m  self reported vitals Gen: NAD, resting comfortably Lungs: nonlabored, normal respiratory rate  Skin: appears dry, no obvious rash    Assessment and Plan   #hypertension S: controlled on losartan 100mg  and hctz 25 mg.   Home monitoring primarily in 140s- reading this morning is the best she has seen.   Feels like doing reasonably well with healthy eating and regular exercise. Some stress with husband just retiring plus covid 106. Thinks maybe when she can get out more that may help.   With high blood pressure- gets very mild headache. Occasional mild lightheadedness BP Readings from Last 3 Encounters:  09/23/18 (!) 142/80  08/13/18 (!) 144/81  04/18/18 126/66  A/P: poor control. Continue losartan 100mg . Stop hctz 25mg . Start chlorthalidone 25mg . Update me in 2 weeks by mychart. Advised awv in  3-6 months- she would like to do it in person so wants to hold off for now  Other notes: 1.wants to see hairdresser first before considering seeing dermatology- she thinks she has more hair- wants to hold off for now   Future Appointments  Date Time Provider [place]  10/28/2018  4:50 PM [facility] 3 [facility]   Lab/Order associations: No diagnosis found.  Meds ordered this encounter  Medications  . chlorthalidone (HYGROTON) 25 MG tablet    Sig: Take 1 tablet (25 mg total) by mouth daily.    Dispense:  30 tablet    Refill:  3    Return precautions advised.  Garret Reddish, MD

## 2018-09-23 NOTE — Patient Instructions (Signed)
There are no preventive care reminders to display for this patient.  Depression screen PHQ 2/9 01/11/2018  Decreased Interest 0  Down, Depressed, Hopeless 1  PHQ - 2 Score 1   Video visit

## 2018-10-07 ENCOUNTER — Encounter: Payer: Self-pay | Admitting: Family Medicine

## 2018-10-22 ENCOUNTER — Encounter: Payer: Self-pay | Admitting: Family Medicine

## 2018-10-28 ENCOUNTER — Other Ambulatory Visit: Payer: Self-pay

## 2018-10-28 ENCOUNTER — Ambulatory Visit
Admission: RE | Admit: 2018-10-28 | Discharge: 2018-10-28 | Disposition: A | Discharge status: Home / Self Care | Payer: Medicare Other | Source: Ambulatory Visit | Attending: Family Medicine | Admitting: Family Medicine

## 2018-10-28 DIAGNOSIS — Z1231 Encounter for screening mammogram for malignant neoplasm of breast: Secondary | ICD-10-CM

## 2018-10-30 ENCOUNTER — Other Ambulatory Visit: Payer: Self-pay | Admitting: Internal Medicine

## 2018-10-30 ENCOUNTER — Other Ambulatory Visit: Payer: Self-pay | Admitting: Family Medicine

## 2018-10-30 DIAGNOSIS — N644 Mastodynia: Secondary | ICD-10-CM

## 2018-11-06 ENCOUNTER — Ambulatory Visit: Payer: Medicare Other

## 2018-11-06 ENCOUNTER — Other Ambulatory Visit: Payer: Self-pay

## 2018-11-06 ENCOUNTER — Ambulatory Visit
Admission: RE | Admit: 2018-11-06 | Discharge: 2018-11-06 | Disposition: A | Discharge status: Home / Self Care | Payer: Medicare Other | Source: Ambulatory Visit | Attending: Family Medicine | Admitting: Family Medicine

## 2018-11-06 DIAGNOSIS — R922 Inconclusive mammogram: Secondary | ICD-10-CM | POA: Diagnosis not present

## 2018-11-06 DIAGNOSIS — N644 Mastodynia: Secondary | ICD-10-CM

## 2018-11-07 ENCOUNTER — Encounter: Payer: Self-pay | Admitting: Family Medicine

## 2018-11-08 ENCOUNTER — Encounter: Payer: Self-pay | Admitting: Family Medicine

## 2018-11-11 MED ORDER — AMLODIPINE BESYLATE 2.5 MG PO TABS: 2.5000 mg | ORAL_TABLET | Freq: Every day | ORAL | 3 refills | Status: DC

## 2018-11-11 NOTE — Telephone Encounter (Signed)
Rx sent to [facility] per pt request. Forwarding to Dr. Yong Channel to advise regarding medication concerns.

## 2018-11-12 DIAGNOSIS — H2513 Age-related nuclear cataract, bilateral: Secondary | ICD-10-CM | POA: Diagnosis not present

## 2018-11-12 DIAGNOSIS — H40023 Open angle with borderline findings, high risk, bilateral: Secondary | ICD-10-CM | POA: Diagnosis not present

## 2018-11-29 ENCOUNTER — Encounter: Payer: Self-pay | Admitting: Family Medicine

## 2018-12-05 MED ORDER — CHLORTHALIDONE 25 MG PO TABS: 25.0000 mg | ORAL_TABLET | Freq: Every day | ORAL | 3 refills | Status: DC

## 2018-12-05 MED ORDER — AMLODIPINE BESYLATE 10 MG PO TABS: 10.0000 mg | ORAL_TABLET | Freq: Every day | ORAL | 3 refills | Status: DC

## 2018-12-05 NOTE — Telephone Encounter (Signed)
Pt called and said she read Dr. Ansel Bong message and will increase Amlodipine to 10 mg daily and also asked if Rx for Chlorthalidone 25 mg be sent in as a 90 day supply. Told pt I will send both Rx's to the pharmacy for her. Pt verbalized understanding/ Rx's sent.

## 2018-12-05 NOTE — Telephone Encounter (Signed)
Left detailed message on personal voicemail that Dr. Yong Channel sent a message to you. Please My Chart or call office with your response regarding medication.

## 2018-12-05 NOTE — Telephone Encounter (Signed)
I let this patient know that you were on vacation and would see their message when you returned to the office.   Thanks,  Modena Nunnery

## 2018-12-09 ENCOUNTER — Telehealth: Payer: Self-pay | Admitting: Family Medicine

## 2018-12-09 NOTE — Telephone Encounter (Signed)
Per last MyChart message 11/29/18, pt should be taking 5 mg daily. It was increased from 2.5 mg daily. Pt says that she picked up 10 mg from her pharmacy, wants to know if she she take this tablet whole or if it would be OK to cut it in half, it is not scored. Confirmed that pt still has 2.5 mg dose available at home so she will continue taking 2 tablets of the 2.5 mg daily until she hears back when Dr. Yong Channel returns to the office tomorrow.   Dr. Yong Channel -  Please confirm that pt is to take Amlodipine 5 mg daily.  I will call to check with the pharmacy regarding cutting the Amlodipine 10 mg in half if you are unsure if its OK to do so.

## 2018-12-09 NOTE — Telephone Encounter (Signed)
You are correct- should be 5 mg- have her either take 2 of the 2.5mg  tablets or half of 10mg  tablet.   It appears when I sent to team to increase to 5 mg- they may have interpreted as increase to 10mg - we still may have to go to 10mg - have her update me in 2 weeks but hopefully things will look good on 5 mg dose

## 2018-12-09 NOTE — Telephone Encounter (Signed)
See below

## 2018-12-09 NOTE — Telephone Encounter (Signed)
Pt is calling to confirm  She suppose to take amlodipine 10 mg or 5 mg. Pt just pick up 10 mg for [employer]

## 2018-12-10 NOTE — Telephone Encounter (Signed)
Called pt and advised. Pt verbalized understanding. She taking 5 mg daily, continue to monitor BP over the next 2 weeks, and touch base in 2 weeks with BP readings.

## 2018-12-19 ENCOUNTER — Encounter: Payer: Self-pay | Admitting: Family Medicine

## 2018-12-23 ENCOUNTER — Encounter: Payer: Self-pay | Admitting: Family Medicine

## 2018-12-24 ENCOUNTER — Telehealth: Payer: Self-pay

## 2018-12-24 NOTE — Telephone Encounter (Signed)
Message for patient-I want you to call patient as I strongly suspect the next step needs to be an office visit instead of continued discussion by my chart-my chart is ideal for shorter/simple questions-complex decision making it is safer during visits/discussion    "I can certainly understand her hesitation-I would be happy to do a virtual visit to discuss benefits/risks with you.  Elijah Birk would be in addition to your other 3 medications.    This is about asthma and nebivolol  "https://www.sciencedirect.com/science/article/abs/pii/S0091674904004762#:~:text=Asthmatic%20subjects%20are%20the%20special,is%20potentially%20safe%20in%20asthma."    This is a cardioselective beta-blocker.  Blood pressure target is less than 140/90 and you are averaging above that-thus the reason to consider increasing medication.    Please clarify-I thought you were on 100 mg of losartan-if you are not we could increase that to 100 mg first to see if we can get you to goal. I also thought you were on chlorthalidone and not hydrochlorothiazide.  Once again-strongly believe we need a visit to discuss further, Garret Reddish"

## 2018-12-24 NOTE — Telephone Encounter (Signed)
Called pt and left VM to call the office.  

## 2018-12-25 NOTE — Telephone Encounter (Signed)
Spoke to pt and moved appt. For sooner. No further action at this time.

## 2018-12-25 NOTE — Telephone Encounter (Signed)
Pt returned vm from [place]. FC line rang busy x3. Pt scheduled OV for 01/06/19.

## 2018-12-31 ENCOUNTER — Encounter: Payer: Self-pay | Admitting: Family Medicine

## 2018-12-31 ENCOUNTER — Ambulatory Visit (INDEPENDENT_AMBULATORY_CARE_PROVIDER_SITE_OTHER): Payer: Medicare Other | Admitting: Family Medicine

## 2018-12-31 ENCOUNTER — Other Ambulatory Visit: Payer: Self-pay

## 2018-12-31 VITALS — BP 144/72 | HR 86 | Temp 98.4°F | Ht 61.0 in | Wt 151.2 lb

## 2018-12-31 DIAGNOSIS — E785 Hyperlipidemia, unspecified: Secondary | ICD-10-CM

## 2018-12-31 DIAGNOSIS — I10 Essential (primary) hypertension: Secondary | ICD-10-CM

## 2018-12-31 DIAGNOSIS — Z23 Encounter for immunization: Secondary | ICD-10-CM

## 2018-12-31 DIAGNOSIS — E663 Overweight: Secondary | ICD-10-CM | POA: Diagnosis not present

## 2018-12-31 LAB — POC URINALSYSI DIPSTICK (AUTOMATED)
Bilirubin, UA: NEGATIVE
Blood, UA: NEGATIVE
Glucose, UA: NEGATIVE
Ketones, UA: POSITIVE
Leukocytes, UA: NEGATIVE
Nitrite, UA: NEGATIVE
Protein, UA: NEGATIVE
Spec Grav, UA: 1.015 (ref 1.010–1.025)
Urobilinogen, UA: 0.2 E.U./dL
pH, UA: 6 (ref 5.0–8.0)

## 2018-12-31 MED ORDER — AMLODIPINE BESYLATE 10 MG PO TABS: 10.0000 mg | ORAL_TABLET | Freq: Every day | ORAL | 3 refills | Status: DC

## 2018-12-31 NOTE — Progress Notes (Signed)
Phone [phone]   Subjective:  Rhonda Spence is a 68 y.o. year old very pleasant female patient who presents for/with See problem oriented charting Chief Complaint  Patient presents with  . Follow-up  . Hypertension    See Mychart message for home BP readings.   . Asthma  . Insulin Resistance   ROS- no fever/chills/nausea/vomiting reported. Home BP high   Past Medical History-  Patient Active Problem List   Diagnosis Date Noted  . Migraine with aura 01/11/2018    Priority: Medium  . Insulin resistance 01/11/2018    Priority: Medium  . Glaucoma 02/02/2016    Priority: Medium  . Asthma, cough variant 02/02/2016    Priority: Medium  . Obstructive sleep apnea of adult 02/02/2016    Priority: Medium  . Hyperlipidemia, unspecified 02/12/2006    Priority: Medium  . Essential hypertension 02/12/2006    Priority: Medium  . History of adenomatous polyp of colon 01/11/2018    Priority: Low  . Seasonal and perennial allergic rhinitis 04/25/2016    Priority: Low  . NEPHROLITHIASIS, HX OF 02/12/2006    Priority: Low  . Vitamin D deficiency 06/08/2018  . Brittle nails 02/08/2018  . Hair loss 05/08/2017  . Hypertrophy of nasal turbinates 02/01/2017  . Allergic rhinitis 09/28/2016  . Bilateral temporomandibular joint pain 09/28/2016  . Ear pain, left 09/28/2016  . Primary open angle glaucoma of both eyes, mild stage 01/12/2015  . Age-related nuclear cataract of both eyes 01/12/2015  . Glaucoma suspect of both eyes 05/20/2013    Medications- reviewed and updated Current Outpatient Medications  Medication Sig Dispense Refill  . amLODipine (NORVASC) 5 MG tablet Take 5 mg by mouth daily. Taking half of 10 mg tablet as of 12/31/2018 visit    . azelastine (ASTELIN) 0.1 % nasal spray USE 1-2 SPRAYS IN EACH NARE AT BEDTIME 90 mL 3  . b complex vitamins tablet Take 1 tablet by mouth daily.    Marland Kitchen BIOTIN PO Take by mouth.    . calcium carbonate (CALCIUM 600) 600 MG TABS tablet  Take 600 mg by mouth daily with breakfast.    . chlorthalidone (HYGROTON) 25 MG tablet Take 1 tablet (25 mg total) by mouth daily. 90 tablet 3  . cholecalciferol (VITAMIN D) 1000 units tablet 5,000 Units. Take 3400 units every AM and 2000 units every PM    . CINNAMON PO Take 1,000 mg by mouth 2 (two) times daily.    . Coenzyme Q10 100 MG capsule Take by mouth.    . famotidine (PEPCID) 20 MG tablet Take 20 mg by mouth daily.    Marland Kitchen latanoprost (XALATAN) 0.005 % ophthalmic solution Place 1 drop into both eyes at bedtime.    Marland Kitchen losartan (COZAAR) 100 MG tablet Take 1 tablet (100 mg total) by mouth daily. 90 tablet 3  . medium chain triglycerides (MCT OIL) oil Take by mouth 3 (three) times daily. Takes 3 T qd    . montelukast (SINGULAIR) 10 MG tablet Take 1 tablet (10 mg total) by mouth daily with breakfast. 90 tablet 3  . Omega-3 Fatty Acids (FISH OIL PO) Take 1 capsule by mouth 2 (two) times daily.    Marland Kitchen oxymetazoline (AFRIN) 0.05 % nasal spray Place 2 sprays into both nostrils at bedtime. Per Dr. Erik Obey    . potassium citrate (UROCIT-K) 10 MEQ (1080 MG) SR tablet TAKE 1 TABLETS BID 180 each 3  . TURMERIC PO Take 1 tablet by mouth daily.    . VENTOLIN  HFA 108 (90 Base) MCG/ACT inhaler Inhale 2 puffs into the lungs every 4 (four) hours as needed for wheezing or shortness of breath. 18 g 3      Objective:  BP (!) 144/72 (BP Location: Left Arm, Patient Position: Sitting, Cuff Size: Normal)   Pulse 86   Temp 98.4 F (36.9 C) (Oral)   Ht 5\' 1"  (1.549 m)   Wt 151 lb 3.2 oz (68.6 kg)   SpO2 98%   BMI 28.57 kg/m  Gen: NAD, resting comfortably  CV: RRR  Lungs: nonlabored, normal respiratory rate Abdomen: soft/nondistended  Ext: no edema Skin: warm, dry     Assessment and Plan   #spiritis low lately with covid- will check in next visit  #hypertension/overweight S: Poorly controlled on Chlorthalidone 25 mg daily, Losartan 100 mg daily, and Amlodipine 5 mg daily.  Mistakenly at beginning of  visit thought patient was on 10 mg dose but review of my chart messages in discussion with patient showed she was only on 5 mg.  She has not started nebivolol yet.  Lifestyle 1.More sedentary lately 2.  Largely plant-based diet-some intake of animal protein-Bacon 2 slices in am, dinner small meat portion 3.  Down 16 pounds over the last year!  Trying to eat healthy in general  Patient observations 1.Felt like BP went up after losartan up from 50 to 100 mg-in general blood pressure seems to be increasing this year for unclear i reasons 2.  Felt like BP Higher since on amlodipine  3.  Potassium citrate is for kidney stones- patient wanted to remind me of this when we were discussing possible potassium sparing diuretic like spironolactone BP Readings from Last 3 Encounters:  12/31/18 (!) 144/72 our cuff. Home cuff 140/80 right afterwards  09/23/18 (!) 142/80  08/13/18 (!) 144/81  A/P: Home cuff seems reasonably accurate and home numbers have been high usually averaging over [id number] systolic though diastolics have been controlled-see my chart message today.  Interesting pattern more blood pressure seems to be increasing on losartan increase as well as amlodipine increase.  For step we will try maximizing amlodipine up to 10 mg- if fails to improve we discussed possibly pulling this back off/stopping it-could also try reducing losartan potentially.  Could try nebivolol as an alternate therapy. -Update labs today including TSH, urine, CMP to make sure no obvious cause of increase in blood pressure - Doubt pheochromocytoma - Discussed that get to 4 medications and not controlled would likely evaluate for renal artery stenosis  #hyperlipidemia S: Previously poorly controlled on OTC CoQ10 and Omega 3 supplements.  Lab Results  Component Value Date   CHOL 270 (A) 11/06/2017   HDL 51 11/06/2017   LDLCALC 205 11/06/2017   LDLDIRECT 177.4 06/25/2009   TRIG 69 11/06/2017   CHOLHDL 4 05/07/2013   A/P:  Down 16 pounds over the last year- update lipid panel today  # Insulin Resistance-last A1c over a year ago was 5.6 and patient has lost 16 pounds since that time- did not opt to repeat A1c at this time  Recommended follow up: 3-week update  Lab/Order associations:   ICD-10-CM   1. Essential hypertension  I10 CBC    Comprehensive metabolic panel    Lipid panel    POCT Urinalysis Dipstick (Automated)    TSH  2. Overweight  E66.3   3. Hyperlipidemia, unspecified hyperlipidemia type  E78.5 CBC    Comprehensive metabolic panel    Lipid panel    POCT Urinalysis Dipstick (Automated)  TSH  4. Need for immunization against influenza  Z23 Flu Vaccine QUAD High Dose(Fluad)    Meds ordered this encounter  Medications  . amLODipine (NORVASC) 10 MG tablet    Sig: Take 1 tablet (10 mg total) by mouth daily.    Dispense:  90 tablet    Refill:  3    Dose change.   Return precautions advised.  Garret Reddish, MD

## 2018-12-31 NOTE — Assessment & Plan Note (Signed)
S: Poorly controlled on Chlorthalidone 25 mg daily, Losartan 100 mg daily, and Amlodipine 5 mg daily.  Mistakenly at beginning of visit thought patient was on 10 mg dose but review of my chart messages in discussion with patient showed she was only on 5 mg.  She has not started nebivolol yet.  Lifestyle 1.More sedentary lately 2.  Largely plant-based diet-some intake of animal protein-Bacon 2 slices in am, dinner small meat portion 3.  Down 16 pounds over the last year!  Trying to eat healthy in general  Patient observations 1.Felt like BP went up after losartan up from 50 to 100 mg  2.  Felt like BP Higher since on amlodipine  3.  Potassium citrate is for kidney stones- patient wanted to remind me of this when we were discussing possible potassium sparing diuretic like spironolactone BP Readings from Last 3 Encounters:  12/31/18 (!) 144/72 our cuff. Home cuff 140/80 right afterwards  09/23/18 (!) 142/80  08/13/18 (!) 144/81  A/P: Home cuff seems reasonably accurate and home numbers have been high usually averaging over [id number] systolic though diastolics have been controlled-see my chart message today.  Interesting pattern more blood pressure seems to be increasing on losartan increase as well as amlodipine increase.  For step we will try maximizing amlodipine up to 10 mg- if fails to improve we discussed possibly pulling this back off/stopping it-could also try reducing losartan potentially.  Could try nebivolol as an alternate therapy. -Update labs today including TSH, urine, CMP to make sure no obvious cause of increase in blood pressure - Doubt pheochromocytoma - Discussed that get to 4 medications and not controlled would likely evaluate for renal artery stenosis

## 2018-12-31 NOTE — Patient Instructions (Addendum)
Health Maintenance Due  Topic Date Due  . INFLUENZA VACCINE - high dose flu shot today 12/07/2018   Continue losartan 100mg  Continue chlorthalidone 25mg  Increase amlodipine from 5 to 10mg   Update me in 3 weeks either by mychart or we can do another in person  If blood pressure goes up, we will consider discontinue amlodipine and trial of nebivolol instead  Hold off on nebivolol for now   Please stop by lab before you go If you do not have mychart- we will call you about results within 5 business days of [place] receiving them.  If you have mychart- we will send your results within 3 business days of [place] receiving them.  If abnormal or we want to clarify a result, we will call or mychart you to make sure you receive the message.  If you have questions or concerns or don't hear within 5-7 days, please send [place] a message or call us.

## 2019-01-01 LAB — CBC
HCT: 41 % (ref 36.0–46.0)
Hemoglobin: 14.1 g/dL (ref 12.0–15.0)
MCHC: 34.5 g/dL (ref 30.0–36.0)
MCV: 90.4 fl (ref 78.0–100.0)
Platelets: 255 10*3/uL (ref 150.0–400.0)
RBC: 4.54 Mil/uL (ref 3.87–5.11)
RDW: 13.1 % (ref 11.5–15.5)
WBC: 7.6 10*3/uL (ref 4.0–10.5)

## 2019-01-01 LAB — COMPREHENSIVE METABOLIC PANEL
ALT: 44 U/L — ABNORMAL HIGH (ref 0–35)
AST: 25 U/L (ref 0–37)
Albumin: 4.6 g/dL (ref 3.5–5.2)
Alkaline Phosphatase: 58 U/L (ref 39–117)
BUN: 18 mg/dL (ref 6–23)
CO2: 30 mEq/L (ref 19–32)
Calcium: 10.1 mg/dL (ref 8.4–10.5)
Chloride: 98 mEq/L (ref 96–112)
Creatinine, Ser: 0.77 mg/dL (ref 0.40–1.20)
GFR: 74.17 mL/min (ref >60.00)
Glucose, Bld: 106 mg/dL — ABNORMAL HIGH (ref 70–99)
Potassium: 2.9 mEq/L — ABNORMAL LOW (ref 3.5–5.1)
Sodium: 139 mEq/L (ref 135–145)
Total Bilirubin: 0.4 mg/dL (ref 0.2–1.2)
Total Protein: 7.7 g/dL (ref 6.0–8.3)

## 2019-01-01 LAB — LIPID PANEL
Cholesterol: 282 mg/dL — ABNORMAL HIGH (ref 0–200)
HDL: 54.5 mg/dL (ref >39.00)
LDL Cholesterol: 206 mg/dL — ABNORMAL HIGH (ref 0–99)
NonHDL: 227.48
Total CHOL/HDL Ratio: 5
Triglycerides: 108 mg/dL (ref 0.0–149.0)
VLDL: 21.6 mg/dL (ref 0.0–40.0)

## 2019-01-01 LAB — TSH: TSH: 1.19 u[IU]/mL (ref 0.35–4.50)

## 2019-01-01 MED ORDER — POTASSIUM CHLORIDE CRYS ER 20 MEQ PO TBCR: 20.0000 meq | EXTENDED_RELEASE_TABLET | Freq: Every day | ORAL | 0 refills | Status: DC | PRN

## 2019-01-01 NOTE — Addendum Note (Signed)
Addended by: Marin Olp on: 01/01/2019 02:09 PM   Modules accepted: Orders

## 2019-01-02 ENCOUNTER — Encounter: Payer: Self-pay | Admitting: Family Medicine

## 2019-01-03 MED ORDER — ROSUVASTATIN CALCIUM 40 MG PO TABS: 40.0000 mg | ORAL_TABLET | ORAL | 3 refills | Status: DC

## 2019-01-06 ENCOUNTER — Ambulatory Visit: Payer: Medicare Other | Admitting: Family Medicine

## 2019-01-21 ENCOUNTER — Encounter: Payer: Self-pay | Admitting: Family Medicine

## 2019-01-21 DIAGNOSIS — S93401A Sprain of unspecified ligament of right ankle, initial encounter: Secondary | ICD-10-CM | POA: Diagnosis not present

## 2019-01-21 DIAGNOSIS — M7731 Calcaneal spur, right foot: Secondary | ICD-10-CM | POA: Diagnosis not present

## 2019-02-04 DIAGNOSIS — S93401D Sprain of unspecified ligament of right ankle, subsequent encounter: Secondary | ICD-10-CM | POA: Diagnosis not present

## 2019-02-04 DIAGNOSIS — M7731 Calcaneal spur, right foot: Secondary | ICD-10-CM | POA: Diagnosis not present

## 2019-02-12 ENCOUNTER — Encounter: Payer: Self-pay | Admitting: Family Medicine

## 2019-02-12 NOTE — Telephone Encounter (Signed)
Called pt and made her aware of below and scheduled pt for 02/28/2019. Pt did not need refill at this time.

## 2019-02-14 ENCOUNTER — Encounter

## 2019-02-28 ENCOUNTER — Ambulatory Visit (INDEPENDENT_AMBULATORY_CARE_PROVIDER_SITE_OTHER): Payer: Medicare Other

## 2019-02-28 ENCOUNTER — Encounter: Payer: Self-pay | Admitting: Family Medicine

## 2019-02-28 ENCOUNTER — Ambulatory Visit (INDEPENDENT_AMBULATORY_CARE_PROVIDER_SITE_OTHER): Payer: Medicare Other | Admitting: Family Medicine

## 2019-02-28 ENCOUNTER — Other Ambulatory Visit: Payer: Self-pay

## 2019-02-28 VITALS — BP 126/74 | HR 86 | Temp 98.3°F | Ht 61.0 in | Wt 149.0 lb

## 2019-02-28 VITALS — BP 126/74 | Temp 98.3°F | Ht 61.0 in | Wt 149.4 lb

## 2019-02-28 DIAGNOSIS — M25562 Pain in left knee: Secondary | ICD-10-CM | POA: Diagnosis not present

## 2019-02-28 DIAGNOSIS — M79645 Pain in left finger(s): Secondary | ICD-10-CM | POA: Diagnosis not present

## 2019-02-28 DIAGNOSIS — I10 Essential (primary) hypertension: Secondary | ICD-10-CM

## 2019-02-28 DIAGNOSIS — E8881 Metabolic syndrome: Secondary | ICD-10-CM | POA: Diagnosis not present

## 2019-02-28 DIAGNOSIS — Z0000 Encounter for general adult medical examination without abnormal findings: Secondary | ICD-10-CM | POA: Diagnosis not present

## 2019-02-28 MED ORDER — NEBIVOLOL HCL 2.5 MG PO TABS: 2.5000 mg | ORAL_TABLET | Freq: Every day | ORAL | 5 refills | Status: DC

## 2019-02-28 NOTE — Progress Notes (Signed)
Phone [phone]   Subjective:  Rhonda Spence is a 68 y.o. year old very pleasant female patient who presents for/with See problem oriented charting Chief Complaint  Patient presents with  . Follow-up  . Hypertension   ROS-some knee and thumb pain.  No fever/chills/vomiting.  Past Medical History-  Patient Active Problem List   Diagnosis Date Noted  . Vitamin D deficiency 06/08/2018    Priority: Medium  . Migraine with aura 01/11/2018    Priority: Medium  . Insulin resistance 01/11/2018    Priority: Medium  . Glaucoma 02/02/2016    Priority: Medium  . Asthma, cough variant 02/02/2016    Priority: Medium  . Obstructive sleep apnea of adult 02/02/2016    Priority: Medium  . Hyperlipidemia, unspecified 02/12/2006    Priority: Medium  . Essential hypertension 02/12/2006    Priority: Medium  . Brittle nails 02/08/2018    Priority: Low  . History of adenomatous polyp of colon 01/11/2018    Priority: Low  . Hypertrophy of nasal turbinates 02/01/2017    Priority: Low  . Allergic rhinitis 09/28/2016    Priority: Low  . Bilateral temporomandibular joint pain 09/28/2016    Priority: Low  . Seasonal and perennial allergic rhinitis 04/25/2016    Priority: Low  . Age-related nuclear cataract of both eyes 01/12/2015    Priority: Low  . NEPHROLITHIASIS, HX OF 02/12/2006    Priority: Low    Medications- reviewed and updated Current Outpatient Medications  Medication Sig Dispense Refill  . amLODipine (NORVASC) 10 MG tablet Take 1 tablet (10 mg total) by mouth daily. 90 tablet 3  . azelastine (ASTELIN) 0.1 % nasal spray USE 1-2 SPRAYS IN EACH NARE AT BEDTIME 90 mL 3  . b complex vitamins tablet Take 1 tablet by mouth daily.    Marland Kitchen BIOTIN PO Take by mouth.    . calcium carbonate (CALCIUM 600) 600 MG TABS tablet Take 600 mg by mouth daily with breakfast.    . chlorthalidone (HYGROTON) 25 MG tablet Take 1 tablet (25 mg total) by mouth daily. 90 tablet 3  . cholecalciferol  (VITAMIN D) 1000 units tablet 5,000 Units. Take 3400 units every AM and 2000 units every PM    . CINNAMON PO Take 1,000 mg by mouth 2 (two) times daily.    . Coenzyme Q10 100 MG capsule Take by mouth.    . famotidine (PEPCID) 20 MG tablet Take 20 mg by mouth daily.    Marland Kitchen latanoprost (XALATAN) 0.005 % ophthalmic solution Place 1 drop into both eyes at bedtime.    Marland Kitchen losartan (COZAAR) 100 MG tablet Take 1 tablet (100 mg total) by mouth daily. 90 tablet 3  . medium chain triglycerides (MCT OIL) oil Take by mouth 3 (three) times daily. Takes 3 T qd    . montelukast (SINGULAIR) 10 MG tablet Take 1 tablet (10 mg total) by mouth daily with breakfast. 90 tablet 3  . Omega-3 Fatty Acids (FISH OIL PO) Take 1 capsule by mouth 2 (two) times daily.    Marland Kitchen oxymetazoline (AFRIN) 0.05 % nasal spray Place 2 sprays into both nostrils at bedtime. Per Dr. Erik Obey    . potassium citrate (UROCIT-K) 10 MEQ (1080 MG) SR tablet TAKE 1 TABLETS BID 180 each 3  . rosuvastatin (CRESTOR) 40 MG tablet Take 1 tablet (40 mg total) by mouth once a week. 13 tablet 3  . TURMERIC PO Take 1 tablet by mouth daily.    . VENTOLIN HFA 108 (90 Base)  MCG/ACT inhaler Inhale 2 puffs into the lungs every 4 (four) hours as needed for wheezing or shortness of breath. 18 g 3  . nebivolol (BYSTOLIC) 2.5 MG tablet Take 1 tablet (2.5 mg total) by mouth daily. 30 tablet 5   No current facility-administered medications for this visit.      Objective:  BP 126/74 (BP Location: Left Arm, Patient Position: Sitting, Cuff Size: Normal)   Pulse 86   Temp 98.3 F (36.8 C)   Ht 5\' 1"  (1.549 m)   Wt 149 lb (67.6 kg)   SpO2 97%   BMI 28.15 kg/m  Gen: NAD, resting comfortably CV: RRR no murmurs rubs or gallops Lungs: CTAB no crackles, wheeze, rhonchi Abdomen: soft/nontender/nondistended/normal bowel sounds.Ext: no edema Skin: warm, dry    Assessment and Plan   #hypertension S: controlled in office on amlodipine 10 mg, chlorthalidone 25 mg,  losartan 100 mg.  Average home readings over the last 2 weeks 148/82 which actually increased slightly on systolic member when going from losartan 50 mg.  She does report some Orthostatic intolerance even with hydration  Mild nausea an hour after eating- only possible side effect otherwise.  BP Readings from Last 3 Encounters:  02/28/19 126/74  02/28/19 126/74  12/31/18 (!) 144/72  A/P: Good control in the office but at home control is still not ideal.  We have previously discussed possibly adding spironolactone but she is concerned about increasing potassium-she have to be on potassium citrate to reduce risk of kidney stones. -We have previously verified her home cuff -She would consider alternative therapies if possible -discussed possibly beet root juice but this may make kidney stones more likely. -She would like to try 3 bags of hibiscus tea in 8 ounces of water each morning to see if that makes a difference -She will update me in 2 weeks and if not improving will start nebivolol 2.5 mg -Had hypokalemia last visit fortunately not present today-consider repeat every 3 to 6 months  # left knee pain/left thumb pain S:has had a lot of discomfort in left knee.   Also with some left thumb pain- seems to be worse with holding phone or kindle.   A/P: Left knee pain may be related arthritis.  Could have similar effect on left thumb-also could be positional is worsened by holding phone or other device.  She asks about seeing a physician within [facility] Dr. Delilah Shan versus Dr. Gramig-recommended Dr. Delilah Shan due to [facility] of his abilities and he can certainly refer to Dr. Amedeo Plenty if needed but I doubt her thumb is going to be a surgical issue  I recommended while she was waiting for a visit to try Voltaren gel for age-may be beneficial and will be low risk  #Right ankle pain-Compression sleeve recommended by podiatry for right ankle per patient-encouraged her to start this  #Mild ALT  elevation-continue to work on healthy eating/regular exercise-suspect related to possible fatty liver-could perform ultrasound if levels worsen.  Recommended follow up: We discussed 2 in 4-week MyChart update-likely would recommend 32-month in-person follow-up  Lab/Order associations:   ICD-10-CM   1. Essential hypertension  I10 Comprehensive metabolic panel    Comprehensive metabolic panel    CANCELED: Comprehensive metabolic panel  2. Insulin resistance  E88.81   3. Left knee pain, unspecified chronicity  M25.562   4. Pain of left thumb  M79.645     Meds ordered this encounter  Medications  . nebivolol (BYSTOLIC) 2.5 MG tablet    Sig: Take 1  tablet (2.5 mg total) by mouth daily.    Dispense:  30 tablet    Refill:  5   Time Stamp The duration of face-to-face time during this visit was greater than 25 minutes. Greater than 50% of this time was spent in counseling, explanation of diagnosis, planning of further management, and/or coordination of care including reviewing possible neck steps for blood pressure management, discussing potential supplements, discussing diagnosis of insulin resistance.   Return precautions advised.  Garret Reddish, MD

## 2019-02-28 NOTE — Patient Instructions (Addendum)
[name] , Thank you for taking time to come for your Medicare Wellness Visit. I appreciate your ongoing commitment to your health goals. Please review the following plan we discussed and let me know if I can assist you in the future.   Screening recommendations/referrals: Colorectal Screening: up to date; last 04/18/18 with Dr. Fuller Plan Mammogram: up to date;last 11/06/18 Bone Density: up to date; last 08/30/16   Vision and Dental Exams: Recommended annual ophthalmology exams for early detection of glaucoma and other disorders of the eye Recommended annual dental exams for proper oral hygiene  Vaccinations: Influenza vaccine: completed 12/31/18 Pneumococcal vaccine: up to date; last 01/11/18 Tdap vaccine: up to date; last 09/06/17 Shingles vaccine: Shingrix completed   Advanced directives: Please bring a copy of your POA (Power of Reeder) and/or Living Will to your next appointment.  Goals: Recommend to drink at least 6-8 8oz glasses of water per day and consume a balanced diet rich in fresh fruits and vegetables.   Next appointment: Please schedule your Annual Wellness Visit with your Nurse Health Advisor in one year.  Preventive Care 70 Years and Older, Female Preventive care refers to lifestyle choices and visits with your health care provider that can promote health and wellness. What does preventive care include?  A yearly physical exam. This is also called an annual well check.  Dental exams once or twice a year.  Routine eye exams. Ask your health care provider how often you should have your eyes checked.  Personal lifestyle choices, including:  Daily care of your teeth and gums.  Regular physical activity.  Eating a healthy diet.  Avoiding tobacco and drug use.  Limiting alcohol use.  Practicing safe sex.  Taking low-dose aspirin every day if recommended by your health care provider.  Taking vitamin and mineral supplements as recommended by your health care  provider. What happens during an annual well check? The services and screenings done by your health care provider during your annual well check will depend on your age, overall health, lifestyle risk factors, and family history of disease. Counseling  Your health care provider may ask you questions about your:  Alcohol use.  Tobacco use.  Drug use.  Emotional well-being.  Home and relationship well-being.  Sexual activity.  Eating habits.  History of falls.  Memory and ability to understand (cognition).  Work and work Statistician.  Reproductive health. Screening  You may have the following tests or measurements:  Height, weight, and BMI.  Blood pressure.  Lipid and cholesterol levels. These may be checked every 5 years, or more frequently if you are over 26 years old.  Skin check.  Lung cancer screening. You may have this screening every year starting at age 31 if you have a 30-pack-year history of smoking and currently smoke or have quit within the past 15 years.  Fecal occult blood test (FOBT) of the stool. You may have this test every year starting at age 53.  Flexible sigmoidoscopy or colonoscopy. You may have a sigmoidoscopy every 5 years or a colonoscopy every 10 years starting at age 29.  Hepatitis C blood test.  Hepatitis B blood test.  Sexually transmitted disease (STD) testing.  Diabetes screening. This is done by checking your blood sugar (glucose) after you have not eaten for a while (fasting). You may have this done every 1-3 years.  Bone density scan. This is done to screen for osteoporosis. You may have this done starting at age 18.  Mammogram. This may be  done every 1-2 years. Talk to your health care provider about how often you should have regular mammograms. Talk with your health care provider about your test results, treatment options, and if necessary, the need for more tests. Vaccines  Your health care provider may recommend certain  vaccines, such as:  Influenza vaccine. This is recommended every year.  Tetanus, diphtheria, and acellular pertussis (Tdap, Td) vaccine. You may need a Td booster every 10 years.  Zoster vaccine. You may need this after age 42.  Pneumococcal 13-valent conjugate (PCV13) vaccine. One dose is recommended after age 70.  Pneumococcal polysaccharide (PPSV23) vaccine. One dose is recommended after age 70. Talk to your health care provider about which screenings and vaccines you need and how often you need them. This information is not intended to replace advice given to you by your health care provider. Make sure you discuss any questions you have with your health care provider. Document Released: 05/21/2015 Document Revised: 01/12/2016 Document Reviewed: 02/23/2015 Elsevier Interactive Patient Education  2017 [place] Prevention in the Home Falls can cause injuries. They can happen to people of all ages. There are many things you can do to make your home safe and to help prevent falls. What can I do on the outside of my home?  Regularly fix the edges of walkways and driveways and fix any cracks.  Remove anything that might make you trip as you walk through a door, such as a raised step or threshold.  Trim any bushes or trees on the path to your home.  Use bright outdoor lighting.  Clear any walking paths of anything that might make someone trip, such as rocks or tools.  Regularly check to see if handrails are loose or broken. Make sure that both sides of any steps have handrails.  Any raised decks and porches should have guardrails on the edges.  Have any leaves, snow, or ice cleared regularly.  Use sand or salt on walking paths during winter.  Clean up any spills in your garage right away. This includes oil or grease spills. What can I do in the bathroom?  Use night lights.  Install grab bars by the toilet and in the tub and shower. Do not use towel bars as grab  bars.  Use non-skid mats or decals in the tub or shower.  If you need to sit down in the shower, use a plastic, non-slip stool.  Keep the floor dry. Clean up any water that spills on the floor as soon as it happens.  Remove soap buildup in the tub or shower regularly.  Attach bath mats securely with double-sided non-slip rug tape.  Do not have throw rugs and other things on the floor that can make you trip. What can I do in the bedroom?  Use night lights.  Make sure that you have a light by your bed that is easy to reach.  Do not use any sheets or blankets that are too big for your bed. They should not hang down onto the floor.  Have a firm chair that has side arms. You can use this for support while you get dressed.  Do not have throw rugs and other things on the floor that can make you trip. What can I do in the kitchen?  Clean up any spills right away.  Avoid walking on wet floors.  Keep items that you use a lot in easy-to-reach places.  If you need to reach something above you, use  a strong step stool that has a grab bar.  Keep electrical cords out of the way.  Do not use floor polish or wax that makes floors slippery. If you must use wax, use non-skid floor wax.  Do not have throw rugs and other things on the floor that can make you trip. What can I do with my stairs?  Do not leave any items on the stairs.  Make sure that there are handrails on both sides of the stairs and use them. Fix handrails that are broken or loose. Make sure that handrails are as long as the stairways.  Check any carpeting to make sure that it is firmly attached to the stairs. Fix any carpet that is loose or worn.  Avoid having throw rugs at the top or bottom of the stairs. If you do have throw rugs, attach them to the floor with carpet tape.  Make sure that you have a light switch at the top of the stairs and the bottom of the stairs. If you do not have them, ask someone to add them for  you. What else can I do to help prevent falls?  Wear shoes that:  Do not have high heels.  Have rubber bottoms.  Are comfortable and fit you well.  Are closed at the toe. Do not wear sandals.  If you use a stepladder:  Make sure that it is fully opened. Do not climb a closed stepladder.  Make sure that both sides of the stepladder are locked into place.  Ask someone to hold it for you, if possible.  Clearly mark and make sure that you can see:  Any grab bars or handrails.  First and last steps.  Where the edge of each step is.  Use tools that help you move around (mobility aids) if they are needed. These include:  Canes.  Walkers.  Scooters.  Crutches.  Turn on the lights when you go into a dark area. Replace any light bulbs as soon as they burn out.  Set up your furniture so you have a clear path. Avoid moving your furniture around.  If any of your floors are uneven, fix them.  If there are any pets around you, be aware of where they are.  Review your medicines with your doctor. Some medicines can make you feel dizzy. This can increase your chance of falling. Ask your doctor what other things that you can do to help prevent falls. This information is not intended to replace advice given to you by your health care provider. Make sure you discuss any questions you have with your health care provider. Document Released: 02/18/2009 Document Revised: 09/30/2015 Document Reviewed: 05/29/2014 Elsevier Interactive Patient Education  2017 [employer].

## 2019-02-28 NOTE — Progress Notes (Signed)
I have reviewed and agree with note, evaluation, plan.   Dalissa Lovin, MD  

## 2019-02-28 NOTE — Patient Instructions (Addendum)
Try hibiscus tea each morning- 3 bags in 8 oz of water.   Give me an update in 2 weeks. Make sure to rinse out mouth with water after drinking.   I sent in nebivolol 2.5 mg for you to try at 2 weeks if blood pressure not improving. If improves some- may use combo of hibiscus tea and this low dose.   Could try voltaren gel over the counter for thumb or knee. I like your idea of seeing Dr. Delilah Shan as well.

## 2019-02-28 NOTE — [facility] Notes (Signed)
Subjective:   Rhonda Spence is a 68 y.o. female who presents for Medicare Annual (Subsequent) preventive examination.  Review of Systems:   Cardiac Risk Factors include: hypertension;advanced age (>49mn, >>36women)     Objective:     Vitals: BP 126/74 (BP Location: Left Arm, Patient Position: Sitting, Cuff Size: Normal)   Temp 98.3 F (36.8 C) (Temporal)   Ht '5\' 1"'$  (1.549 m)   Wt 149 lb 6.4 oz (67.8 kg)   BMI 28.23 kg/m   Body mass index is 28.23 kg/m.  Advanced Directives 02/28/2019  Does Patient Have a Medical Advance Directive? Yes  Type of [place]  Does patient want to make changes to medical advance directive? Yes (MAU/Ambulatory/Procedural Areas - Information given)  Copy of [place]? No - copy requested    Tobacco Social History   Tobacco Use  Smoking Status Never Smoker  Smokeless Tobacco Never Used     Counseling given: Not Answered   Clinical Intake:  Pre-visit preparation completed: Yes  Pain : No/denies pain  Diabetes: No  How often do you need to have someone help you when you read instructions, pamphlets, or other written materials from your doctor or pharmacy?: 1 - Never  Interpreter Needed?: No  Information entered by :: CDenman GeorgeLPN  Past Medical History:  Diagnosis Date  . Allergy   . Anemia   . Asthma   . Cataract   . Chronic kidney disease    stones  . GERD (gastroesophageal reflux disease)   . Glaucoma   . Hyperlipidemia   . Hypertension   . Nephrolithiasis    has required lithotripsy  . Sleep apnea    wears an oral appliance   Past Surgical History:  Procedure Laterality Date  . ADENOIDECTOMY    . BREAST BIOPSY Left 07/09/2009   benign x2  . BREAST BIOPSY    . CESAREAN SECTION     x2  . COLONOSCOPY    . LITHOTRIPSY  2011   kimbrough  . POLYPECTOMY    . TONSILLECTOMY    . widom teeth extraction  1976   Family  History  Problem Relation Age of Onset  . Hypertension Mother   . Heart failure Mother   . Asthma Mother   . COPD Mother        led to death early 834s . Heart disease Father   . Heart failure Father        related to osteogenesis imperfecta. died at 658 . Osteogenesis imperfecta Father   . Osteogenesis imperfecta Sister        possibly related to this- led to death  . Breast cancer Sister        half sister  . Stroke Brother   . Healthy Brother   . Breast cancer Maternal Aunt   . Hyperlipidemia Son   . Obesity Maternal Grandmother   . Hyperlipidemia Paternal Grandmother   . Hypertension Paternal Grandmother   . Hyperlipidemia Son   . Glaucoma Son   . Colon cancer Neg Hx   . Colon polyps Neg Hx   . Esophageal cancer Neg Hx   . Stomach cancer Neg Hx   . Rectal cancer Neg Hx    Social History   Socioeconomic History  . Marital status: Married    Spouse name: Not on file  . Number of children: Not on file  . Years of education: Not  on file  . Highest education level: Not on file  Occupational History  . Not on file  Social Needs  . Financial resource strain: Not on file  . Food insecurity    Worry: Not on file    Inability: Not on file  . Transportation needs    Medical: Not on file    Non-medical: Not on file  Tobacco Use  . Smoking status: Never Smoker  . Smokeless tobacco: Never Used  Substance and Sexual Activity  . Alcohol use: Not Currently    Alcohol/week: 0.0 - 1.0 standard drinks  . Drug use: No  . Sexual activity: Yes  Lifestyle  . Physical activity    Days per week: Not on file    Minutes per session: Not on file  . Stress: Not on file  Relationships  . Social Herbalist on phone: Not on file    Gets together: Not on file    Attends religious service: Not on file    Active member of club or organization: Not on file    Attends meetings of clubs or organizations: Not on file    Relationship status: Not on file  Other Topics Concern   . Not on file  Social History Narrative   Married (husband Fritz Pickerel patient of Dr. Yong Channel ). Son 86 with CP in group home, son 50 lives in [place]. 2 grandkids in [place] 11 and 8 in 2019 (not expecting more).       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 Book club    Outpatient Encounter Medications as of 02/28/2019  Medication Sig  . amLODipine (NORVASC) 10 MG tablet Take 1 tablet (10 mg total) by mouth daily.  Marland Kitchen azelastine (ASTELIN) 0.1 % nasal spray USE 1-2 SPRAYS IN EACH NARE AT BEDTIME  . b complex vitamins tablet Take 1 tablet by mouth daily.  Marland Kitchen BIOTIN PO Take by mouth.  . calcium carbonate (CALCIUM 600) 600 MG TABS tablet Take 600 mg by mouth daily with breakfast.  . chlorthalidone (HYGROTON) 25 MG tablet Take 1 tablet (25 mg total) by mouth daily.  . cholecalciferol (VITAMIN D) 1000 units tablet 5,000 Units. Take 3400 units every AM and 2000 units every PM  . CINNAMON PO Take 1,000 mg by mouth 2 (two) times daily.  . Coenzyme Q10 100 MG capsule Take by mouth.  . famotidine (PEPCID) 20 MG tablet Take 20 mg by mouth daily.  Marland Kitchen latanoprost (XALATAN) 0.005 % ophthalmic solution Place 1 drop into both eyes at bedtime.  Marland Kitchen losartan (COZAAR) 100 MG tablet Take 1 tablet (100 mg total) by mouth daily.  . medium chain triglycerides (MCT OIL) oil Take by mouth 3 (three) times daily. Takes 3 T qd  . montelukast (SINGULAIR) 10 MG tablet Take 1 tablet (10 mg total) by mouth daily with breakfast.  . Omega-3 Fatty Acids (FISH OIL PO) Take 1 capsule by mouth 2 (two) times daily.  Marland Kitchen oxymetazoline (AFRIN) 0.05 % nasal spray Place 2 sprays into both nostrils at bedtime. Per Dr. Erik Obey  . potassium citrate (UROCIT-K) 10 MEQ (1080 MG) SR tablet TAKE 1 TABLETS BID  .  rosuvastatin (CRESTOR) 40 MG tablet Take 1 tablet (40 mg total) by mouth once a week.  . TURMERIC PO  Take 1 tablet by mouth daily.  . VENTOLIN HFA 108 (90 Base) MCG/ACT inhaler Inhale 2 puffs into the lungs every 4 (four) hours as needed for wheezing or shortness of breath.  . [DISCONTINUED] potassium chloride SA (K-DUR) 20 MEQ tablet Take 1 tablet (20 mEq total) by mouth daily as needed.   No facility-administered encounter medications on file as of 02/28/2019.     Activities of Daily Living In your present state of health, do you have any difficulty performing the following activities: 02/28/2019 08/12/2018  Hearing? N N  Vision? N N  Difficulty concentrating or making decisions? N N  Walking or climbing stairs? N N  Dressing or bathing? N N  Doing errands, shopping? N N  Preparing Food and eating ? N -  Using the Toilet? N -  In the past six months, have you accidently leaked urine? N -  Do you have problems with loss of bowel control? N -  Managing your Medications? N -  Managing your Finances? N -  Housekeeping or managing your Housekeeping? N -  Some recent data might be hidden    Patient Care Team: Marin Olp, MD as PCP - General (Family Medicine) Verneita Griffes, MD as Consulting Physician (Ophthalmology) Rosemary Holms, DPM as Consulting Physician (Podiatry)    Assessment:   This is a routine wellness examination for Rhonda Spence.  Exercise Activities and Dietary recommendations Current Exercise Habits: Home exercise routine, Type of exercise: walking, Time (Minutes): 35, Frequency (Times/Week): 3, Weekly Exercise (Minutes/Week): 105, Intensity: Mild  Goals    . Blood Pressure < 140/90       Fall Risk Fall Risk  02/28/2019 01/11/2018  Falls in the past year? 0 No  Injury with Fall? 0 -  Follow up Falls evaluation completed;Education provided;Falls prevention discussed -   Is the patient's home free of loose throw rugs in walkways, pet beds, electrical cords,  etc?   yes      Grab bars in the bathroom? yes      Handrails on the stairs?   yes      Adequate lighting?   yes  Timed Get Up and Go performed: completed and within normal timeframe; no gait abnormalities noted    Depression Screen PHQ 2/9 Scores 02/28/2019 01/11/2018  PHQ - 2 Score 0 1     Cognitive Function-no cognitive concerns at this time  Alert? Yes         Normal Appearance? Yes Oriented to person? Yes           Place? Yes  Time? Yes  Recall of three objects? Yes  Can perform simple calculations? Yes  Displays appropriate judgment? Yes  Can read the correct time from a watch face? Yes   Immunization History  Administered Date(s) Administered  . Fluad Quad(high Dose 65+) 12/31/2018  . Influenza Split 05/01/2011, 02/05/2017  . Influenza Whole 01/17/2008, 02/05/2010  . Influenza, High Dose Seasonal PF 01/11/2018  . Influenza,inj,Quad PF,6+ Mos 02/07/2013, 03/08/2016  . Influenza-Unspecified 03/03/2016  . MMR 09/06/2017  . Pneumococcal Conjugate-13 08/11/2014, 01/11/2018  . Pneumococcal Polysaccharide-23 09/01/2015  . Td 05/09/1999, 07/05/2009  . Tdap 09/06/2017  . Zoster 08/05/2010  . Zoster Recombinat (Shingrix) 01/17/2018, 04/25/2018    Qualifies for Shingles Vaccine? Shingrix completed   Screening Tests Health Maintenance  Topic Date Due  . MAMMOGRAM  11/05/2020  . COLONOSCOPY  04/19/2023  . TETANUS/TDAP  09/07/2027  . INFLUENZA VACCINE  Completed  . DEXA SCAN  Completed  .  Hepatitis C Screening  Completed  . PNA vac Low Risk Adult  Completed    Cancer Screenings: Lung: Low Dose CT Chest recommended if Age 4-80 years, 30 pack-year currently smoking OR have quit w/in 15years. Patient does not qualify. Breast:  Up to date on Mammogram? Yes   Up to date of Bone Density/Dexa? Yes Colorectal: colonoscopy 04/18/18 with Dr. Fuller Plan     Plan:  I have personally reviewed and addressed the Medicare Annual Wellness questionnaire and have noted the following in  the patient's chart:  A. Medical and social history B. Use of alcohol, tobacco or illicit drugs  C. Current medications and supplements D. Functional ability and status E.  Nutritional status F.  Physical activity G. Advance directives H. List of other physicians I.  Hospitalizations, surgeries, and ER visits in previous 12 months J.  [place] such as hearing and vision if needed, cognitive and depression L. Referrals, records requested, and appointments- none   In addition, I have reviewed and discussed with patient certain preventive protocols, quality metrics, and best practice recommendations. A written personalized care plan for preventive services as well as general preventive health recommendations were provided to patient.   Signed,  Denman George, LPN  Nurse Health Advisor   Nurse Notes: no additional

## 2019-03-01 LAB — COMPREHENSIVE METABOLIC PANEL
AG Ratio: 1.5 (calc) (ref 1.0–2.5)
ALT: 38 U/L — ABNORMAL HIGH (ref 6–29)
AST: 24 U/L (ref 10–35)
Albumin: 4.7 g/dL (ref 3.6–5.1)
Alkaline phosphatase (APISO): 67 U/L (ref 37–153)
BUN: 21 mg/dL (ref 7–25)
CO2: 29 mmol/L (ref 20–32)
Calcium: 10.7 mg/dL — ABNORMAL HIGH (ref 8.6–10.4)
Chloride: 98 mmol/L (ref 98–110)
Creat: 0.77 mg/dL (ref 0.50–0.99)
Globulin: 3.1 g/dL (calc) (ref 1.9–3.7)
Glucose, Bld: 90 mg/dL (ref 65–99)
Potassium: 3.8 mmol/L (ref 3.5–5.3)
Sodium: 140 mmol/L (ref 135–146)
Total Bilirubin: 0.4 mg/dL (ref 0.2–1.2)
Total Protein: 7.8 g/dL (ref 6.1–8.1)

## 2019-03-01 NOTE — Assessment & Plan Note (Signed)
S:Patient does have family history of diabetes.  Likely with her recent lifestyle changes her A1c has trended down from 6.3 then to 5.7 then to 5.6 most recently A/P: We discussed that she has gotten herself out of prediabetes range-we opted not to resolve issue on the problem list as a reminder to continue to check A1c-if numbers remain below 5.7 may consider changing diagnosis to family history to continue to prompt her monitor to check at least yearly

## 2019-03-01 NOTE — Assessment & Plan Note (Signed)
S: controlled in office on amlodipine 10 mg, chlorthalidone 25 mg, losartan 100 mg.  Average home readings over the last 2 weeks 148/82 which actually increased slightly on systolic member when going from losartan 50 mg.  She does report some Orthostatic intolerance even with hydration  Mild nausea an hour after eating- only possible side effect otherwise.  BP Readings from Last 3 Encounters:  02/28/19 126/74  02/28/19 126/74  12/31/18 (!) 144/72  A/P: Good control in the office but at home control is still not ideal.  We have previously discussed possibly adding spironolactone but she is concerned about increasing potassium-she have to be on potassium citrate to reduce risk of kidney stones. -We have previously verified her home cuff -She would consider alternative therapies if possible -discussed possibly beet root juice but this may make kidney stones more likely. -She would like to try 3 bags of hibiscus tea in 8 ounces of water each morning to see if that makes a difference -She will update me in 2 weeks and if not improving will start nebivolol 2.5 mg

## 2019-03-04 ENCOUNTER — Encounter: Payer: Self-pay | Admitting: Family Medicine

## 2019-03-13 ENCOUNTER — Ambulatory Visit: Payer: Medicare Other | Admitting: Family Medicine

## 2019-03-14 ENCOUNTER — Encounter: Payer: Self-pay | Admitting: Family Medicine

## 2019-03-15 ENCOUNTER — Encounter: Payer: Self-pay | Admitting: Family Medicine

## 2019-03-25 ENCOUNTER — Other Ambulatory Visit: Payer: Self-pay

## 2019-03-25 DIAGNOSIS — Z20822 Contact with and (suspected) exposure to covid-19: Secondary | ICD-10-CM

## 2019-03-25 DIAGNOSIS — Z20828 Contact with and (suspected) exposure to other viral communicable diseases: Secondary | ICD-10-CM | POA: Diagnosis not present

## 2019-03-27 LAB — NOVEL CORONAVIRUS, NAA: SARS-CoV-2, NAA: NOT DETECTED

## 2019-03-31 ENCOUNTER — Encounter: Payer: Self-pay | Admitting: Family Medicine

## 2019-04-07 ENCOUNTER — Other Ambulatory Visit: Payer: Self-pay

## 2019-04-07 DIAGNOSIS — Z20828 Contact with and (suspected) exposure to other viral communicable diseases: Secondary | ICD-10-CM | POA: Diagnosis not present

## 2019-04-07 DIAGNOSIS — Z20822 Contact with and (suspected) exposure to covid-19: Secondary | ICD-10-CM

## 2019-04-08 LAB — NOVEL CORONAVIRUS, NAA: SARS-CoV-2, NAA: NOT DETECTED

## 2019-04-11 ENCOUNTER — Other Ambulatory Visit: Payer: Self-pay | Admitting: Family Medicine

## 2019-04-15 ENCOUNTER — Encounter: Payer: Self-pay | Admitting: Family Medicine

## 2019-04-23 ENCOUNTER — Other Ambulatory Visit: Payer: Self-pay

## 2019-04-23 ENCOUNTER — Ambulatory Visit: Payer: Medicare Other | Attending: Internal Medicine

## 2019-04-23 DIAGNOSIS — Z20822 Contact with and (suspected) exposure to covid-19: Secondary | ICD-10-CM

## 2019-04-25 LAB — NOVEL CORONAVIRUS, NAA: SARS-CoV-2, NAA: NOT DETECTED

## 2019-05-05 ENCOUNTER — Ambulatory Visit: Payer: Medicare Other | Attending: Internal Medicine

## 2019-05-05 DIAGNOSIS — Z20822 Contact with and (suspected) exposure to covid-19: Secondary | ICD-10-CM

## 2019-05-07 LAB — NOVEL CORONAVIRUS, NAA: SARS-CoV-2, NAA: NOT DETECTED

## 2019-05-30 ENCOUNTER — Encounter: Payer: Self-pay | Admitting: Family Medicine

## 2019-07-19 ENCOUNTER — Other Ambulatory Visit: Payer: Self-pay | Admitting: Family Medicine

## 2019-09-04 ENCOUNTER — Other Ambulatory Visit: Payer: Self-pay | Admitting: Family Medicine

## 2019-09-08 ENCOUNTER — Other Ambulatory Visit: Payer: Self-pay | Admitting: Family Medicine

## 2019-09-13 ENCOUNTER — Other Ambulatory Visit: Payer: Self-pay | Admitting: Family Medicine

## 2019-09-30 ENCOUNTER — Other Ambulatory Visit: Payer: Self-pay | Admitting: Family Medicine

## 2019-09-30 DIAGNOSIS — Z1231 Encounter for screening mammogram for malignant neoplasm of breast: Secondary | ICD-10-CM

## 2019-10-02 DIAGNOSIS — M1712 Unilateral primary osteoarthritis, left knee: Secondary | ICD-10-CM | POA: Diagnosis not present

## 2019-10-02 DIAGNOSIS — M25562 Pain in left knee: Secondary | ICD-10-CM | POA: Diagnosis not present

## 2019-10-13 ENCOUNTER — Encounter: Payer: Self-pay | Admitting: Family Medicine

## 2019-10-13 ENCOUNTER — Other Ambulatory Visit: Payer: Self-pay | Admitting: Family Medicine

## 2019-10-15 DIAGNOSIS — M25562 Pain in left knee: Secondary | ICD-10-CM | POA: Diagnosis not present

## 2019-10-16 ENCOUNTER — Other Ambulatory Visit: Payer: Self-pay | Admitting: Family Medicine

## 2019-10-21 DIAGNOSIS — H40023 Open angle with borderline findings, high risk, bilateral: Secondary | ICD-10-CM | POA: Diagnosis not present

## 2019-10-25 ENCOUNTER — Encounter: Payer: Self-pay | Admitting: Family Medicine

## 2019-11-07 ENCOUNTER — Ambulatory Visit
Admission: RE | Admit: 2019-11-07 | Discharge: 2019-11-07 | Disposition: A | Discharge status: Home / Self Care | Payer: Medicare Other | Source: Ambulatory Visit | Attending: Family Medicine | Admitting: Family Medicine

## 2019-11-07 ENCOUNTER — Other Ambulatory Visit: Payer: Self-pay

## 2019-11-07 DIAGNOSIS — Z1231 Encounter for screening mammogram for malignant neoplasm of breast: Secondary | ICD-10-CM

## 2019-11-10 ENCOUNTER — Other Ambulatory Visit: Payer: Self-pay | Admitting: Family Medicine

## 2019-11-19 DIAGNOSIS — M25562 Pain in left knee: Secondary | ICD-10-CM | POA: Diagnosis not present

## 2019-11-24 DIAGNOSIS — M25562 Pain in left knee: Secondary | ICD-10-CM | POA: Diagnosis not present

## 2019-11-27 DIAGNOSIS — M25562 Pain in left knee: Secondary | ICD-10-CM | POA: Diagnosis not present

## 2019-11-28 ENCOUNTER — Other Ambulatory Visit: Payer: Self-pay | Admitting: *Deleted

## 2019-11-28 MED ORDER — ROSUVASTATIN CALCIUM 40 MG PO TABS: 40.0000 mg | ORAL_TABLET | ORAL | 3 refills | Status: DC

## 2019-12-02 DIAGNOSIS — M25562 Pain in left knee: Secondary | ICD-10-CM | POA: Diagnosis not present

## 2019-12-04 ENCOUNTER — Other Ambulatory Visit: Payer: Self-pay | Admitting: Family Medicine

## 2019-12-05 DIAGNOSIS — M25562 Pain in left knee: Secondary | ICD-10-CM | POA: Diagnosis not present

## 2019-12-08 ENCOUNTER — Other Ambulatory Visit: Payer: Self-pay | Admitting: Family Medicine

## 2019-12-08 DIAGNOSIS — M25562 Pain in left knee: Secondary | ICD-10-CM | POA: Diagnosis not present

## 2019-12-11 DIAGNOSIS — M25562 Pain in left knee: Secondary | ICD-10-CM | POA: Diagnosis not present

## 2019-12-15 DIAGNOSIS — M25562 Pain in left knee: Secondary | ICD-10-CM | POA: Diagnosis not present

## 2019-12-15 DIAGNOSIS — M25552 Pain in left hip: Secondary | ICD-10-CM | POA: Diagnosis not present

## 2019-12-15 DIAGNOSIS — M1712 Unilateral primary osteoarthritis, left knee: Secondary | ICD-10-CM | POA: Diagnosis not present

## 2019-12-18 NOTE — Patient Instructions (Addendum)
Health Maintenance Due  Topic Date Due  . COVID-19 Vaccine (1) have updated chart  Never done  . INFLUENZA VACCINE - - will complete later in flu season (please let us know if you get this at another location so we can update your chart) . We should have vaccination here in 1-2 months - can call back for an appointment.   12/07/2019   blood pressure is well controlled today. I do not like that she was having lows back in June- we agreed if BP gets low again (lets say <110/60) and has lightheadedness that she will cut chlorthalidone in half and if that doesn't help within 2-3 days she will let me know.   Schedule a lab visit at the check out desk within 2-3 weeks. Return for future fasting labs meaning nothing but water after midnight please. Ok to take your medications with water

## 2019-12-18 NOTE — [facility] Notes (Signed)
Phone [phone] In person visit   Subjective:   Rhonda Spence is a 69 y.o. year old very pleasant female patient who presents for/with See problem oriented charting Chief Complaint  Patient presents with  . Hypertension   This visit occurred during the SARS-CoV-2 public health emergency.  Safety protocols were in place, including screening questions prior to the visit, additional usage of staff PPE, and extensive cleaning of exam room while observing appropriate contact time as indicated for disinfecting solutions.   Past Medical History-  Patient Active Problem List   Diagnosis Date Noted  . Vitamin D deficiency 06/08/2018    Priority: Medium  . Migraine with aura 01/11/2018    Priority: Medium  . Insulin resistance 01/11/2018    Priority: Medium  . Glaucoma 02/02/2016    Priority: Medium  . Asthma, cough variant 02/02/2016    Priority: Medium  . Obstructive sleep apnea of adult 02/02/2016    Priority: Medium  . Hyperlipidemia, unspecified 02/12/2006    Priority: Medium  . Essential hypertension 02/12/2006    Priority: Medium  . Brittle nails 02/08/2018    Priority: Low  . History of adenomatous polyp of colon 01/11/2018    Priority: Low  . Hypertrophy of nasal turbinates 02/01/2017    Priority: Low  . Allergic rhinitis 09/28/2016    Priority: Low  . Bilateral temporomandibular joint pain 09/28/2016    Priority: Low  . Seasonal and perennial allergic rhinitis 04/25/2016    Priority: Low  . Age-related nuclear cataract of both eyes 01/12/2015    Priority: Low  . NEPHROLITHIASIS, HX OF 02/12/2006    Priority: Low    Medications- reviewed and updated Current Outpatient Medications  Medication Sig Dispense Refill  . amLODipine (NORVASC) 10 MG tablet Take 1 tablet (10 mg total) by mouth daily. 90 tablet 3  . azelastine (ASTELIN) 0.1 % nasal spray USE 1 TO 2 SPRAY(S) IN EACH NOSTRIL AT BEDTIME 90 mL 0  . b complex vitamins tablet Take 1 tablet by mouth  daily.    Marland Kitchen BYSTOLIC 2.5 MG tablet TAKE 1 TABLET BY MOUTH ONCE DAILY **APPOINTMENT  REQUIRED  FOR  FUTURE  REFILLS  #[id number] 30 tablet 0  . calcium carbonate (CALCIUM 600) 600 MG TABS tablet Take 600 mg by mouth daily with breakfast.    . chlorthalidone (HYGROTON) 25 MG tablet Take 1 tablet by mouth once daily 90 tablet 0  . cholecalciferol (VITAMIN D) 1000 units tablet 5,000 Units. Take 3400 units every AM and 2000 units every PM    . Coenzyme Q10 100 MG capsule Take by mouth.    . latanoprost (XALATAN) 0.005 % ophthalmic solution Place 1 drop into both eyes at bedtime.    Marland Kitchen losartan (COZAAR) 100 MG tablet Take 1 tablet by mouth once daily 90 tablet 0  . medium chain triglycerides (MCT OIL) oil Take by mouth 3 (three) times daily. Takes 3 T qd    . montelukast (SINGULAIR) 10 MG tablet Take 1 tablet by mouth once daily with breakfast 90 tablet 0  . potassium citrate (UROCIT-K) 10 MEQ (1080 MG) SR tablet TAKE 1  BY MOUTH TWICE DAILY 180 tablet 0  . rosuvastatin (CRESTOR) 40 MG tablet Take 1 tablet (40 mg total) by mouth once a week. 13 tablet 3  . TURMERIC PO Take 1 tablet by mouth daily.    . VENTOLIN HFA 108 (90 Base) MCG/ACT inhaler Inhale 2 puffs into the lungs every 4 (four) hours as needed for wheezing  or shortness of breath. 18 g 3  . oxymetazoline (AFRIN) 0.05 % nasal spray Place 2 sprays into both nostrils daily as needed for congestion. Per Dr. Erik Obey 30 mL 0   No current facility-administered medications for this visit.     Objective:  BP 114/62   Pulse 97   Temp 98.6 F (37 C) (Temporal)   Ht 5\' 1"  (1.549 m)   Wt 152 lb (68.9 kg)   SpO2 97%   BMI 28.72 kg/m  Gen: NAD, resting comfortably CV: RRR no murmurs rubs or gallops Lungs: CTAB no crackles, wheeze, rhonchi Abdomen: soft/nontender/nondistended Ext: no edema Skin: warm, dry     Assessment and Plan   #hypertension S: medication: Bystolic 2.5Mg , chlorthalidone 25Mg , losartan 100Mg  Home readings #s:  Back  in June was gardening a lot and BPs got down into 94/55 and 106/59 range by mychart. Was also having some knee issues- PT eval noted had bad bursitis and put on round of prednisone (wonders if that contributed). Since July had not issues.  BP Readings from Last 3 Encounters:  12/22/19 114/62  02/28/19 126/74  02/28/19 126/74  A/P: blood pressure is well controlled today. I do not like that she was having lows back in June- we agreed if BP gets low again (lets say <110/60) and has lightheadedness that she will cut chlorthalidone in half and if that doesn't help within 2-3 days she will let me know.   #hyperlipidemia S: Medication:rosuvastatin 40mg  once a wek  Lab Results  Component Value Date   CHOL 282 (H) 12/31/2018   HDL 54.50 12/31/2018   LDLCALC 206 (H) 12/31/2018   LDLDIRECT 177.4 06/25/2009   TRIG 108.0 12/31/2018   CHOLHDL 5 12/31/2018   A/P: hopefully improved control- patient will come back for fasting lipids  # family history of diabetes- we will update a1c with labs. Also has had sugars in past 106-117- not sure if fasting but due to chance- we will check cbg  Recommended follow up:   6 months  Lab/Order associations:   ICD-10-CM   1. Essential hypertension  I10 CBC With Differential/Platelet    COMPLETE METABOLIC PANEL WITH GFR    Lipid Panel (Refl)  2. Family history of diabetes mellitus  Z83.3 Hemoglobin A1c  3. Hyperlipidemia, unspecified hyperlipidemia type  E78.5 CBC With Differential/Platelet    COMPLETE METABOLIC PANEL WITH GFR    Lipid Panel (Refl)  4. Vitamin D deficiency  E55.9 VITAMIN D 25 Hydroxy (Vit-D Deficiency, Fractures)  5. Hyperglycemia  R73.9 Hemoglobin A1c   Return precautions advised.  Garret Reddish, MD

## 2019-12-22 ENCOUNTER — Ambulatory Visit (INDEPENDENT_AMBULATORY_CARE_PROVIDER_SITE_OTHER): Payer: Medicare Other | Admitting: Family Medicine

## 2019-12-22 ENCOUNTER — Encounter: Payer: Self-pay | Admitting: Family Medicine

## 2019-12-22 ENCOUNTER — Other Ambulatory Visit: Payer: Self-pay

## 2019-12-22 VITALS — BP 114/62 | HR 97 | Temp 98.6°F | Ht 61.0 in | Wt 152.0 lb

## 2019-12-22 DIAGNOSIS — I10 Essential (primary) hypertension: Secondary | ICD-10-CM

## 2019-12-22 DIAGNOSIS — R739 Hyperglycemia, unspecified: Secondary | ICD-10-CM | POA: Diagnosis not present

## 2019-12-22 DIAGNOSIS — E785 Hyperlipidemia, unspecified: Secondary | ICD-10-CM

## 2019-12-22 DIAGNOSIS — Z833 Family history of diabetes mellitus: Secondary | ICD-10-CM | POA: Diagnosis not present

## 2019-12-22 DIAGNOSIS — E559 Vitamin D deficiency, unspecified: Secondary | ICD-10-CM

## 2019-12-22 MED ORDER — OXYMETAZOLINE HCL 0.05 % NA SOLN: 2.0000 | Freq: Every day | NASAL | 0 refills | Status: DC | PRN

## 2020-01-01 ENCOUNTER — Other Ambulatory Visit: Payer: Medicare Other

## 2020-01-01 ENCOUNTER — Other Ambulatory Visit: Payer: Self-pay | Admitting: Family Medicine

## 2020-01-01 ENCOUNTER — Other Ambulatory Visit: Payer: Self-pay

## 2020-01-01 DIAGNOSIS — Z833 Family history of diabetes mellitus: Secondary | ICD-10-CM | POA: Diagnosis not present

## 2020-01-01 DIAGNOSIS — R739 Hyperglycemia, unspecified: Secondary | ICD-10-CM | POA: Diagnosis not present

## 2020-01-01 DIAGNOSIS — E559 Vitamin D deficiency, unspecified: Secondary | ICD-10-CM

## 2020-01-01 DIAGNOSIS — E785 Hyperlipidemia, unspecified: Secondary | ICD-10-CM

## 2020-01-01 DIAGNOSIS — I10 Essential (primary) hypertension: Secondary | ICD-10-CM

## 2020-01-01 NOTE — Addendum Note (Signed)
Addended by: Liliane Channel on: 01/01/2020 08:51 AM   Modules accepted: Orders

## 2020-01-02 LAB — CBC WITH DIFFERENTIAL/PLATELET
Absolute Monocytes: 280 cells/uL (ref 200–950)
Basophils Absolute: 28 cells/uL (ref 0–200)
Basophils Relative: 0.5 %
Eosinophils Absolute: 123 cells/uL (ref 15–500)
Eosinophils Relative: 2.2 %
HCT: 42.7 % (ref 35.0–45.0)
Hemoglobin: 14.3 g/dL (ref 11.7–15.5)
Lymphs Abs: 2010 cells/uL (ref 850–3900)
MCH: 31 pg (ref 27.0–33.0)
MCHC: 33.5 g/dL (ref 32.0–36.0)
MCV: 92.4 fL (ref 80.0–100.0)
MPV: 9.7 fL (ref 7.5–12.5)
Monocytes Relative: 5 %
Neutro Abs: 3158 cells/uL (ref 1500–7800)
Neutrophils Relative %: 56.4 %
Platelets: 258 10*3/uL (ref 140–400)
RBC: 4.62 10*6/uL (ref 3.80–5.10)
RDW: 12.5 % (ref 11.0–15.0)
Total Lymphocyte: 35.9 %
WBC: 5.6 10*3/uL (ref 3.8–10.8)

## 2020-01-02 LAB — LIPID PANEL (REFL)
Cholesterol: 200 mg/dL — ABNORMAL HIGH (ref <200)
HDL: 51 mg/dL (ref >=50)
LDL Cholesterol (Calc): 123 mg/dL (calc) — ABNORMAL HIGH
Non-HDL Cholesterol (Calc): 149 mg/dL (calc) — ABNORMAL HIGH (ref <130)
Total CHOL/HDL Ratio: 3.9 (calc) (ref <5.0)
Triglycerides: 149 mg/dL (ref <150)

## 2020-01-02 LAB — COMPLETE METABOLIC PANEL WITH GFR
AG Ratio: 1.6 (calc) (ref 1.0–2.5)
ALT: 33 U/L — ABNORMAL HIGH (ref 6–29)
AST: 22 U/L (ref 10–35)
Albumin: 4.6 g/dL (ref 3.6–5.1)
Alkaline phosphatase (APISO): 47 U/L (ref 37–153)
BUN: 22 mg/dL (ref 7–25)
CO2: 33 mmol/L — ABNORMAL HIGH (ref 20–32)
Calcium: 10.4 mg/dL (ref 8.6–10.4)
Chloride: 99 mmol/L (ref 98–110)
Creat: 0.77 mg/dL (ref 0.60–0.93)
GFR, Est African American: 91 mL/min/{1.73_m2} (ref >=60)
GFR, Est Non African American: 78 mL/min/{1.73_m2} (ref >=60)
Globulin: 2.8 g/dL (calc) (ref 1.9–3.7)
Glucose, Bld: 110 mg/dL — ABNORMAL HIGH (ref 65–99)
Potassium: 4.4 mmol/L (ref 3.5–5.3)
Sodium: 138 mmol/L (ref 135–146)
Total Bilirubin: 0.6 mg/dL (ref 0.2–1.2)
Total Protein: 7.4 g/dL (ref 6.1–8.1)

## 2020-01-02 LAB — HEMOGLOBIN A1C
Hgb A1c MFr Bld: 5.7 % of total Hgb — ABNORMAL HIGH (ref <5.7)
Mean Plasma Glucose: 117 (calc)
eAG (mmol/L): 6.5 (calc)

## 2020-01-02 LAB — VITAMIN D 25 HYDROXY (VIT D DEFICIENCY, FRACTURES): Vit D, 25-Hydroxy: 54 ng/mL (ref 30–100)

## 2020-01-05 ENCOUNTER — Encounter: Payer: Self-pay | Admitting: Family Medicine

## 2020-01-05 ENCOUNTER — Other Ambulatory Visit: Payer: Self-pay

## 2020-01-05 MED ORDER — NEBIVOLOL HCL 2.5 MG PO TABS: ORAL_TABLET | ORAL | 3 refills | Status: DC

## 2020-01-05 MED ORDER — AZELASTINE HCL 0.1 % NA SOLN: NASAL | 0 refills | Status: DC

## 2020-01-05 MED ORDER — OXYMETAZOLINE HCL 0.05 % NA SOLN: 2.0000 | Freq: Every day | NASAL | 0 refills | Status: DC | PRN

## 2020-01-05 MED ORDER — VENTOLIN HFA 108 (90 BASE) MCG/ACT IN AERS: 2.0000 | INHALATION_SPRAY | RESPIRATORY_TRACT | 3 refills | Status: AC | PRN

## 2020-01-05 MED ORDER — MONTELUKAST SODIUM 10 MG PO TABS
10.0000 mg | ORAL_TABLET | Freq: Every day | ORAL | 3 refills | Status: DC
Start: 2020-01-05 — End: 2020-03-04

## 2020-01-05 MED ORDER — LOSARTAN POTASSIUM 100 MG PO TABS
100.0000 mg | ORAL_TABLET | Freq: Every day | ORAL | 3 refills | Status: DC
Start: 2020-01-05 — End: 2021-03-02

## 2020-01-05 MED ORDER — CHLORTHALIDONE 25 MG PO TABS
25.0000 mg | ORAL_TABLET | Freq: Every day | ORAL | 3 refills | Status: DC
Start: 2020-01-05 — End: 2020-03-04

## 2020-01-05 MED ORDER — POTASSIUM CITRATE ER 10 MEQ (1080 MG) PO TBCR
EXTENDED_RELEASE_TABLET | ORAL | 3 refills | Status: DC
Start: 2020-01-05 — End: 2021-01-17

## 2020-01-05 MED ORDER — ROSUVASTATIN CALCIUM 40 MG PO TABS: ORAL_TABLET | ORAL | 3 refills | Status: DC

## 2020-01-28 ENCOUNTER — Telehealth: Payer: Self-pay | Admitting: Family Medicine

## 2020-01-28 NOTE — Chronic Care Management (AMB) (Signed)
  Chronic Care Management   Note  01/28/2020 Name: Rhonda Spence MRN: [id number] DOB: 07/08/1950  Rhonda Spence is a 69 y.o. year old female who is a primary care patient of Marin Olp, MD. I reached out to Rhonda Spence by phone today in response to a referral sent by Rhonda Spence PCP, Marin Olp, MD.   Rhonda Spence was given information about Chronic Care Management services today including:  1. CCM service includes personalized support from designated clinical staff supervised by her physician, including individualized plan of care and coordination with other care providers 2. 24/7 contact phone numbers for assistance for urgent and routine care needs. 3. Service will only be billed when office clinical staff spend 20 minutes or more in a month to coordinate care. 4. Only one practitioner may furnish and bill the service in a calendar month. 5. The patient may stop CCM services at any time (effective at the end of the month) by phone call to the office staff.   Patient agreed to services and verbal consent obtained.   Follow up plan:  Rhonda Spence Upstream Scheduler

## 2020-02-03 DIAGNOSIS — Z23 Encounter for immunization: Secondary | ICD-10-CM | POA: Diagnosis not present

## 2020-02-20 ENCOUNTER — Telehealth: Payer: Self-pay

## 2020-02-20 NOTE — Progress Notes (Signed)
Chronic Care Management Pharmacy Assistant   Name: Rhonda Spence  MRN: [id number] DOB: [birth year]  Reason for Encounter: Initial Visit  Patient Questions:  1.  Have you seen any other providers since your last visit? No  2.  Any changes in your medicines or health? No   Rhonda Spence,  69 y.o. , female presents for their Initial CCM visit with the clinical pharmacist In office.  PCP : Marin Olp, MD  Allergies:   Allergies  Allergen Reactions  . Other Other (See Comments)  . Iodine     REACTION: congestion  . Lipitor [Atorvastatin Calcium]     felt like like arms would disconnect from body and diffuse weakness    Medications: Outpatient Encounter Medications as of 02/20/2020  Medication Sig Note  . amLODipine (NORVASC) 10 MG tablet Take 1 tablet by mouth once daily   . azelastine (ASTELIN) 0.1 % nasal spray USE 1 TO 2 SPRAY(S) IN EACH NOSTRIL AT BEDTIME   . b complex vitamins tablet Take 1 tablet by mouth daily.   . calcium carbonate (CALCIUM 600) 600 MG TABS tablet Take 600 mg by mouth daily with breakfast.   . chlorthalidone (HYGROTON) 25 MG tablet Take 1 tablet (25 mg total) by mouth daily.   . cholecalciferol (VITAMIN D) 1000 units tablet 5,000 Units. Take 3400 units every AM and 2000 units every PM 12/31/2018: Currently takes 5000 units qd  . Coenzyme Q10 100 MG capsule Take by mouth.   . latanoprost (XALATAN) 0.005 % ophthalmic solution Place 1 drop into both eyes at bedtime.   Marland Kitchen losartan (COZAAR) 100 MG tablet Take 1 tablet (100 mg total) by mouth daily.   . medium chain triglycerides (MCT OIL) oil Take by mouth 3 (three) times daily. Takes 3 T qd   . montelukast (SINGULAIR) 10 MG tablet Take 1 tablet (10 mg total) by mouth at bedtime.   . nebivolol (BYSTOLIC) 2.5 MG tablet TAKE 1 TABLET BY MOUTH ONCE DAILY **APPOINTMENT  REQUIRED  FOR  FUTURE  REFILLS  [phone]   . oxymetazoline (AFRIN) 0.05 % nasal spray Place 2 sprays into both nostrils  daily as needed for congestion. Per Dr. Erik Obey   . potassium citrate (UROCIT-K) 10 MEQ (1080 MG) SR tablet One tab twice a day   . rosuvastatin (CRESTOR) 40 MG tablet Take one tab twice a week   . TURMERIC PO Take 1 tablet by mouth daily.   . VENTOLIN HFA 108 (90 Base) MCG/ACT inhaler Inhale 2 puffs into the lungs every 4 (four) hours as needed for wheezing or shortness of breath.    No facility-administered encounter medications on file as of 02/20/2020.    Current Diagnosis: Patient Active Problem List   Diagnosis Date Noted  . Vitamin D deficiency 06/08/2018  . Brittle nails 02/08/2018  . History of adenomatous polyp of colon 01/11/2018  . Migraine with aura 01/11/2018  . Insulin resistance 01/11/2018  . Hypertrophy of nasal turbinates 02/01/2017  . Allergic rhinitis 09/28/2016  . Bilateral temporomandibular joint pain 09/28/2016  . Seasonal and perennial allergic rhinitis 04/25/2016  . Glaucoma 02/02/2016  . Asthma, cough variant 02/02/2016  . Obstructive sleep apnea of adult 02/02/2016  . Age-related nuclear cataract of both eyes 01/12/2015  . Hyperlipidemia, unspecified 02/12/2006  . Essential hypertension 02/12/2006  . NEPHROLITHIASIS, HX OF 02/12/2006     Have you seen any other providers since your last visit?         -Patient states  she has not seen any other provider.  Any changes in your medications or health?        - Patient states she has had no changes in medication.  Any side effects from any medications?       -Patient states she has no side effects from medications.  Do you have an symptoms or problems not managed by your medications?        -Patient states she has left knee and hip pain and takes tylenol for pain, however does not have any other problems not managed by medications.   Any concerns about your health right now?        - Patient states she has no concerns at this moment.  Has your provider asked that you check blood pressure, blood  sugar, or follow special diet at home?       -Patient states she does check her blood pressure but not as often as she should . Patient also states that her and her husband follow a Keto diet prescribed from Dr. Georgiann Mohs from [facility].   Do you get any type of exercise on a regular basis?       - Patient states she goes to the [facility].  Can you think of a goal you would like to reach for your health?       - Patient states she would like to maintain her health as it is.   Do you have any problems getting your medications?         -Patient states she does not have any problems getting her medications.  Is there anything that you would like to discuss during the appointment?        - Patient states she would like to discuss if she is on the right supplements and if some may be eliminated or some should be added.Patient would like to discuss the dosing of her medications to make sure they are appropriate for her.  Please bring medications and supplements to appointment   [name] ,[facility] Clinical Pharmacist Assistant [phone]   Follow-Up:  Pharmacist Review

## 2020-02-25 ENCOUNTER — Other Ambulatory Visit: Payer: Self-pay

## 2020-02-25 ENCOUNTER — Ambulatory Visit: Payer: Medicare Other

## 2020-02-25 DIAGNOSIS — I10 Essential (primary) hypertension: Secondary | ICD-10-CM

## 2020-02-25 DIAGNOSIS — E785 Hyperlipidemia, unspecified: Secondary | ICD-10-CM

## 2020-02-25 NOTE — Progress Notes (Signed)
Chronic Care Management Pharmacy  Name: Rhonda Spence  MRN: [id number] DOB: 06-30-1950  Chief Complaint/ HPI  Rhonda Spence,  69 y.o., female presents for their Initial CCM visit with the clinical pharmacist via telephone due to COVID-19 Pandemic.  PCP : Rhonda Olp, MD  Chronic conditions include:  Encounter Diagnoses  Name Primary?  . Essential hypertension Yes  . Hyperlipidemia, unspecified hyperlipidemia type     Office Visits:  12/22/2019 (PCP): hx of low Bps, was 90s/50s back in June. Agreed to cut chlorthalidone in half if Bps <110/60. 6 month f/u.  Patient Active Problem List   Diagnosis Date Noted  . Vitamin D deficiency 06/08/2018  . Brittle nails 02/08/2018  . History of adenomatous polyp of colon 01/11/2018  . Migraine with aura 01/11/2018  . Insulin resistance 01/11/2018  . Hypertrophy of nasal turbinates 02/01/2017  . Allergic rhinitis 09/28/2016  . Bilateral temporomandibular joint pain 09/28/2016  . Seasonal and perennial allergic rhinitis 04/25/2016  . Glaucoma 02/02/2016  . Asthma, cough variant 02/02/2016  . Obstructive sleep apnea of adult 02/02/2016  . Age-related nuclear cataract of both eyes 01/12/2015  . Hyperlipidemia, unspecified 02/12/2006  . Essential hypertension 02/12/2006  . NEPHROLITHIASIS, HX OF 02/12/2006   Past Surgical History:  Procedure Laterality Date  . ADENOIDECTOMY    . BREAST BIOPSY Left 07/09/2009   benign x2  . BREAST BIOPSY    . CESAREAN SECTION     x2  . COLONOSCOPY    . LITHOTRIPSY  2011   kimbrough  . POLYPECTOMY    . TONSILLECTOMY    . widom teeth extraction  1976   Family History  Problem Relation Age of Onset  . Hypertension Mother   . Heart failure Mother   . Asthma Mother   . COPD Mother        led to death early 33s  . Heart disease Father   . Heart failure Father        related to osteogenesis imperfecta. died at 52  . Osteogenesis imperfecta Father   . Osteogenesis  imperfecta Sister        possibly related to this- led to death  . Breast cancer Sister        half sister  . Stroke Brother   . Healthy Brother   . Breast cancer Maternal Aunt   . Hyperlipidemia Son   . Obesity Maternal Grandmother   . Hyperlipidemia Paternal Grandmother   . Hypertension Paternal Grandmother   . Hyperlipidemia Son   . Glaucoma Son   . Colon cancer Neg Hx   . Colon polyps Neg Hx   . Esophageal cancer Neg Hx   . Stomach cancer Neg Hx   . Rectal cancer Neg Hx    Allergies  Allergen Reactions  . Other Other (See Comments)  . Iodine     REACTION: congestion  . Lipitor [Atorvastatin Calcium]     felt like like arms would disconnect from body and diffuse weakness   Outpatient Encounter Medications as of 02/25/2020  Medication Sig  . amLODipine (NORVASC) 10 MG tablet Take 1 tablet by mouth once daily  . azelastine (ASTELIN) 0.1 % nasal spray USE 1 TO 2 SPRAY(S) IN EACH NOSTRIL AT BEDTIME  . b complex vitamins tablet Take 1 tablet by mouth daily.  . calcium carbonate (CALCIUM 600) 600 MG TABS tablet Take 600 mg by mouth daily with breakfast.  . chlorthalidone (HYGROTON) 25 MG tablet Take 1 tablet (25 mg total)  by mouth daily.  . Cholecalciferol (VITAMIN D) 125 MCG (5000 UT) CAPS Take 5,000 Units by mouth daily.   . Coenzyme Q10 100 MG capsule Take by mouth.  . latanoprost (XALATAN) 0.005 % ophthalmic solution Place 1 drop into both eyes at bedtime.  Marland Kitchen losartan (COZAAR) 100 MG tablet Take 1 tablet (100 mg total) by mouth daily.  . Misc Natural Products (GLUCOSAMINE CHOND CMP ADVANCED PO) Take 1 tablet by mouth 2 (two) times daily.  . montelukast (SINGULAIR) 10 MG tablet Take 1 tablet (10 mg total) by mouth at bedtime.  . nebivolol (BYSTOLIC) 2.5 MG tablet TAKE 1 TABLET BY MOUTH ONCE DAILY **APPOINTMENT  REQUIRED  FOR  FUTURE  REFILLS  [phone]  . potassium citrate (UROCIT-K) 10 MEQ (1080 MG) SR tablet One tab twice a day  . rosuvastatin (CRESTOR) 40 MG tablet  Take one tab twice a week  . TURMERIC PO Take 1 tablet by mouth daily.  . VENTOLIN HFA 108 (90 Base) MCG/ACT inhaler Inhale 2 puffs into the lungs every 4 (four) hours as needed for wheezing or shortness of breath.  . medium chain triglycerides (MCT OIL) oil Take by mouth 3 (three) times daily. Takes 3 T qd (Patient not taking: Reported on 02/25/2020)  . oxymetazoline (AFRIN) 0.05 % nasal spray Place 2 sprays into both nostrils daily as needed for congestion. Per Dr. Lazarus Salines (Patient not taking: Reported on 02/25/2020)   No facility-administered encounter medications on file as of 02/25/2020.   Patient Care Team    Relationship Specialty Notifications Start End  Shelva Majestic, MD PCP - General Family Medicine  01/11/18   Bettye Boeck, MD Consulting Physician Ophthalmology  02/28/19   Larey Dresser, DPM Consulting Physician Podiatry  02/28/19   Delfin Gant, MD Attending Physician Family Medicine  10/29/19   Dahlia Byes, [facility] Pharmacist Pharmacist  01/28/20    Comment: [phone]   Current Diagnosis/Assessment: Goals Addressed            This Visit's Progress   . PharmD Care Plan   On track    CARE PLAN ENTRY (see longitudinal plan of care for additional care plan information)  Current Barriers:  . Chronic Disease Management support, education, and care coordination needs related to Hypertension, Hyperlipidemia, and hyperglycemia (high blood sugar)   Hypertension BP Readings from Last 3 Encounters:  12/22/19 114/62  02/28/19 126/74  02/28/19 126/74   . Pharmacist Clinical Goal(s): o Over the next 180 days, patient will work with PharmD and providers to maintain BP goal <130/80 while ensuring medication safety (minimize dizziness) . Current regimen:  . Amlodipine 10 mg once daily . Bystolic 2.5 mg once daily  . Losartan 100 mg once daily . Chlorthalidone 25 mg once daily . Interventions: o Reviewed diet and exercise recommendations - goal to get back to the [facility]   . Patient self care activities - Over the next 180 days, patient will: o Utilize [employer] for routine exercise o Check BP daily in afternoon/evening, document, and provide at future appointments o Ensure daily salt intake < 1500 mg/day  Hyperlipidemia Lab Results  Component Value Date/Time   LDLCALC 123 (H) 01/01/2020 08:51 AM   LDLDIRECT 177.4 06/25/2009 08:33 AM   . Pharmacist Clinical Goal(s): o Over the next 365 days, patient will work with PharmD and providers to achieve LDL goal < 70 . Current regimen:  o Rosuvastatin 40 mg twice weekly  . Interventions: o Reviewed diet and exercise recommendations - goal to get  back to the [facility]  . Patient self care activities - Over the next 365 days, patient will: o Utilize [employer] for routine exercise  Hyperglycemia (high blood sugar) Lab Results  Component Value Date/Time   HGBA1C 5.7 (H) 01/01/2020 08:51 AM   HGBA1C 5.6 09/04/2017 12:00 AM   . Pharmacist Clinical Goal(s): o Over the next 180 days, patient will work with PharmD and providers to achieve A1c goal <6.5% . Current regimen:  o Diet and exercise . Interventions: o Reviewed diet and exercise recommendations - goal to get back to the [facility]  . Patient self care activities - Over the next 180 days, patient will: o Utilize [employer] for routine exercise  Medication management . Pharmacist Clinical Goal(s): o Over the next 180 days, patient will work with PharmD and providers to maintain optimal medication adherence . Current pharmacy: [facility] . Interventions o Comprehensive medication review performed. o Continue current medication management strategy . Patient self care activities - Over the next 180 days, patient will: o Take medications as prescribed o Report any questions or concerns to PharmD and/or provider(s) Initial goal documentation.      Hypertension   BP goal <130/80  BP Readings from Last 3 Encounters:  12/22/19 114/62  02/28/19 126/74  02/28/19  126/74   BMP Latest Ref Rng & Units 01/01/2020 02/28/2019 12/31/2018  Glucose 65 - 99 mg/dL 110(H) 90 106(H)  BUN 7 - 25 mg/dL $Remove'22 21 18  'YQcXuIu$ Creatinine 0.60 - 0.93 mg/dL 0.77 0.77 0.77  BUN/Creat Ratio 6 - 22 (calc) NOT APPLICABLE NOT APPLICABLE -  Sodium 161 - 146 mmol/L 138 140 139  Potassium 3.5 - 5.3 mmol/L 4.4 3.8 2.9(L)  Chloride 98 - 110 mmol/L 99 98 98  CO2 20 - 32 mmol/L 33(H) 29 30  Calcium 8.6 - 10.4 mg/dL 10.4 10.7(H) 10.1   Previous meds: HCTZ. Patient checks BP at home daily Patient home BP readings are ranging: based on pt recall -09U-045W systolic. Agreed to obtain additional readings over next week.   Occassional dizziness with postural changes while gardening.Taking potassium citrate 10 meq twice daily. Patient is currently at goal on the following medications:  . Amlodipine 10 mg once daily . Bystolic 2.5 mg once daily  . Losartan 100 mg once daily . Chlorthalidone 25 mg once daily  We discussed diet and exercise extensively. Uses the [employer], due to recent changes is husband's health has put this on pause. Is now planning to get back to [facility]. Follows ketogenic diet. High vegetable intake. Does not routinely consume alcohol.   Plan  Continue current medications and control with diet and exercise.   Hyperglycemia   A1c goal < 5.7%  Lab Results  Component Value Date/Time   HGBA1C 5.7 (H) 01/01/2020 08:51 AM   HGBA1C 5.6 09/04/2017 12:00 AM   MICROALBUR 5.0 09/04/2017 12:00 AM    Previous medications: Patient is currently near goal on the following medications:  . N/a.  We discussed: diet and exercise extensively. Consideration of metformin with a1c >6%.  Plan  Continue current medications and control with diet and exercise.  Hyperlipidemia   LDL goal < 70  Lipid Panel     Component Value Date/Time   CHOL 200 (H) 01/01/2020 0851   TRIG 149 01/01/2020 0851   HDL 51 01/01/2020 0851   LDLCALC 123 (H) 01/01/2020 0851   LDLDIRECT 177.4 06/25/2009 0833     Hepatic Function Latest Ref Rng & Units 01/01/2020 02/28/2019 12/31/2018  Total Protein 6.1 -  8.1 g/dL 7.4 7.8 7.7  Albumin 3.5 - 5.2 g/dL - - 4.6  AST 10 - 35 U/L $Remo'22 24 25  'izoYe$ ALT 6 - 29 U/L 33(H) 38(H) 44(H)  Alk Phosphatase 39 - 117 U/L - - 58  Total Bilirubin 0.2 - 1.2 mg/dL 0.6 0.4 0.4  Bilirubin, Direct 0.0 - 0.3 mg/dL - - -    The 10-year ASCVD risk score Mikey Bussing DC Jr., et al., 2013) is: 10.3%   Values used to calculate the score:     Age: 75 years     Sex: Female     Is Non-Hispanic African American: No     Diabetic: No     Tobacco smoker: No     Systolic Blood Pressure: 314 mmHg     Is BP treated: Yes     HDL Cholesterol: 51 mg/dL     Total Cholesterol: 200 mg/dL   Patient has failed these meds in past: atorvastatin (muscle weakness in arms). Patient is currently not at goal on the following medications:  . Rosuvastatin 40 mg twice weekly  We discussed:  diet and exercise extensively - see HTN.  Plan  Continue current medications and control with diet and exercise.  Osteopenia / Osteoporosis   Last DEXA Scan: 2018 - osteopenic.    Vit D, 25-Hydroxy  Date Value Ref Range Status  01/01/2020 54 30 - 100 ng/mL Final    Comment:    Vitamin D Status         25-OH Vitamin D: . Deficiency:                    <20 ng/mL Insufficiency:             20 - 29 ng/mL Optimal:                 > or = 30 ng/mL . For 25-OH Vitamin D testing on patients on  D2-supplementation and patients for whom quantitation  of D2 and D3 fractions is required, the QuestAssureD(TM) 25-OH VIT D, (D2,D3), LC/MS/MS is recommended: order  code [id number] (patients >40yrs). See Note 1 . Note 1 . For additional information, please refer to  http://education.Quest[facility].com/faq/FAQ199  (This link is being provided for informational/ educational purposes only.)     Patient is not a candidate for pharmacologic treatment  Patient has failed these meds in past: n/a. Patient is currently controlled  on the following medications:  . Calcium/vitamin D3 supplementation  We discussed:  Recommend [phone] units of vitamin D daily. Recommend 1200 mg of calcium daily from dietary and supplemental sources. Recommend weight-bearing and muscle strengthening exercises for building and maintaining bone density.  Plan  Continue current medications and control with diet and exercise.  Vaccines   Immunization History  Administered Date(s) Administered  . Fluad Quad(high Dose 65+) 12/31/2018  . Influenza Split 05/01/2011, 02/05/2017  . Influenza Whole 01/17/2008, 02/05/2010  . Influenza, High Dose Seasonal PF 01/11/2018  . Influenza,inj,Quad PF,6+ Mos 02/07/2013, 03/08/2016  . Influenza-Unspecified 03/03/2016  . MMR 09/06/2017  . PFIZER SARS-COV-2 Vaccination 06/12/2019, 07/03/2019  . Pneumococcal Conjugate-13 08/11/2014, 01/11/2018  . Pneumococcal Polysaccharide-23 09/01/2015  . Td 05/09/1999, 07/05/2009  . Tdap 09/06/2017  . Zoster 08/05/2010  . Zoster Recombinat (Shingrix) 01/17/2018, 04/25/2018   Reviewed and discussed patient's vaccination history. Pfizer COVID vaccine booster and HD flu shot received 02/03/2020.  Plan  No recommendations at this time.   Medication Management / Care Coordination   Receives prescription medications from:  [facility]  [address], [place] [address] Phone: [phone] Fax: [phone]  Denies any issues with current medication management.   Plan  Continue current medication management strategy. ___________________________ SDOH (Social Determinants of Health) assessments performed: Yes.  No future appointments. Visit follow-up:  . CPA follow-up: 1 week f/u call on home BPs 03/03/2020-03/05/2020 - we are wanting to see additional afternoon/evening readings and if patient has any dizziness. Schedule 6 mo f/u visit. Marland Kitchen RPH follow-up: 6 month telephone visit.  Madelin Rear,  Pharm.D., BCGP Clinical Pharmacist  Primary Care [phone]

## 2020-02-25 NOTE — Patient Instructions (Addendum)
Hello Lurena,   Thank you so much for meeting with me today. We will give you a call in about a week to check recent afternoon/evening home blood pressures to make sure you are not running to low (consistently less than ~110/60).   Please review care plan below and call me at [phone] with any questions!  Thank you, Edyth Gunnels., Clinical Pharmacist  Goals Addressed            This Visit's Progress   . PharmD Care Plan   On track    CARE PLAN ENTRY (see longitudinal plan of care for additional care plan information)  Current Barriers:  . Chronic Disease Management support, education, and care coordination needs related to Hypertension, Hyperlipidemia, and hyperglycemia (high blood sugar)   Hypertension BP Readings from Last 3 Encounters:  12/22/19 114/62  02/28/19 126/74  02/28/19 126/74   . Pharmacist Clinical Goal(s): o Over the next 180 days, patient will work with PharmD and providers to maintain BP goal <130/80 while ensuring medication safety (minimize dizziness) . Current regimen:  . Amlodipine 10 mg once daily . Bystolic 2.5 mg once daily  . Losartan 100 mg once daily . Chlorthalidone 25 mg once daily . Interventions: o Reviewed diet and exercise recommendations - goal to get back to the [facility]  . Patient self care activities - Over the next 180 days, patient will: o Utilize [employer] for routine exercise o Check BP daily in afternoon/evening, document, and provide at future appointments o Ensure daily salt intake < 1500 mg/day  Hyperlipidemia Lab Results  Component Value Date/Time   LDLCALC 123 (H) 01/01/2020 08:51 AM   LDLDIRECT 177.4 06/25/2009 08:33 AM   . Pharmacist Clinical Goal(s): o Over the next 365 days, patient will work with PharmD and providers to achieve LDL goal < 70 . Current regimen:  o Rosuvastatin 40 mg twice weekly  . Interventions: o Reviewed diet and exercise recommendations - goal to get back to the [facility]  . Patient self care activities -  Over the next 365 days, patient will: o Utilize [employer] for routine exercise  Hyperglycemia (high blood sugar) Lab Results  Component Value Date/Time   HGBA1C 5.7 (H) 01/01/2020 08:51 AM   HGBA1C 5.6 09/04/2017 12:00 AM   . Pharmacist Clinical Goal(s): o Over the next 180 days, patient will work with PharmD and providers to achieve A1c goal <6.5% . Current regimen:  o Diet and exercise . Interventions: o Reviewed diet and exercise recommendations - goal to get back to the [facility]  . Patient self care activities - Over the next 180 days, patient will: o Utilize [employer] for routine exercise  Medication management . Pharmacist Clinical Goal(s): o Over the next 180 days, patient will work with PharmD and providers to maintain optimal medication adherence . Current pharmacy: [facility] . Interventions o Comprehensive medication review performed. o Continue current medication management strategy . Patient self care activities - Over the next 180 days, patient will: o Take medications as prescribed o Report any questions or concerns to PharmD and/or provider(s) Initial goal documentation.     The patient verbalized understanding of instructions provided today and agreed to receive a mailed copy of patient instruction and/or educational materials.  Madelin Rear, Pharm.D., BCGP Clinical Pharmacist [facility] [phone]  Dyslipidemia Dyslipidemia is an imbalance of waxy, fat-like substances (lipids) in the blood. The body needs lipids in small amounts. Dyslipidemia often involves a high level of cholesterol or triglycerides, which are types  of lipids. Common forms of dyslipidemia include:  High levels of LDL cholesterol. LDL is the type of cholesterol that causes fatty deposits (plaques) to build up in the blood vessels that carry blood away from your heart (arteries).  Low levels of HDL cholesterol. HDL cholesterol is the type of cholesterol that protects against heart  disease. High levels of HDL remove the LDL buildup from arteries.  High levels of triglycerides. Triglycerides are a fatty substance in the blood that is linked to a buildup of plaques in the arteries. What are the causes? Primary dyslipidemia is caused by changes (mutations) in genes that are passed down through families (inherited). These mutations cause several types of dyslipidemia. Secondary dyslipidemia is caused by lifestyle choices and diseases that lead to dyslipidemia, such as:  Eating a diet that is high in animal fat.  Not getting enough exercise.  Having diabetes, kidney disease, liver disease, or thyroid disease.  Drinking large amounts of alcohol.  Using certain medicines. What increases the risk? You are more likely to develop this condition if you are an older man or if you are a woman who has gone through menopause. Other risk factors include:  Having a family history of dyslipidemia.  Taking certain medicines, including birth control pills, steroids, some diuretics, and beta-blockers.  Smoking cigarettes.  Eating a high-fat diet.  Having certain medical conditions such as diabetes, polycystic ovary syndrome (PCOS), kidney disease, liver disease, or hypothyroidism.  Not exercising regularly.  Being overweight or obese with too much belly fat. What are the signs or symptoms? In most cases, dyslipidemia does not usually cause any symptoms. In severe cases, very high lipid levels can cause:  Fatty bumps under the skin (xanthomas).  White or gray ring around the black center (pupil) of the eye. Very high triglyceride levels can cause inflammation of the pancreas (pancreatitis). How is this diagnosed? Your health care provider may diagnose dyslipidemia based on a routine blood test (fasting blood test). Because most people do not have symptoms of the condition, this blood testing (lipid profile) is done on adults age 28 and older and is repeated every 5 years.  This test checks:  Total cholesterol. This measures the total amount of cholesterol in your blood, including LDL cholesterol, HDL cholesterol, and triglycerides. A healthy number is below 200.  LDL cholesterol. The target number for LDL cholesterol is different for each person, depending on individual risk factors. Ask your health care provider what your LDL cholesterol should be.  HDL cholesterol. An HDL level of 60 or higher is best because it helps to protect against heart disease. A number below 42 for men or below 51 for women increases the risk for heart disease.  Triglycerides. A healthy triglyceride number is below 150. If your lipid profile is abnormal, your health care provider may do other blood tests. How is this treated? Treatment depends on the type of dyslipidemia that you have and your other risk factors for heart disease and stroke. Your health care provider will have a target range for your lipid levels based on this information. For many people, this condition may be treated by lifestyle changes, such as diet and exercise. Your health care provider may recommend that you:  Get regular exercise.  Make changes to your diet.  Quit smoking if you smoke. If diet changes and exercise do not help you reach your goals, your health care provider may also prescribe medicine to lower lipids. The most commonly prescribed type of medicine lowers  your LDL cholesterol (statin drug). If you have a high triglyceride level, your provider may prescribe another type of drug (fibrate) or an omega-3 fish oil supplement, or both. Follow these instructions at home:  Eating and drinking  Follow instructions from your health care provider or dietitian about eating or drinking restrictions.  Eat a healthy diet as told by your health care provider. This can help you reach and maintain a healthy weight, lower your LDL cholesterol, and raise your HDL cholesterol. This may include: ? Limiting your  calories, if you are overweight. ? Eating more fruits, vegetables, whole grains, fish, and lean meats. ? Limiting saturated fat, trans fat, and cholesterol.  If you drink alcohol: ? Limit how much you use. ? Be aware of how much alcohol is in your drink. In the [place] one drink equals one 12 oz bottle of beer (355 mL), one 5 oz glass of wine (148 mL), or one 1 oz glass of hard liquor (44 mL).  Do not drink alcohol if: ? Your health care provider tells you not to drink. ? You are pregnant, may be pregnant, or are planning to become pregnant. Activity  Get regular exercise. Start an exercise and strength training program as told by your health care provider. Ask your health care provider what activities are safe for you. Your health care provider may recommend: ? 30 minutes of aerobic activity 4-6 days a week. Brisk walking is an example of aerobic activity. ? Strength training 2 days a week. General instructions  Do not use any products that contain nicotine or tobacco, such as cigarettes, e-cigarettes, and chewing tobacco. If you need help quitting, ask your health care provider.  Take over-the-counter and prescription medicines only as told by your health care provider. This includes supplements.  Keep all follow-up visits as told by your health care provider. Contact a health care provider if:  You are: ? Having trouble sticking to your exercise or diet plan. ? Struggling to quit smoking or control your use of alcohol. Summary  Dyslipidemia often involves a high level of cholesterol or triglycerides, which are types of lipids.  Treatment depends on the type of dyslipidemia that you have and your other risk factors for heart disease and stroke.  For many people, treatment starts with lifestyle changes, such as diet and exercise.  Your health care provider may prescribe medicine to lower lipids. This information is not intended to replace advice given to you by your health care  provider. Make sure you discuss any questions you have with your health care provider. Document Revised: 12/17/2017 Document Reviewed: 11/23/2017 Elsevier Patient Education  [place].

## 2020-03-04 ENCOUNTER — Other Ambulatory Visit: Payer: Self-pay | Admitting: Family Medicine

## 2020-03-05 ENCOUNTER — Telehealth: Payer: Self-pay

## 2020-03-11 NOTE — Progress Notes (Signed)
Chronic Care Management Pharmacy Assistant   Name: Rhonda Spence  MRN: [id number] DOB: 02-13-1951  Reason for Encounter: Disease State   PCP : Rhonda Olp, MD  Allergies:   Allergies  Allergen Reactions  . Other Other (See Comments)  . Iodine     REACTION: congestion  . Lipitor [Atorvastatin Calcium]     felt like like arms would disconnect from body and diffuse weakness    Medications: Outpatient Encounter Medications as of 03/05/2020  Medication Sig  . amLODipine (NORVASC) 10 MG tablet Take 1 tablet by mouth once daily  . azelastine (ASTELIN) 0.1 % nasal spray USE 1 TO 2 SPRAY(S) IN EACH NOSTRIL AT BEDTIME  . b complex vitamins tablet Take 1 tablet by mouth daily.  . calcium carbonate (CALCIUM 600) 600 MG TABS tablet Take 600 mg by mouth daily with breakfast.  . chlorthalidone (HYGROTON) 25 MG tablet Take 1 tablet by mouth once daily  . Cholecalciferol (VITAMIN D) 125 MCG (5000 UT) CAPS Take 5,000 Units by mouth daily.   . Coenzyme Q10 100 MG capsule Take by mouth.  . latanoprost (XALATAN) 0.005 % ophthalmic solution Place 1 drop into both eyes at bedtime.  Marland Kitchen losartan (COZAAR) 100 MG tablet Take 1 tablet (100 mg total) by mouth daily.  . medium chain triglycerides (MCT OIL) oil Take by mouth 3 (three) times daily. Takes 3 T qd (Patient not taking: Reported on 02/25/2020)  . Misc Natural Products (GLUCOSAMINE CHOND CMP ADVANCED PO) Take 1 tablet by mouth 2 (two) times daily.  . montelukast (SINGULAIR) 10 MG tablet Take 1 tablet by mouth once daily with breakfast  . nebivolol (BYSTOLIC) 2.5 MG tablet TAKE 1 TABLET BY MOUTH ONCE DAILY **APPOINTMENT  REQUIRED  FOR  FUTURE  REFILLS  [id number]  . oxymetazoline (AFRIN) 0.05 % nasal spray Place 2 sprays into both nostrils daily as needed for congestion. Per Dr. Erik Obey (Patient not taking: Reported on 02/25/2020)  . potassium citrate (UROCIT-K) 10 MEQ (1080 MG) SR tablet One tab twice a day  . rosuvastatin  (CRESTOR) 40 MG tablet Take one tab twice a week  . TURMERIC PO Take 1 tablet by mouth daily.  . VENTOLIN HFA 108 (90 Base) MCG/ACT inhaler Inhale 2 puffs into the lungs every 4 (four) hours as needed for wheezing or shortness of breath.   No facility-administered encounter medications on file as of 03/05/2020.    Current Diagnosis: Patient Active Problem List   Diagnosis Date Noted  . Vitamin D deficiency 06/08/2018  . Brittle nails 02/08/2018  . History of adenomatous polyp of colon 01/11/2018  . Migraine with aura 01/11/2018  . Insulin resistance 01/11/2018  . Hypertrophy of nasal turbinates 02/01/2017  . Allergic rhinitis 09/28/2016  . Bilateral temporomandibular joint pain 09/28/2016  . Seasonal and perennial allergic rhinitis 04/25/2016  . Glaucoma 02/02/2016  . Asthma, cough variant 02/02/2016  . Obstructive sleep apnea of adult 02/02/2016  . Age-related nuclear cataract of both eyes 01/12/2015  . Hyperlipidemia, unspecified 02/12/2006  . Essential hypertension 02/12/2006  . NEPHROLITHIASIS, HX OF 02/12/2006   Reviewed chart prior to disease state call. Spoke with patient regarding BP  Recent Office Vitals: BP Readings from Last 3 Encounters:  12/22/19 114/62  02/28/19 126/74  02/28/19 126/74   Pulse Readings from Last 3 Encounters:  12/22/19 97  02/28/19 86  12/31/18 86    Wt Readings from Last 3 Encounters:  12/22/19 152 lb (68.9 kg)  02/28/19 149 lb (67.6 kg)  02/28/19 149 lb 6.4 oz (67.8 kg)     Kidney Function Lab Results  Component Value Date/Time   CREATININE 0.77 01/01/2020 08:51 AM   CREATININE 0.77 02/28/2019 02:56 PM   GFR 74.17 12/31/2018 02:54 PM   GFRNONAA 78 01/01/2020 08:51 AM   GFRAA 91 01/01/2020 08:51 AM    BMP Latest Ref Rng & Units 01/01/2020 02/28/2019 12/31/2018  Glucose 65 - 99 mg/dL 110(H) 90 106(H)  BUN 7 - 25 mg/dL 22 21 18   Creatinine 0.60 - 0.93 mg/dL 0.77 0.77 0.77  BUN/Creat Ratio 6 - 22 (calc) NOT APPLICABLE NOT  APPLICABLE -  Sodium 224 - 146 mmol/L 138 140 139  Potassium 3.5 - 5.3 mmol/L 4.4 3.8 2.9(L)  Chloride 98 - 110 mmol/L 99 98 98  CO2 20 - 32 mmol/L 33(H) 29 30  Calcium 8.6 - 10.4 mg/dL 10.4 10.7(H) 10.1    Spoke with patient in regards to blood pressure from the week of 10/27 to 10/29.   Patient stated her readings were as follows :                 10/28 - 129/74                 10/29 - 110/66  Patient denied experiencing any dizziness.  Scheduled patient's follow up appointment for 08/11/2020 at 11:00 am.   [name] ,[place] Pharmacist Assistant [phone]      [name] ,[place] Pharmacist Assistant [phone]  Follow-Up:  Pharmacist Review

## 2020-03-25 ENCOUNTER — Other Ambulatory Visit: Payer: Self-pay | Admitting: Family Medicine

## 2020-03-31 ENCOUNTER — Telehealth: Payer: Self-pay | Admitting: Family Medicine

## 2020-03-31 NOTE — Telephone Encounter (Signed)
Left message for patient to call back and schedule Medicare Annual Wellness Visit (AWV) either virtually OR in office.   Last AWV 02/28/19; please schedule at anytime with [facility]-Nurse Health Advisor at [facility].  This should be a 45 minute visit.

## 2020-04-26 ENCOUNTER — Ambulatory Visit (INDEPENDENT_AMBULATORY_CARE_PROVIDER_SITE_OTHER): Payer: Medicare Other

## 2020-04-26 DIAGNOSIS — Z0000 Encounter for general adult medical examination without abnormal findings: Secondary | ICD-10-CM

## 2020-04-26 NOTE — Progress Notes (Signed)
Virtual Visit via Telephone Note  I connected with  Rhonda Spence on 04/26/20 at 10:15 AM EST by telephone and verified that I am speaking with the correct person using two identifiers.  Medicare Annual Wellness visit completed telephonically due to Covid-19 pandemic.   Persons participating in this call: This Health Coach and this patient.   Location: Patient: Home Provider: Office    I discussed the limitations, risks, security and privacy concerns of performing an evaluation and management service by telephone and the availability of in person appointments. The patient expressed understanding and agreed to proceed.  Unable to perform video visit due to video visit attempted and failed and/or patient does not have video capability.   Some vital signs may be absent or patient reported.   Willette Brace, LPN    Subjective:   Rhonda Spence is a 69 y.o. female who presents for Medicare Annual (Subsequent) preventive examination.  Review of Systems     Cardiac Risk Factors include: advanced age (>57mn, >>50women);hypertension;dyslipidemia     Objective:    There were no vitals filed for this visit. There is no height or weight on file to calculate BMI.  Advanced Directives 04/26/2020 02/28/2019  Does Patient Have a Medical Advance Directive? Yes Yes  Type of Advance Directive Living will;Healthcare Power of [place]  Does patient want to make changes to medical advance directive? - Yes (MAU/Ambulatory/Procedural Areas - Information given)  Copy of H[place]min Chart? No - copy requested No - copy requested    Current Medications (verified) Outpatient Encounter Medications as of 04/26/2020  Medication Sig  . acetaminophen (TYLENOL 8 HOUR ARTHRITIS PAIN) 650 MG CR tablet Take 650 mg by mouth every 8 (eight) hours as needed for pain.  .Marland KitchenamLODipine (NORVASC) 10 MG tablet Take 1 tablet by mouth once  daily  . azelastine (ASTELIN) 0.1 % nasal spray USE 1 TO 2 SPRAY(S) IN EACH NOSTRIL AT BEDTIME  . b complex vitamins tablet Take 1 tablet by mouth daily.  . calcium carbonate (OS-CAL) 600 MG TABS tablet Take 600 mg by mouth daily with breakfast.  . chlorthalidone (HYGROTON) 25 MG tablet Take 1 tablet by mouth once daily  . Cholecalciferol (VITAMIN D) 125 MCG (5000 UT) CAPS Take 5,000 Units by mouth daily.   . Coenzyme Q10 100 MG capsule Take by mouth.  . latanoprost (XALATAN) 0.005 % ophthalmic solution Place 1 drop into both eyes at bedtime.  .Marland Kitchenlosartan (COZAAR) 100 MG tablet Take 1 tablet (100 mg total) by mouth daily.  . Misc Natural Products (GLUCOSAMINE CHOND CMP ADVANCED PO) Take 1 tablet by mouth 2 (two) times daily.  . montelukast (SINGULAIR) 10 MG tablet Take 1 tablet by mouth once daily with breakfast  . nebivolol (BYSTOLIC) 2.5 MG tablet TAKE 1 TABLET BY MOUTH ONCE DAILY **APPOINTMENT  REQUIRED  FOR  FUTURE  REFILLS  #[phone] . potassium citrate (UROCIT-K) 10 MEQ (1080 MG) SR tablet One tab twice a day  . psyllium (METAMUCIL) 58.6 % packet Take 1 packet by mouth daily.  . rosuvastatin (CRESTOR) 40 MG tablet Take one tab twice a week  . TURMERIC PO Take 1 tablet by mouth daily.  . VENTOLIN HFA 108 (90 Base) MCG/ACT inhaler Inhale 2 puffs into the lungs every 4 (four) hours as needed for wheezing or shortness of breath.  . medium chain triglycerides (MCT OIL) oil Take by mouth 3 (three) times daily. Takes 3  T qd (Patient not taking: Reported on 02/25/2020)  . [DISCONTINUED] oxymetazoline (AFRIN) 0.05 % nasal spray Place 2 sprays into both nostrils daily as needed for congestion. Per Dr. Erik Obey (Patient not taking: No sig reported)   No facility-administered encounter medications on file as of 04/26/2020.    Allergies (verified) Other, Iodine, and Lipitor [atorvastatin calcium]   History: Past Medical History:  Diagnosis Date  . Allergy   . Anemia   . Asthma   .  Cataract   . Chronic kidney disease    stones  . GERD (gastroesophageal reflux disease)   . Glaucoma   . Hyperlipidemia   . Hypertension   . Nephrolithiasis    has required lithotripsy  . Sleep apnea    wears an oral appliance   Past Surgical History:  Procedure Laterality Date  . ADENOIDECTOMY    . BREAST BIOPSY Left 07/09/2009   benign x2  . BREAST BIOPSY    . CESAREAN SECTION     x2  . COLONOSCOPY    . LITHOTRIPSY  2011   kimbrough  . POLYPECTOMY    . TONSILLECTOMY    . widom teeth extraction  1976   Family History  Problem Relation Age of Onset  . Hypertension Mother   . Heart failure Mother   . Asthma Mother   . COPD Mother        led to death early 30s  . Heart disease Father   . Heart failure Father        related to osteogenesis imperfecta. died at 22  . Osteogenesis imperfecta Father   . Osteogenesis imperfecta Sister        possibly related to this- led to death  . Breast cancer Sister        half sister  . Stroke Brother   . Healthy Brother   . Breast cancer Maternal Aunt   . Hyperlipidemia Son   . Obesity Maternal Grandmother   . Hyperlipidemia Paternal Grandmother   . Hypertension Paternal Grandmother   . Hyperlipidemia Son   . Glaucoma Son   . Colon cancer Neg Hx   . Colon polyps Neg Hx   . Esophageal cancer Neg Hx   . Stomach cancer Neg Hx   . Rectal cancer Neg Hx    Social History   Socioeconomic History  . Marital status: Married    Spouse name: Not on file  . Number of children: Not on file  . Years of education: Not on file  . Highest education level: Not on file  Occupational History  . Occupation: retired   . Occupation: housewife  Tobacco Use  . Smoking status: Never Smoker  . Smokeless tobacco: Never Used  Vaping Use  . Vaping Use: Never used  Substance and Sexual Activity  . Alcohol use: Not Currently    Alcohol/week: 0.0 - 1.0 standard drinks  . Drug use: No  . Sexual activity: Yes  Other Topics Concern  . Not on  file  Social History Narrative   Married (husband Fritz Pickerel patient of Dr. Yong Channel ). Son 41 with CP in group home, son 92 lives in [place]. 2 grandkids in [place] 11 and 8 in 2019 (not expecting more).       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 Book club   Social Determinants of Health   Financial Resource Strain: Low Risk   . Difficulty of Paying Living Expenses: Not hard at all  Food Insecurity: No Food Insecurity  . Worried About Charity fundraiser in the Last Year: Never true  . Ran Out of Food in the Last Year: Never true  Transportation Needs: No Transportation Needs  . Lack of Transportation (Medical): No  . Lack of Transportation (Non-Medical): No  Physical Activity: Inactive  . Days of Exercise per Week: 0 days  . Minutes of Exercise per Session: 0 min  Stress: No Stress Concern Present  . Feeling of Stress : Only a little  Social Connections: Socially Integrated  . Frequency of Communication with Friends and Family: More than three times a week  . Frequency of Social Gatherings with Friends and Family: More than three times a week  . Attends Religious Services: More than 4 times per year  . Active Member of Clubs or Organizations: Yes  . Attends Archivist Meetings: 1 to 4 times per year  . Marital Status: Married    Tobacco Counseling Counseling given: Not Answered   Clinical Intake:  Pre-visit preparation completed: Yes  Pain : No/denies pain     Nutritional Risks: None Diabetes: No  How often do you need to have someone help you when you read instructions, pamphlets, or other written materials from your doctor or pharmacy?: 1 - Never  Diabetic?No  Interpreter Needed?: No  Information entered by :: Charlott Rakes,  LPN   Activities of Daily Living In your present state of health, do you have any difficulty performing the following activities: 04/26/2020  Hearing? N  Vision? N  Difficulty concentrating or making decisions? Y  Comment memory at times  Walking or climbing stairs? N  Dressing or bathing? N  Doing errands, shopping? N  Preparing Food and eating ? N  Using the Toilet? N  In the past six months, have you accidently leaked urine? N  Do you have problems with loss of bowel control? N  Managing your Medications? N  Managing your Finances? N  Housekeeping or managing your Housekeeping? N  Some recent data might be hidden    Patient Care Team: Marin Olp, MD as PCP - General (Family Medicine) Verneita Griffes, MD as Consulting Physician (Ophthalmology) Rosemary Holms, DPM as Consulting Physician (Podiatry) Pedro Earls, MD as Attending Physician (Family Medicine) Madelin Rear, [facility] as Pharmacist (Pharmacist)  Indicate any recent Medical Services you may have received from other than [facility] providers in the past year (date may be approximate).     Assessment:   This is a routine wellness examination for Rhonda Spence.  Hearing/Vision screen  Hearing Screening   _0  _1  _2  _3  _4  _5  _6  _7  _8   Right ear:           Left ear:           Comments: Pt denies any hearing issue   Vision Screening Comments: Pt follows up with Dr Trenton Founds for annual eye exams   Dietary issues and exercise activities discussed: Current Exercise Habits: The patient does not participate in regular exercise at present  Goals    . Blood Pressure < 140/90    . Patient Stated     Get back into the [employer] first of the year    . PharmD Care Plan     CARE PLAN ENTRY (see longitudinal plan of care for additional  care plan information)  Current Barriers:  . Chronic Disease Management support, education, and care coordination needs related to Hypertension, Hyperlipidemia, and  hyperglycemia (high blood sugar)   Hypertension BP Readings from Last 3 Encounters:  12/22/19 114/62  02/28/19 126/74  02/28/19 126/74   . Pharmacist Clinical Goal(s): o Over the next 180 days, patient will work with PharmD and providers to maintain BP goal <130/80 while ensuring medication safety (minimize dizziness) . Current regimen:  . Amlodipine 10 mg once daily . Bystolic 2.5 mg once daily  . Losartan 100 mg once daily . Chlorthalidone 25 mg once daily . Interventions: o Reviewed diet and exercise recommendations - goal to get back to the [facility]  . Patient self care activities - Over the next 180 days, patient will: o Utilize [employer] for routine exercise o Check BP daily in afternoon/evening, document, and provide at future appointments o Ensure daily salt intake < 1500 mg/day  Hyperlipidemia Lab Results  Component Value Date/Time   LDLCALC 123 (H) 01/01/2020 08:51 AM   LDLDIRECT 177.4 06/25/2009 08:33 AM   . Pharmacist Clinical Goal(s): o Over the next 365 days, patient will work with PharmD and providers to achieve LDL goal < 70 . Current regimen:  o Rosuvastatin 40 mg twice weekly  . Interventions: o Reviewed diet and exercise recommendations - goal to get back to the [facility]  . Patient self care activities - Over the next 365 days, patient will: o Utilize [employer] for routine exercise  Hyperglycemia (high blood sugar) Lab Results  Component Value Date/Time   HGBA1C 5.7 (H) 01/01/2020 08:51 AM   HGBA1C 5.6 09/04/2017 12:00 AM   . Pharmacist Clinical Goal(s): o Over the next 180 days, patient will work with PharmD and providers to achieve A1c goal <6.5% . Current regimen:  o Diet and exercise . Interventions: o Reviewed diet and exercise recommendations - goal to get back to the [facility]  . Patient self care activities - Over the next 180 days, patient will: o Utilize [employer] for routine exercise  Medication management . Pharmacist Clinical Goal(s): o Over the next 180  days, patient will work with PharmD and providers to maintain optimal medication adherence . Current pharmacy: [facility] . Interventions o Comprehensive medication review performed. o Continue current medication management strategy . Patient self care activities - Over the next 180 days, patient will: o Take medications as prescribed o Report any questions or concerns to PharmD and/or provider(s) Initial goal documentation.      Depression Screen PHQ 2/9 Scores 04/26/2020 02/28/2019 01/11/2018  PHQ - 2 Score 0 0 1    Fall Risk Fall Risk  04/26/2020 12/22/2019 02/28/2019 01/11/2018  Falls in the past year? 0 0 0 No  Number falls in past yr: 0 0 - -  Injury with Fall? 0 0 0 -  Risk for fall due to : Impaired vision;Orthopedic patient - - -  Follow up Falls prevention discussed - Falls evaluation completed;Education provided;Falls prevention discussed -    FALL RISK PREVENTION PERTAINING TO THE HOME:  Any stairs in or around the home? Yes  If so, are there any without handrails? No  Home free of loose throw rugs in walkways, pet beds, electrical cords, etc? Yes  Adequate lighting in your home to reduce risk of falls? Yes   ASSISTIVE DEVICES UTILIZED TO PREVENT FALLS:  Life alert? No  Use of a cane, walker or w/c? No  Grab bars in the bathroom? No  Shower chair or  bench in shower? Yes  Elevated toilet seat or a handicapped toilet? Yes   TIMED UP AND GO:  Was the test performed? No .      Cognitive Function:        Immunizations Immunization History  Administered Date(s) Administered  . Fluad Quad(high Dose 65+) 12/31/2018  . Influenza Split 05/01/2011, 02/05/2017  . Influenza Whole 01/17/2008, 02/05/2010  . Influenza, High Dose Seasonal PF 01/11/2018  . Influenza,inj,Quad PF,6+ Mos 02/07/2013, 03/08/2016  . Influenza-Unspecified 03/03/2016, 02/03/2020  . MMR 09/06/2017  . PFIZER SARS-COV-2 Vaccination 06/12/2019, 07/03/2019, 02/03/2020  . Pneumococcal  Conjugate-13 08/11/2014, 01/11/2018  . Pneumococcal Polysaccharide-23 09/01/2015  . Td 05/09/1999, 07/05/2009  . Tdap 09/06/2017  . Zoster 08/05/2010  . Zoster Recombinat (Shingrix) 01/17/2018, 04/25/2018    TDAP status: Up to date  Flu Vaccine status: Up to date  Pneumococcal vaccine status: Up to date  Covid-19 vaccine status: Completed vaccines  Qualifies for Shingles Vaccine? Yes   Zostavax completed Yes   Shingrix Completed?: Yes  Screening Tests Health Maintenance  Topic Date Due  . MAMMOGRAM  11/06/2021  . COLONOSCOPY  04/19/2023  . TETANUS/TDAP  09/07/2027  . INFLUENZA VACCINE  Completed  . DEXA SCAN  Completed  . COVID-19 Vaccine  Completed  . Hepatitis C Screening  Completed  . PNA vac Low Risk Adult  Completed    Health Maintenance  There are no preventive care reminders to display for this patient.  Colorectal cancer screening: Type of screening: Colonoscopy. Completed 04/18/18. Repeat every 5 years  Mammogram status: Completed 11/07/19. Repeat every year  Bone Density status: Completed 08/30/16. Results reflect: Bone density results: OSTEOPENIA. Repeat every 2 years.    Additional Screening:  Hepatitis C Screening: Completed 01/21/18  Vision Screening: Recommended annual ophthalmology exams for early detection of glaucoma and other disorders of the eye. Is the patient up to date with their annual eye exam?  Yes  Who is the provider or what is the name of the office in which the patient attends annual eye exams? Dr Trenton Founds    Dental Screening: Recommended annual dental exams for proper oral hygiene  Community Resource Referral / Chronic Care Management: CRR required this visit?  No   CCM required this visit?  No      Plan:     I have personally reviewed and noted the following in the patient's chart:   . Medical and social history . Use of alcohol, tobacco or illicit drugs  . Current medications and supplements . Functional ability and  status . Nutritional status . Physical activity . Advanced directives . List of other physicians . Hospitalizations, surgeries, and ER visits in previous 12 months . Vitals . Screenings to include cognitive, depression, and falls . Referrals and appointments  In addition, I have reviewed and discussed with patient certain preventive protocols, quality metrics, and best practice recommendations. A written personalized care plan for preventive services as well as general preventive health recommendations were provided to patient.     Willette Brace, LPN   [id number]   Nurse Notes: Pt stated that her feet and ankles have been swelling every night, she also says she has been putting them up nightly as well. She stated that her nails have also become brittle.  Pt declined an appt at this time and will follow up as needed, She has monitored  BP from 11/24 -128/68, 11/27- 146/71, 12/5 -113/58, 12/12 107/61, 12/14 -125/63, Patients Heart rate has ranges from 65- 73bpm  taking  nightly between 5 pm - 7:44 pm.

## 2020-04-26 NOTE — Patient Instructions (Addendum)
Rhonda Spence , Thank you for taking time to come for your Medicare Wellness Visit. I appreciate your ongoing commitment to your health goals. Please review the following plan we discussed and let me know if I can assist you in the future.   Screening recommendations/referrals: Colonoscopy: Done 04/18/18 Mammogram: Done 11/07/19 Bone Density: Done 08/30/16 Recommended yearly ophthalmology/optometry visit for glaucoma screening and checkup Recommended yearly dental visit for hygiene and checkup  Vaccinations: Influenza vaccine: Done 01/2820 Up to date Pneumococcal vaccine: Up to date Tdap vaccine: Up to date Shingles vaccine: Completed 9/12 & 04/25/18   Covid-19:Completd 2/4, 2/25, & 02/03/20  Advanced directives: Please bring a copy of your health care power of attorney and living will to the office at your convenience.  Conditions/risks identified: Get back to the [facility]  Next appointment: Follow up in one year for your annual wellness visit     Preventive Care 65 Years and Older, Female Preventive care refers to lifestyle choices and visits with your health care provider that can promote health and wellness. What does preventive care include?  A yearly physical exam. This is also called an annual well check.  Dental exams once or twice a year.  Routine eye exams. Ask your health care provider how often you should have your eyes checked.  Personal lifestyle choices, including:  Daily care of your teeth and gums.  Regular physical activity.  Eating a healthy diet.  Avoiding tobacco and drug use.  Limiting alcohol use.  Practicing safe sex.  Taking low-dose aspirin every day.  Taking vitamin and mineral supplements as recommended by your health care provider. What happens during an annual well check? The services and screenings done by your health care provider during your annual well check will depend on your age, overall health, lifestyle risk factors, and family history  of disease. Counseling  Your health care provider may ask you questions about your:  Alcohol use.  Tobacco use.  Drug use.  Emotional well-being.  Home and relationship well-being.  Sexual activity.  Eating habits.  History of falls.  Memory and ability to understand (cognition).  Work and work Statistician.  Reproductive health. Screening  You may have the following tests or measurements:  Height, weight, and BMI.  Blood pressure.  Lipid and cholesterol levels. These may be checked every 5 years, or more frequently if you are over 23 years old.  Skin check.  Lung cancer screening. You may have this screening every year starting at age 19 if you have a 30-pack-year history of smoking and currently smoke or have quit within the past 15 years.  Fecal occult blood test (FOBT) of the stool. You may have this test every year starting at age 38.  Flexible sigmoidoscopy or colonoscopy. You may have a sigmoidoscopy every 5 years or a colonoscopy every 10 years starting at age 20.  Hepatitis C blood test.  Hepatitis B blood test.  Sexually transmitted disease (STD) testing.  Diabetes screening. This is done by checking your blood sugar (glucose) after you have not eaten for a while (fasting). You may have this done every 1-3 years.  Bone density scan. This is done to screen for osteoporosis. You may have this done starting at age 29.  Mammogram. This may be done every 1-2 years. Talk to your health care provider about how often you should have regular mammograms. Talk with your health care provider about your test results, treatment options, and if necessary, the need for more tests. Vaccines  Your health care provider may recommend certain vaccines, such as:  Influenza vaccine. This is recommended every year.  Tetanus, diphtheria, and acellular pertussis (Tdap, Td) vaccine. You may need a Td booster every 10 years.  Zoster vaccine. You may need this after age  74.  Pneumococcal 13-valent conjugate (PCV13) vaccine. One dose is recommended after age 19.  Pneumococcal polysaccharide (PPSV23) vaccine. One dose is recommended after age 2. Talk to your health care provider about which screenings and vaccines you need and how often you need them. This information is not intended to replace advice given to you by your health care provider. Make sure you discuss any questions you have with your health care provider. Document Released: 05/21/2015 Document Revised: 01/12/2016 Document Reviewed: 02/23/2015 Elsevier Interactive Patient Education  2017 [employer] Prevention in the Home Falls can cause injuries. They can happen to people of all ages. There are many things you can do to make your home safe and to help prevent falls. What can I do on the outside of my home?  Regularly fix the edges of walkways and driveways and fix any cracks.  Remove anything that might make you trip as you walk through a door, such as a raised step or threshold.  Trim any bushes or trees on the path to your home.  Use bright outdoor lighting.  Clear any walking paths of anything that might make someone trip, such as rocks or tools.  Regularly check to see if handrails are loose or broken. Make sure that both sides of any steps have handrails.  Any raised decks and porches should have guardrails on the edges.  Have any leaves, snow, or ice cleared regularly.  Use sand or salt on walking paths during winter.  Clean up any spills in your garage right away. This includes oil or grease spills. What can I do in the bathroom?  Use night lights.  Install grab bars by the toilet and in the tub and shower. Do not use towel bars as grab bars.  Use non-skid mats or decals in the tub or shower.  If you need to sit down in the shower, use a plastic, non-slip stool.  Keep the floor dry. Clean up any water that spills on the floor as soon as it happens.  Remove  soap buildup in the tub or shower regularly.  Attach bath mats securely with double-sided non-slip rug tape.  Do not have throw rugs and other things on the floor that can make you trip. What can I do in the bedroom?  Use night lights.  Make sure that you have a light by your bed that is easy to reach.  Do not use any sheets or blankets that are too big for your bed. They should not hang down onto the floor.  Have a firm chair that has side arms. You can use this for support while you get dressed.  Do not have throw rugs and other things on the floor that can make you trip. What can I do in the kitchen?  Clean up any spills right away.  Avoid walking on wet floors.  Keep items that you use a lot in easy-to-reach places.  If you need to reach something above you, use a strong step stool that has a grab bar.  Keep electrical cords out of the way.  Do not use floor polish or wax that makes floors slippery. If you must use wax, use non-skid floor wax.  Do  not have throw rugs and other things on the floor that can make you trip. What can I do with my stairs?  Do not leave any items on the stairs.  Make sure that there are handrails on both sides of the stairs and use them. Fix handrails that are broken or loose. Make sure that handrails are as long as the stairways.  Check any carpeting to make sure that it is firmly attached to the stairs. Fix any carpet that is loose or worn.  Avoid having throw rugs at the top or bottom of the stairs. If you do have throw rugs, attach them to the floor with carpet tape.  Make sure that you have a light switch at the top of the stairs and the bottom of the stairs. If you do not have them, ask someone to add them for you. What else can I do to help prevent falls?  Wear shoes that:  Do not have high heels.  Have rubber bottoms.  Are comfortable and fit you well.  Are closed at the toe. Do not wear sandals.  If you use a  stepladder:  Make sure that it is fully opened. Do not climb a closed stepladder.  Make sure that both sides of the stepladder are locked into place.  Ask someone to hold it for you, if possible.  Clearly mark and make sure that you can see:  Any grab bars or handrails.  First and last steps.  Where the edge of each step is.  Use tools that help you move around (mobility aids) if they are needed. These include:  Canes.  Walkers.  Scooters.  Crutches.  Turn on the lights when you go into a dark area. Replace any light bulbs as soon as they burn out.  Set up your furniture so you have a clear path. Avoid moving your furniture around.  If any of your floors are uneven, fix them.  If there are any pets around you, be aware of where they are.  Review your medicines with your doctor. Some medicines can make you feel dizzy. This can increase your chance of falling. Ask your doctor what other things that you can do to help prevent falls. This information is not intended to replace advice given to you by your health care provider. Make sure you discuss any questions you have with your health care provider. Document Released: 02/18/2009 Document Revised: 09/30/2015 Document Reviewed: 05/29/2014 Elsevier Interactive Patient Education  2017 [employer].

## 2020-05-10 ENCOUNTER — Other Ambulatory Visit: Payer: Medicare Other

## 2020-05-10 DIAGNOSIS — Z20822 Contact with and (suspected) exposure to covid-19: Secondary | ICD-10-CM

## 2020-05-12 LAB — NOVEL CORONAVIRUS, NAA: SARS-CoV-2, NAA: NOT DETECTED

## 2020-05-12 LAB — SARS-COV-2, NAA 2 DAY TAT

## 2020-05-28 ENCOUNTER — Telehealth: Payer: Self-pay

## 2020-05-28 NOTE — Chronic Care Management (AMB) (Signed)
Chronic Care Management Pharmacy Assistant   Name: Chloe Flis  MRN: [id number] DOB: [birth year]  Reason for Encounter: Disease State/ General Adherence Call   PCP : Marin Olp, MD  Allergies:   Allergies  Allergen Reactions  . Other Other (See Comments)  . Iodine     REACTION: congestion  . Lipitor [Atorvastatin Calcium]     felt like like arms would disconnect from body and diffuse weakness    Medications: Outpatient Encounter Medications as of 05/28/2020  Medication Sig Note  . acetaminophen (TYLENOL 8 HOUR ARTHRITIS PAIN) 650 MG CR tablet Take 650 mg by mouth every 8 (eight) hours as needed for pain. 04/26/2020: Take 2= 1300mg  in morning and evening as needed   . amLODipine (NORVASC) 10 MG tablet Take 1 tablet by mouth once daily   . azelastine (ASTELIN) 0.1 % nasal spray USE 1 TO 2 SPRAY(S) IN EACH NOSTRIL AT BEDTIME   . b complex vitamins tablet Take 1 tablet by mouth daily.   . calcium carbonate (OS-CAL) 600 MG TABS tablet Take 600 mg by mouth daily with breakfast.   . chlorthalidone (HYGROTON) 25 MG tablet Take 1 tablet by mouth once daily   . Cholecalciferol (VITAMIN D) 125 MCG (5000 UT) CAPS Take 5,000 Units by mouth daily.    . Coenzyme Q10 100 MG capsule Take by mouth.   . latanoprost (XALATAN) 0.005 % ophthalmic solution Place 1 drop into both eyes at bedtime.   Marland Kitchen losartan (COZAAR) 100 MG tablet Take 1 tablet (100 mg total) by mouth daily.   . medium chain triglycerides (MCT OIL) oil Take by mouth 3 (three) times daily. Takes 3 T qd (Patient not taking: Reported on 02/25/2020)   . Misc Natural Products (GLUCOSAMINE CHOND CMP ADVANCED PO) Take 1 tablet by mouth 2 (two) times daily.   . montelukast (SINGULAIR) 10 MG tablet Take 1 tablet by mouth once daily with breakfast   . nebivolol (BYSTOLIC) 2.5 MG tablet TAKE 1 TABLET BY MOUTH ONCE DAILY **APPOINTMENT  REQUIRED  FOR  FUTURE  REFILLS  [phone]   . potassium citrate (UROCIT-K) 10 MEQ (1080  MG) SR tablet One tab twice a day   . psyllium (METAMUCIL) 58.6 % packet Take 1 packet by mouth daily.   . rosuvastatin (CRESTOR) 40 MG tablet Take one tab twice a week   . TURMERIC PO Take 1 tablet by mouth daily.   . VENTOLIN HFA 108 (90 Base) MCG/ACT inhaler Inhale 2 puffs into the lungs every 4 (four) hours as needed for wheezing or shortness of breath.    No facility-administered encounter medications on file as of 05/28/2020.    Current Diagnosis: Patient Active Problem List   Diagnosis Date Noted  . Vitamin D deficiency 06/08/2018  . Brittle nails 02/08/2018  . History of adenomatous polyp of colon 01/11/2018  . Migraine with aura 01/11/2018  . Insulin resistance 01/11/2018  . Hypertrophy of nasal turbinates 02/01/2017  . Allergic rhinitis 09/28/2016  . Bilateral temporomandibular joint pain 09/28/2016  . Seasonal and perennial allergic rhinitis 04/25/2016  . Glaucoma 02/02/2016  . Asthma, cough variant 02/02/2016  . Obstructive sleep apnea of adult 02/02/2016  . Age-related nuclear cataract of both eyes 01/12/2015  . Hyperlipidemia, unspecified 02/12/2006  . Essential hypertension 02/12/2006  . NEPHROLITHIASIS, HX OF 02/12/2006   Have you seen any other providers since your last visit with Madelin Rear, Pharm.D., BCGP?  03/31/2020 Celedonio Miyamoto, LPN Medicare Annual Wellness Visit.  Have you had any problems recently with your health?  Patient states her and her husband recently had Covid. Patient states her husband's symptoms were more severe than hers. Patient states she had a mild scratchy throat and her symptoms were relatively mild. Patient states her and her husband have since fully recovered.  Have you had any problems with your pharmacy?  Patient states she has not had any problems with her pharmacy.  What issues or side effects are you having with your medications?  Patient states she has not had any issues or side effects from any of her  medications.  What would you like me to pass along to Madelin Rear, [place].D., BCGP for them to help you with?   Patient states she has not been able to start exercising at the [facility] just yet. Patient states she does plan to do so as soon as the weather clears up.  What can we do to take care of you better?  Patient states we are already doing a fantastic job!   April D Calhoun, [place] Pharmacist Assistant [phone]   Follow-Up:  Pharmacist Review

## 2020-06-02 ENCOUNTER — Encounter: Payer: Self-pay | Admitting: Family Medicine

## 2020-06-02 ENCOUNTER — Other Ambulatory Visit: Payer: Self-pay | Admitting: Family Medicine

## 2020-06-03 ENCOUNTER — Other Ambulatory Visit: Payer: Self-pay

## 2020-06-03 MED ORDER — MONTELUKAST SODIUM 10 MG PO TABS
ORAL_TABLET | ORAL | 2 refills | Status: DC
Start: 2020-06-03 — End: 2020-08-30

## 2020-06-03 MED ORDER — CHLORTHALIDONE 25 MG PO TABS
25.0000 mg | ORAL_TABLET | Freq: Every day | ORAL | 2 refills | Status: DC
Start: 2020-06-03 — End: 2020-09-10

## 2020-06-03 MED ORDER — AMLODIPINE BESYLATE 10 MG PO TABS
10.0000 mg | ORAL_TABLET | Freq: Every day | ORAL | 2 refills | Status: DC
Start: 2020-06-03 — End: 2021-03-28

## 2020-08-11 ENCOUNTER — Ambulatory Visit (INDEPENDENT_AMBULATORY_CARE_PROVIDER_SITE_OTHER): Payer: Medicare Other

## 2020-08-11 DIAGNOSIS — I10 Essential (primary) hypertension: Secondary | ICD-10-CM | POA: Diagnosis not present

## 2020-08-11 DIAGNOSIS — J45991 Cough variant asthma: Secondary | ICD-10-CM | POA: Diagnosis not present

## 2020-08-11 DIAGNOSIS — E785 Hyperlipidemia, unspecified: Secondary | ICD-10-CM | POA: Diagnosis not present

## 2020-08-11 NOTE — Patient Instructions (Signed)
Ms. Hayashi,  Thank you for taking the time to review your medications with me today.  I have included our care plan/goals in the following pages. Please review and call me at [phone] with any questions!  Thanks! Ellin Mayhew, Pharm.D., BCGP Clinical Pharmacist [facility] [place] [phone] Patient Care Plan: [place] Plan    Problem Identified: HLD HTN Osteopenia Vitamin D deficiency Asthma   Priority: High    Long-Range Goal: Patient-Specific Goal   Start Date: 08/11/2020  Expected End Date: 08/11/2021  This Visit's Progress: On track  Priority: High  Note:   Current Barriers:  . Cholesterol control to be determined w/ f/u labs  . Previous low BP/Dizziness - no problems noted as of 4/6  Pharmacist Clinical Goal(s):  Marland Kitchen Patient will contact provider office for questions/concerns as evidenced notation of same in electronic health record through collaboration with PharmD and provider.   Interventions: . 1:1 collaboration with Marin Olp, MD regarding development and update of comprehensive plan of care as evidenced by provider attestation and co-signature . Inter-disciplinary care team collaboration (see longitudinal plan of care) . Comprehensive medication review performed; medication list updated in electronic medical record . Clinical updates/recommendations   o Previous concern of low bp/dizziness - has not been an issue, maybe 2 episodes of dizziness past 6 months o Was needing 6 month PCP f/u 06/2020 - coordinated scheduling with front desk o Rosuvastatin 40 mg increased to BIW 12/2019. Reports to be tolerating well. Consider LDL/Lipid update at upcoming visit  Hypertension (BP goal <130/80) Has not really had any issues with dizziness maybe two episodes last 6 months upon standing. BP at home running in 120s/60s. Exercise has been a challenge due to being busy/focus on husband's urinary cancer  (diagnosed September 2021) and his upcoming chemotherapy treatment.   -Controlled -Current treatment: . Amlodipine 10 mg once daily AM . Bystolic 2.5 mg once daily AM . Losartan 100 mg once daily . Chlorthalidone 25 mg once daily  -Current home readings: 126/67 HR 71 (8pm), 122/68 HR 73 (930pm), 3/25- most recent 125/64 HR 74 (10pm) -Current dietary habits: still following keto diet, 1 drink daily (vodka and tonic) -Current exercise habits: gardening, trying to get to [place] a goal to start going back even if once per week -Denies hypotensive/hypertensive symptoms -Educated on Importance of home blood pressure monitoring; Symptoms of hypotension and importance of maintaining adequate hydration; -Counseled to monitor BP at home once every 1-2 weeks, document, and provide log at future appointments -Recommended to continue current medication  Hyperlipidemia: (LDL goal < 70) Has been tolerating rosuvastatin 40 mg dose increase to twice weekly without any problems.   -Not ideally controlled based off of labs prior to rosuvastatin adjustment  -Current treatment: . Rosuvastatin 40 mg twice weekly (01/05/2020) -Medications previously tried: Rosuvastatin 40 mg once weekly  -Educated on Cholesterol goals;  -Recommended to continue current medication Consider lipid panel or LDL check at 6 month f/u   Asthma (Goal: minimize symptoms ensure appropriate medication use) Suspects that she may have had COVID-19 infection February 2022 - this is the only time she has really ever used the rescue inhaler. Was concerned about sided effects but ended up tolerating without issue. Has not needed since.   -Controlled   -Current treatment  . Ventolin HFA 108 mcg/act as needed every 4 hours  -Denies any issues with breathing or attacks outside of February 2022 episodes. -Recommended  to continue current medication  Patient Goals/Self-Care Activities . Patient will:  - Goal to get back to [facility] - even  if once a week to start.  Follow Up Plan: 3-4 month BP call, 6 month telephone visit with pharmacist Medication Assistance: None required.  Patient affirms current coverage meets needs.  Patient's preferred pharmacy is:  [place], [place] [facility] [address] [place] [address] Phone: [phone] Fax: [phone]      The patient verbalized understanding of instructions provided today and agreed to receive a MyChart copy of patient instruction and/or educational materials. Telephone follow up appointment with pharmacy team member scheduled for: See next appointment with "Care Management Staff" under "What's Next" below.

## 2020-08-11 NOTE — Progress Notes (Signed)
Chronic Care Management Pharmacy Note  08/11/2020 Name:  Rhonda Spence MRN:  [id number] DOB:  Dec 08, 1950  Subjective: Rhonda Spence is an 70 y.o. year old female who is a primary patient of Hunter, Brayton Mars, MD.  The CCM team was consulted for assistance with disease management and care coordination needs.    Engaged with patient by telephone for follow up visit in response to provider referral for pharmacy case management and/or care coordination services.   Consent to Services:  The patient was given information about Chronic Care Management services, agreed to services, and gave verbal consent prior to initiation of services.  Please see initial visit note for detailed documentation.   Patient Care Team: Marin Olp, MD as PCP - General (Family Medicine) Verneita Griffes, MD as Consulting Physician (Ophthalmology) Rosemary Holms, DPM as Consulting Physician (Podiatry) Pedro Earls, MD as Attending Physician (Family Medicine) Madelin Rear, [facility] as Pharmacist (Pharmacist)  Objective:  Lab Results  Component Value Date   CREATININE 0.77 01/01/2020   CREATININE 0.77 02/28/2019   CREATININE 0.77 12/31/2018    Lab Results  Component Value Date   HGBA1C 5.7 (H) 01/01/2020   Last diabetic Eye exam: No results found for: HMDIABEYEEXA  Last diabetic Foot exam: No results found for: HMDIABFOOTEX      Component Value Date/Time   CHOL 200 (H) 01/01/2020 0851   TRIG 149 01/01/2020 0851   HDL 51 01/01/2020 0851   CHOLHDL 3.9 01/01/2020 0851   VLDL 21.6 12/31/2018 1454   LDLCALC 123 (H) 01/01/2020 0851   LDLDIRECT 177.4 06/25/2009 0833    Hepatic Function Latest Ref Rng & Units 01/01/2020 02/28/2019 12/31/2018  Total Protein 6.1 - 8.1 g/dL 7.4 7.8 7.7  Albumin 3.5 - 5.2 g/dL - - 4.6  AST 10 - 35 U/L $Remo'22 24 25  'HhTKj$ ALT 6 - 29 U/L 33(H) 38(H) 44(H)  Alk Phosphatase 39 - 117 U/L - - 58  Total Bilirubin 0.2 - 1.2 mg/dL 0.6 0.4 0.4  Bilirubin, Direct 0.0 - 0.3  mg/dL - - -    Lab Results  Component Value Date/Time   TSH 1.19 12/31/2018 02:54 PM   TSH 1.32 09/12/2018 09:42 AM    CBC Latest Ref Rng & Units 01/01/2020 12/31/2018 09/04/2017  WBC 3.8 - 10.8 Thousand/uL 5.6 7.6 5.5  Hemoglobin 11.7 - 15.5 g/dL 14.3 14.1 13.8  Hematocrit 35.0 - 45.0 % 42.7 41.0 43  Platelets 140 - 400 Thousand/uL 258 255.0 202    Lab Results  Component Value Date/Time   VD25OH 54 01/01/2020 08:51 AM   VD25OH 74 09/04/2017 12:00 AM    Clinical ASCVD:  The 10-year ASCVD risk score Mikey Bussing DC Jr., et al., 2013) is: 11.4%   Values used to calculate the score:     Age: 14 years     Sex: Female     Is Non-Hispanic African American: No     Diabetic: No     Tobacco smoker: No     Systolic Blood Pressure: 409 mmHg     Is BP treated: Yes     HDL Cholesterol: 51 mg/dL     Total Cholesterol: 200 mg/dL    Other: (CHADS2VASc if Afib, PHQ9 if depression, MMRC or CAT for COPD, ACT, DEXA)  Social History   Tobacco Use  Smoking Status Never Smoker  Smokeless Tobacco Never Used   BP Readings from Last 3 Encounters:  12/22/19 114/62  02/28/19 126/74  02/28/19 126/74   Pulse  Readings from Last 3 Encounters:  12/22/19 97  02/28/19 86  12/31/18 86   Wt Readings from Last 3 Encounters:  12/22/19 152 lb (68.9 kg)  02/28/19 149 lb (67.6 kg)  02/28/19 149 lb 6.4 oz (67.8 kg)    Assessment: Review of patient past medical history, allergies, medications, health status, including review of consultants reports, laboratory and other test data, was performed as part of comprehensive evaluation and provision of chronic care management services.   SDOH:  (Social Determinants of Health) assessments and interventions performed:    CCM Care Plan  Allergies  Allergen Reactions  . Other Other (See Comments)  . Iodine     REACTION: congestion  . Lipitor [Atorvastatin Calcium]     felt like like arms would disconnect from body and diffuse weakness    Medications  Reviewed Today    Reviewed by Willette Brace, LPN (Licensed Practical Nurse) on 04/26/20 at 1033  Med List Status: <None>  Medication Order Taking? Sig Documenting Provider Last Dose Status Informant  acetaminophen (TYLENOL 8 HOUR ARTHRITIS PAIN) 650 MG CR tablet [id number] Yes Take 650 mg by mouth every 8 (eight) hours as needed for pain. [provider] Taking Active            Med Note Lonie Peak Apr 26, 2020 10:33 AM) Take 2= $Remov'1300mg'ZYkUBM$  in morning and evening as needed   amLODipine (NORVASC) 10 MG tablet [id number] Yes Take 1 tablet by mouth once daily Marin Olp, MD Taking Active   azelastine (ASTELIN) 0.1 % nasal spray [id number] Yes USE 1 TO 2 SPRAY(S) IN EACH NOSTRIL AT BEDTIME Marin Olp, MD Taking Active   b complex vitamins tablet [id number] Yes Take 1 tablet by mouth daily. [provider] Taking Active   calcium carbonate (OS-CAL) 600 MG TABS tablet [id number] Yes Take 600 mg by mouth daily with breakfast. [provider] Taking Active   chlorthalidone (HYGROTON) 25 MG tablet [id number] Yes Take 1 tablet by mouth once daily Marin Olp, MD Taking Active   Cholecalciferol (VITAMIN D) 125 MCG (5000 UT) CAPS [id number] Yes Take 5,000 Units by mouth daily.  [provider] Taking Active            Med Note Leroy Sea, [name]   Wed Feb 25, 2020 10:01 AM)    Coenzyme Q10 100 MG capsule [id number] Yes Take by mouth. [provider] Taking Active   latanoprost (XALATAN) 0.005 % ophthalmic solution [id number] Yes Place 1 drop into both eyes at bedtime. [provider] Taking Active   losartan (COZAAR) 100 MG tablet [id number] Yes Take 1 tablet (100 mg total) by mouth daily. Marin Olp, MD Taking Active   medium chain triglycerides (MCT OIL) oil [id number]  Take by mouth 3 (three) times daily. Takes 3 T qd  Patient not taking: Reported on 02/25/2020   [provider]  Active   Misc Natural Products  (GLUCOSAMINE CHOND CMP ADVANCED PO) [id number] Yes Take 1 tablet by mouth 2 (two) times daily. [provider] Taking Active   montelukast (SINGULAIR) 10 MG tablet [id number] Yes Take 1 tablet by mouth once daily with breakfast Marin Olp, MD Taking Active   nebivolol (BYSTOLIC) 2.5 MG tablet [id number] Yes TAKE 1 TABLET BY MOUTH ONCE DAILY **APPOINTMENT  REQUIRED  FOR  FUTURE  REFILLS  #[phone] Marin Olp, MD Taking Active   potassium citrate (UROCIT-K) 10 MEQ (1080 MG) SR tablet [id number] Yes One tab  twice a day Marin Olp, MD Taking Active   psyllium (METAMUCIL) 58.6 % packet [id number] Yes Take 1 packet by mouth daily. [provider] Taking Active   rosuvastatin (CRESTOR) 40 MG tablet [id number] Yes Take one tab twice a week Marin Olp, MD Taking Active   TURMERIC PO [id number] Yes Take 1 tablet by mouth daily. [provider] Taking Active   VENTOLIN HFA 108 (90 Base) MCG/ACT inhaler [id number] Yes Inhale 2 puffs into the lungs every 4 (four) hours as needed for wheezing or shortness of breath. Marin Olp, MD Taking Active           Patient Active Problem List   Diagnosis Date Noted  . Vitamin D deficiency 06/08/2018  . Brittle nails 02/08/2018  . History of adenomatous polyp of colon 01/11/2018  . Migraine with aura 01/11/2018  . Insulin resistance 01/11/2018  . Hypertrophy of nasal turbinates 02/01/2017  . Allergic rhinitis 09/28/2016  . Bilateral temporomandibular joint pain 09/28/2016  . Seasonal and perennial allergic rhinitis 04/25/2016  . Glaucoma 02/02/2016  . Asthma, cough variant 02/02/2016  . Obstructive sleep apnea of adult 02/02/2016  . Age-related nuclear cataract of both eyes 01/12/2015  . Hyperlipidemia, unspecified 02/12/2006  . Essential hypertension 02/12/2006  . NEPHROLITHIASIS, HX OF 02/12/2006    Immunization History  Administered Date(s) Administered  . Fluad Quad(high Dose 65+) 12/31/2018  .  Influenza Split 05/01/2011, 02/05/2017  . Influenza Whole 01/17/2008, 02/05/2010  . Influenza, High Dose Seasonal PF 01/11/2018  . Influenza,inj,Quad PF,6+ Mos 02/07/2013, 03/08/2016  . Influenza-Unspecified 03/03/2016, 02/03/2020  . MMR 09/06/2017  . PFIZER(Purple Top)SARS-COV-2 Vaccination 06/12/2019, 07/03/2019, 02/03/2020  . Pneumococcal Conjugate-13 08/11/2014, 01/11/2018  . Pneumococcal Polysaccharide-23 09/01/2015  . Td 05/09/1999, 07/05/2009  . Tdap 09/06/2017  . Zoster 08/05/2010  . Zoster Recombinat (Shingrix) 01/17/2018, 04/25/2018    Conditions to be addressed/monitored: HLD HTN Osteopenia Vitamin D deficiency Insulin Resistance Asthma  Care Plan : CCM Pharmacy Care Plan  Updates made by Madelin Rear, [facility] since 08/11/2020 12:00 AM    Problem: HLD HTN Osteopenia Vitamin D deficiency Asthma   Priority: High    Long-Range Goal: Patient-Specific Goal   Start Date: 08/11/2020  Expected End Date: 08/11/2021  This Visit's Progress: On track  Priority: High  Note:   Current Barriers:  . Cholesterol control to be determined w/ f/u labs  . Previous low BP/Dizziness - no problems noted as of 4/6  Pharmacist Clinical Goal(s):  Marland Kitchen Patient will contact provider office for questions/concerns as evidenced notation of same in electronic health record through collaboration with PharmD and provider.   Interventions: . 1:1 collaboration with Marin Olp, MD regarding development and update of comprehensive plan of care as evidenced by provider attestation and co-signature . Inter-disciplinary care team collaboration (see longitudinal plan of care) . Comprehensive medication review performed; medication list updated in electronic medical record . Clinical updates/recommendations   o Previous concern of low bp/dizziness - has not been an issue, maybe 2 episodes of dizziness past 6 months o Was needing 6 month PCP f/u 06/2020 - coordinated scheduling with front desk o Rosuvastatin 40  mg increased to BIW 12/2019. Reports to be tolerating well. Consider LDL/Lipid update at upcoming visit  Hypertension (BP goal <130/80) Has not really had any issues with dizziness maybe two episodes last 6 months upon standing. BP at home running in 120s/60s. Exercise has been a challenge due to being busy/focus on husband's urinary cancer (diagnosed September 2021) and his upcoming  chemotherapy treatment.   -Controlled -Current treatment: . Amlodipine 10 mg once daily AM . Bystolic 2.5 mg once daily AM . Losartan 100 mg once daily . Chlorthalidone 25 mg once daily  -Current home readings: 126/67 HR 71 (8pm), 122/68 HR 73 (930pm), 3/25- most recent 125/64 HR 74 (10pm) -Current dietary habits: still following keto diet, 1 drink daily (vodka and tonic) -Current exercise habits: gardening, trying to get to [place] a goal to start going back even if once per week -Denies hypotensive/hypertensive symptoms -Educated on Importance of home blood pressure monitoring; Symptoms of hypotension and importance of maintaining adequate hydration; -Counseled to monitor BP at home once every 1-2 weeks, document, and provide log at future appointments -Recommended to continue current medication  Hyperlipidemia: (LDL goal < 70) Has been tolerating rosuvastatin 40 mg dose increase to twice weekly without any problems.   -Not ideally controlled based off of labs prior to rosuvastatin adjustment  -Current treatment: . Rosuvastatin 40 mg twice weekly (01/05/2020) -Medications previously tried: Rosuvastatin 40 mg once weekly  -Educated on Cholesterol goals;  -Recommended to continue current medication Consider lipid panel or LDL check at 6 month f/u   Asthma (Goal: minimize symptoms ensure appropriate medication use) Suspects that she may have had COVID-19 infection February 2022 - this is the only time she has really ever used the rescue inhaler. Was concerned about sided effects but ended up tolerating  without issue. Has not needed since.   -Controlled   -Current treatment  . Ventolin HFA 108 mcg/act as needed every 4 hours  -Denies any issues with breathing or attacks outside of February 2022 episodes. -Recommended to continue current medication  Patient Goals/Self-Care Activities . Patient will:  - Goal to get back to [facility] - even if once a week to start.  Follow Up Plan: 3-4 month BP call, 6 month telephone visit with pharmacist Medication Assistance: None required.  Patient affirms current coverage meets needs.  Patient's preferred pharmacy is:  [place], [place] - [address] [place] [address] Phone: [phone] Fax: [phone]     Future Appointments  Date Time Provider [place]  10/14/2020 10:00 AM Marin Olp, MD [facility]  05/05/2021 10:15 AM [facility] HEALTH COACH [facility]   Madelin Rear, [place].D., BCGP Clinical Pharmacist [place] [phone]

## 2020-08-30 ENCOUNTER — Other Ambulatory Visit: Payer: Self-pay | Admitting: Family Medicine

## 2020-09-09 ENCOUNTER — Other Ambulatory Visit: Payer: Self-pay | Admitting: Family Medicine

## 2020-09-10 ENCOUNTER — Telehealth: Payer: Self-pay

## 2020-09-10 MED ORDER — CHLORTHALIDONE 25 MG PO TABS: 25.0000 mg | ORAL_TABLET | Freq: Every day | ORAL | 2 refills | Status: DC

## 2020-09-10 NOTE — Telephone Encounter (Signed)
MEDICATION: chlorthalidone (HYGROTON) 25 MG tablet  PHARMACY:  [place] [place] Phone:  [phone]  Fax:  [phone]       Comments: Patient stated she is down to her last 7   **Let patient know to contact pharmacy at the end of the day to make sure medication is ready. **  ** Please notify patient to allow 48-72 hours to process**  **Encourage patient to contact the pharmacy for refills or they can request refills through [facility]**

## 2020-09-10 NOTE — Telephone Encounter (Signed)
Refill sent.

## 2020-09-15 DIAGNOSIS — Z23 Encounter for immunization: Secondary | ICD-10-CM | POA: Diagnosis not present

## 2020-10-07 ENCOUNTER — Other Ambulatory Visit: Payer: Self-pay | Admitting: Family Medicine

## 2020-10-07 DIAGNOSIS — Z1231 Encounter for screening mammogram for malignant neoplasm of breast: Secondary | ICD-10-CM

## 2020-10-11 ENCOUNTER — Ambulatory Visit: Payer: Medicare Other | Admitting: Family Medicine

## 2020-10-14 ENCOUNTER — Ambulatory Visit (INDEPENDENT_AMBULATORY_CARE_PROVIDER_SITE_OTHER): Payer: Medicare Other | Admitting: Family Medicine

## 2020-10-14 ENCOUNTER — Encounter: Payer: Self-pay | Admitting: Family Medicine

## 2020-10-14 ENCOUNTER — Other Ambulatory Visit: Payer: Self-pay

## 2020-10-14 VITALS — BP 120/70 | HR 77 | Temp 98.2°F | Ht 61.0 in | Wt 152.8 lb

## 2020-10-14 DIAGNOSIS — I10 Essential (primary) hypertension: Secondary | ICD-10-CM | POA: Diagnosis not present

## 2020-10-14 DIAGNOSIS — R739 Hyperglycemia, unspecified: Secondary | ICD-10-CM

## 2020-10-14 DIAGNOSIS — E785 Hyperlipidemia, unspecified: Secondary | ICD-10-CM

## 2020-10-14 DIAGNOSIS — J45991 Cough variant asthma: Secondary | ICD-10-CM

## 2020-10-14 LAB — COMPREHENSIVE METABOLIC PANEL
ALT: 26 U/L (ref 0–35)
AST: 18 U/L (ref 0–37)
Albumin: 4.8 g/dL (ref 3.5–5.2)
Alkaline Phosphatase: 47 U/L (ref 39–117)
BUN: 35 mg/dL — ABNORMAL HIGH (ref 6–23)
CO2: 30 mEq/L (ref 19–32)
Calcium: 10.1 mg/dL (ref 8.4–10.5)
Chloride: 102 mEq/L (ref 96–112)
Creatinine, Ser: 0.77 mg/dL (ref 0.40–1.20)
GFR: 77.57 mL/min (ref >60.00)
Glucose, Bld: 100 mg/dL — ABNORMAL HIGH (ref 70–99)
Potassium: 3.5 mEq/L (ref 3.5–5.1)
Sodium: 140 mEq/L (ref 135–145)
Total Bilirubin: 0.6 mg/dL (ref 0.2–1.2)
Total Protein: 7.9 g/dL (ref 6.0–8.3)

## 2020-10-14 LAB — HEMOGLOBIN A1C: Hgb A1c MFr Bld: 5.9 % (ref 4.6–6.5)

## 2020-10-14 LAB — LDL CHOLESTEROL, DIRECT: Direct LDL: 102 mg/dL

## 2020-10-14 MED ORDER — ROSUVASTATIN CALCIUM 40 MG PO TABS: ORAL_TABLET | ORAL | 3 refills | Status: DC

## 2020-10-14 NOTE — Progress Notes (Signed)
Phone [phone] In person visit   Subjective:   Rhonda Spence is a 70 y.o. year old very pleasant female patient who presents for/with See problem oriented charting Chief Complaint  Patient presents with   Hypertension   Hyperlipidemia   This visit occurred during the SARS-CoV-2 public health emergency.  Safety protocols were in place, including screening questions prior to the visit, additional usage of staff PPE, and extensive cleaning of exam room while observing appropriate contact time as indicated for disinfecting solutions.   Past Medical History-  Patient Active Problem List   Diagnosis Date Noted   Vitamin D deficiency 06/08/2018    Priority: Medium   Migraine with aura 01/11/2018    Priority: Medium   Insulin resistance 01/11/2018    Priority: Medium   Glaucoma 02/02/2016    Priority: Medium   Asthma, cough variant 02/02/2016    Priority: Medium   Obstructive sleep apnea of adult 02/02/2016    Priority: Medium   Hyperlipidemia, unspecified 02/12/2006    Priority: Medium   Essential hypertension 02/12/2006    Priority: Medium   Brittle nails 02/08/2018    Priority: Low   History of adenomatous polyp of colon 01/11/2018    Priority: Low   Hypertrophy of nasal turbinates 02/01/2017    Priority: Low   Allergic rhinitis 09/28/2016    Priority: Low   Bilateral temporomandibular joint pain 09/28/2016    Priority: Low   Seasonal and perennial allergic rhinitis 04/25/2016    Priority: Low   Age-related nuclear cataract of both eyes 01/12/2015    Priority: Low   NEPHROLITHIASIS, HX OF 02/12/2006    Priority: Low    Medications- reviewed and updated Current Outpatient Medications  Medication Sig Dispense Refill   acetaminophen (TYLENOL) 650 MG CR tablet Take 650 mg by mouth every 8 (eight) hours as needed for pain.     amLODipine (NORVASC) 10 MG tablet Take 1 tablet (10 mg total) by mouth daily. 90 tablet 2   azelastine (ASTELIN) 0.1 % nasal spray USE  1 TO 2 SPRAY(S) IN EACH NOSTRIL AT BEDTIME 90 mL 0   calcium carbonate (OS-CAL) 600 MG TABS tablet Take 600 mg by mouth daily with breakfast.     chlorthalidone (HYGROTON) 25 MG tablet Take 1 tablet (25 mg total) by mouth daily. 90 tablet 2   Cholecalciferol (VITAMIN D) 125 MCG (5000 UT) CAPS Take 5,000 Units by mouth daily.      Coenzyme Q10 100 MG capsule Take by mouth.     latanoprost (XALATAN) 0.005 % ophthalmic solution Place 1 drop into both eyes at bedtime.     losartan (COZAAR) 100 MG tablet Take 1 tablet (100 mg total) by mouth daily. 90 tablet 3   montelukast (SINGULAIR) 10 MG tablet Take 1 tablet by mouth once daily with breakfast 90 tablet 0   nebivolol (BYSTOLIC) 2.5 MG tablet TAKE 1 TABLET BY MOUTH ONCE DAILY **APPOINTMENT  REQUIRED  FOR  FUTURE  REFILLS  #[phone] 90 tablet 3   potassium citrate (UROCIT-K) 10 MEQ (1080 MG) SR tablet One tab twice a day 180 tablet 3   psyllium (METAMUCIL) 58.6 % packet Take 1 packet by mouth daily.     VENTOLIN HFA 108 (90 Base) MCG/ACT inhaler Inhale 2 puffs into the lungs every 4 (four) hours as needed for wheezing or shortness of breath. 18 g 3   b complex vitamins tablet Take 1 tablet by mouth daily. (Patient not taking: Reported on 10/14/2020)  rosuvastatin (CRESTOR) 40 MG tablet Take one tab twice a week 26 tablet 3   No current facility-administered medications for this visit.     Objective:  BP 120/70   Pulse 77   Temp 98.2 F (36.8 C) (Temporal)   Ht 5\' 1"  (1.549 m)   Wt 152 lb 12.8 oz (69.3 kg)   SpO2 98%   BMI 28.87 kg/m  Gen: NAD, resting comfortably HEENT: Oropharynx normal, nasal turbinates normal CV: RRR no murmurs rubs or gallops Lungs: CTAB no crackles, wheeze, rhonchi Abdomen: soft/nontender/nondistended/normal bowel sounds. No rebound or guarding.  Ext: minimal edema Skin: warm, dry     Assessment and Plan  #Social Updates- She started back at the [facility]. She is also going to [place] in August 2022 to go see  [employer].  #hypertension S: medication:  Losartan 100 mg, Amlodipine 10 mg, Chlorthalidone 25 mg, Bystolic 2.5 mg- she adds in potassium as well to counteract Chlorthalidone. Home readings #s: Numbers ranging from 1 teens to 130s for the most part-she takes them in the morning  and after noon and at the [facility] after workouts- goes every other day.  Blood pressures after working out may be around 595 systolic but she feels well  She is experiencing a swelling in her ankles intermittently-when swelling, She does feel tingling. She says it does not happen all the time. Compression socks only used when travelling.  She does do some elevation but has not tried compression stockings for this issue. A/P: Could be from max dose of Amlodipine 10 mg for HTN. BP Readings from Last 3 Encounters:  10/14/20 120/70  12/22/19 114/62  02/28/19 126/74   A/P: Good control of blood pressure.  She is experiencing side effects of intermittent edema-could increase to a higher dose of Bystolic from 2.5 mg to 5 mg and decrease Amlodipine from 10 mg to 5 mg potentially-she wants to monitor for now and I encouraged her to try the compression stockings  #hyperlipidemia S: Medication: Rosuvastatin 40 mg increased to twice a week last visit.  She also takes coenzyme Q10 100 mg every 10 -she wants the next refill for rosuvastatin for a 90-day supply Lab Results  Component Value Date   CHOL 200 (H) 01/01/2020   HDL 51 01/01/2020   LDLCALC 123 (H) 01/01/2020   LDLDIRECT 177.4 06/25/2009   TRIG 149 01/01/2020   CHOLHDL 3.9 01/01/2020   A/P: Hopefully improved with twice weekly dosing-update LDL with labs today.  Also monitor LFTs with very mild elevation of ALT previously.    # Asthma S: Maintenance Medication: Montelukast 10 mg As needed medication: Albuterol.  Patient is not using this on a regular basis outside of illness Believes she had COVID in 2/22- tried Ventolin HFA 108 MCG/ACT inhaler for the first  time and found it helpful- tested twice  with rapid exams and results were negative but was still unsure and not trustworthy of her tests. A/P:  Stable. Continue current medications.      # Hyperglycemia/insulin resistance/prediabetes S:  Medication: None Exercise and diet- she is exercising and trying to eat a healthy diet.  Lab Results  Component Value Date   HGBA1C 5.7 (H) 01/01/2020   HGBA1C 5.6 09/04/2017   A/P: She has maintained her weight since the last visit.  Hopefully A1c will be stable-updated labs today  # B complex- She takes a super-complex B-12. Her conversation with Edison Nasuti say that she could potentially release off of B-12. She only took it  because she had no energy. Never checked for deficiency in our system due to medicare.  We discussed since this is water-soluble I would be fine with her continuing this medication though it is not required  #Prevnar 20 discussed- her pharmacist discussed it with her after her fourth COVID booster- she is going to [place] in August and could plan to get it in future visit or at a pharmacy.  Recommended follow up: Return in about 6 months (around 04/15/2021) for follow up- or sooner if needed. Future Appointments  Date Time Provider [place]  12/01/2020  1:50 PM [facility] MM 2 [facility]  05/05/2021 10:15 AM [facility] HEALTH COACH [facility]   Lab/Order associations:   ICD-10-CM   1. Hyperlipidemia, unspecified hyperlipidemia type  E78.5 Comprehensive metabolic panel    LDL cholesterol, direct    2. Hyperglycemia  R73.9 HgB A1c    3. Essential hypertension  I10     4. Asthma, cough variant  J45.991       Meds ordered this encounter  Medications   rosuvastatin (CRESTOR) 40 MG tablet    Sig: Take one tab twice a week    Dispense:  26 tablet    Refill:  3   I,Harris Phan,acting as a scribe for Garret Reddish, MD.,have documented all relevant documentation on the behalf of Garret Reddish, MD,as directed by   Garret Reddish, MD while in the presence of Garret Reddish, MD.   I, Garret Reddish, MD, have reviewed all documentation for this visit. The documentation on 10/14/20 for the exam, diagnosis, procedures, and orders are all accurate and complete.   Return precautions advised.  Garret Reddish, MD

## 2020-10-14 NOTE — Patient Instructions (Addendum)
Prevnar 20 is a good idea but we do not have in stock today- we could give at future visit or you can receive at pharmacy  Recommended follow up: Return in about 6 months (around 04/15/2021) for follow up- or sooner if needed.  Please stop by lab before you go If you have mychart- we will send your results within 3 business days of [place] receiving them.  If you do not have mychart- we will call you about results within 5 business days of [place] receiving them.  *please also note that you will see labs on mychart as soon as they post. I will later go in and write notes on them- will say "notes from Dr. Yong Channel"

## 2020-10-15 ENCOUNTER — Encounter: Payer: Self-pay | Admitting: Family Medicine

## 2020-10-19 DIAGNOSIS — Z23 Encounter for immunization: Secondary | ICD-10-CM | POA: Diagnosis not present

## 2020-10-20 DIAGNOSIS — M25552 Pain in left hip: Secondary | ICD-10-CM | POA: Diagnosis not present

## 2020-10-20 DIAGNOSIS — M25562 Pain in left knee: Secondary | ICD-10-CM | POA: Diagnosis not present

## 2020-10-22 DIAGNOSIS — M25562 Pain in left knee: Secondary | ICD-10-CM | POA: Diagnosis not present

## 2020-10-22 DIAGNOSIS — M25552 Pain in left hip: Secondary | ICD-10-CM | POA: Diagnosis not present

## 2020-10-26 DIAGNOSIS — H40023 Open angle with borderline findings, high risk, bilateral: Secondary | ICD-10-CM | POA: Diagnosis not present

## 2020-10-28 DIAGNOSIS — M25552 Pain in left hip: Secondary | ICD-10-CM | POA: Diagnosis not present

## 2020-10-28 DIAGNOSIS — M25562 Pain in left knee: Secondary | ICD-10-CM | POA: Diagnosis not present

## 2020-10-29 ENCOUNTER — Telehealth: Payer: Self-pay

## 2020-10-29 NOTE — Chronic Care Management (AMB) (Signed)
Chronic Care Management Pharmacy Assistant   Name: Rhonda Spence  MRN: [id number] DOB: [birth year]   Reason for Encounter: General adherence   Recent office visits:  10/14/20-Rhonda Yong Channel MD (PCP)-  Office visit for management of chronic conditions. No medication changes, follow up in 6 months.   Recent consult visits:  10/20/20-Rhonda Delilah Shan MD (Family practice)-Limited data available.   Hospital visits:  None in previous 6 months  Medications: Outpatient Encounter Medications as of 10/29/2020  Medication Sig Note   acetaminophen (TYLENOL) 650 MG CR tablet Take 650 mg by mouth every 8 (eight) hours as needed for pain. 04/26/2020: Take 2= 1300mg  in morning and evening as needed    amLODipine (NORVASC) 10 MG tablet Take 1 tablet (10 mg total) by mouth daily.    azelastine (ASTELIN) 0.1 % nasal spray USE 1 TO 2 SPRAY(S) IN EACH NOSTRIL AT BEDTIME    b complex vitamins tablet Take 1 tablet by mouth daily. (Patient not taking: Reported on 10/14/2020)    calcium carbonate (OS-CAL) 600 MG TABS tablet Take 600 mg by mouth daily with breakfast.    chlorthalidone (HYGROTON) 25 MG tablet Take 1 tablet (25 mg total) by mouth daily.    Cholecalciferol (VITAMIN D) 125 MCG (5000 UT) CAPS Take 5,000 Units by mouth daily.     Coenzyme Q10 100 MG capsule Take by mouth.    latanoprost (XALATAN) 0.005 % ophthalmic solution Place 1 drop into both eyes at bedtime.    losartan (COZAAR) 100 MG tablet Take 1 tablet (100 mg total) by mouth daily.    montelukast (SINGULAIR) 10 MG tablet Take 1 tablet by mouth once daily with breakfast    nebivolol (BYSTOLIC) 2.5 MG tablet TAKE 1 TABLET BY MOUTH ONCE DAILY **APPOINTMENT  REQUIRED  FOR  FUTURE  REFILLS  [phone]    potassium citrate (UROCIT-K) 10 MEQ (1080 MG) SR tablet One tab twice a day    psyllium (METAMUCIL) 58.6 % packet Take 1 packet by mouth daily.    rosuvastatin (CRESTOR) 40 MG tablet Take one tab twice a week    VENTOLIN HFA 108 (90  Base) MCG/ACT inhaler Inhale 2 puffs into the lungs every 4 (four) hours as needed for wheezing or shortness of breath.    No facility-administered encounter medications on file as of 10/29/2020.    Have you had any problems recently with your health? Patient reported her knee and hip are bothering her again.  She went to her orthopedic and just finished prednisone.  She is set to see Dr Delilah Spence at the end of July and possibly receive an injection in her knee.  She is going to [place] in August and doesn't want to be in pain.  She was also referred to physical therapy and is going once a week.  She also goes to the [facility].    Have you had any problems with your pharmacy? Patient denies problems obtaining her medications from her pharmacy. She stated all her meds are filled and she is not sure why the last fill dates are dated back to December and January for star rating drugs.   What issues or side effects are you having with your medications? Patient was having intermittent ankle swelling, which she says Dr Yong Spence thinks is due to the Amlodipine.  She stated Dr Yong Spence told her just to observe and let him know if it becomes a problem, which she says it currently is not.   What would you like  me to pass along to Edison Nasuti Potts,CPP for them to help you with?  Patient just wanted to tell Edison Nasuti "Hello and thanks for checking on her".  What can we do to take care of you better? Patient stated there is nothing else needed at this time to take better care of her.   Star Rating Drugs:  Losartan 100mg  Last filled 06/02/20  90 DS Rosuvastatin 40mg   Last filled 04/28/20  28 DS  [place]

## 2020-11-02 DIAGNOSIS — M25562 Pain in left knee: Secondary | ICD-10-CM | POA: Diagnosis not present

## 2020-11-02 DIAGNOSIS — M25552 Pain in left hip: Secondary | ICD-10-CM | POA: Diagnosis not present

## 2020-11-11 DIAGNOSIS — M25562 Pain in left knee: Secondary | ICD-10-CM | POA: Diagnosis not present

## 2020-11-11 DIAGNOSIS — M25552 Pain in left hip: Secondary | ICD-10-CM | POA: Diagnosis not present

## 2020-11-18 DIAGNOSIS — M25552 Pain in left hip: Secondary | ICD-10-CM | POA: Diagnosis not present

## 2020-11-18 DIAGNOSIS — M25562 Pain in left knee: Secondary | ICD-10-CM | POA: Diagnosis not present

## 2020-11-19 ENCOUNTER — Telehealth: Payer: Self-pay | Admitting: Cardiology

## 2020-11-19 NOTE — Telephone Encounter (Signed)
Pt was calling on behalf of her son.

## 2020-11-19 NOTE — Telephone Encounter (Signed)
Pt is wanting to speak with Dr. Percival Spanish in regards to echo cardio results.. please advise

## 2020-11-25 DIAGNOSIS — M25552 Pain in left hip: Secondary | ICD-10-CM | POA: Diagnosis not present

## 2020-11-25 DIAGNOSIS — M25562 Pain in left knee: Secondary | ICD-10-CM | POA: Diagnosis not present

## 2020-11-30 DIAGNOSIS — M1712 Unilateral primary osteoarthritis, left knee: Secondary | ICD-10-CM | POA: Diagnosis not present

## 2020-11-30 DIAGNOSIS — M25552 Pain in left hip: Secondary | ICD-10-CM | POA: Diagnosis not present

## 2020-11-30 DIAGNOSIS — M25551 Pain in right hip: Secondary | ICD-10-CM | POA: Diagnosis not present

## 2020-12-01 ENCOUNTER — Ambulatory Visit
Admission: RE | Admit: 2020-12-01 | Discharge: 2020-12-01 | Disposition: A | Discharge status: Home / Self Care | Payer: Medicare Other | Source: Ambulatory Visit | Attending: Family Medicine | Admitting: Family Medicine

## 2020-12-01 ENCOUNTER — Other Ambulatory Visit: Payer: Self-pay

## 2020-12-01 DIAGNOSIS — Z1231 Encounter for screening mammogram for malignant neoplasm of breast: Secondary | ICD-10-CM | POA: Diagnosis not present

## 2020-12-02 ENCOUNTER — Telehealth: Payer: Self-pay | Admitting: Pharmacist

## 2020-12-02 DIAGNOSIS — M25562 Pain in left knee: Secondary | ICD-10-CM | POA: Diagnosis not present

## 2020-12-02 DIAGNOSIS — M25552 Pain in left hip: Secondary | ICD-10-CM | POA: Diagnosis not present

## 2020-12-02 NOTE — Chronic Care Management (AMB) (Addendum)
Chronic Care Management Pharmacy Assistant   Name: Verbal Holsten  MRN: [id number] DOB: [birth year]   Reason for Encounter: Disease State - General Adherence   Recent office visits:  None noted.  Recent consult visits:  11/30/20 [facility] - Hip pain - No notes available.   11/25/20 [place] - Left knee pain - No notes available.   11/18/20 [place] - Left knee pain - No notes available.  11/11/20 [place] - Left knee pain - No notes available.   11/02/20 [place] - Left hip pain - No notes available.   Hospital visits:  None in previous 6 months  Medications: Outpatient Encounter Medications as of 12/02/2020  Medication Sig Note   acetaminophen (TYLENOL) 650 MG CR tablet Take 650 mg by mouth every 8 (eight) hours as needed for pain. 04/26/2020: Take 2= '1300mg'$  in morning and evening as needed    amLODipine (NORVASC) 10 MG tablet Take 1 tablet (10 mg total) by mouth daily.    azelastine (ASTELIN) 0.1 % nasal spray USE 1 TO 2 SPRAY(S) IN EACH NOSTRIL AT BEDTIME    b complex vitamins tablet Take 1 tablet by mouth daily. (Patient not taking: Reported on 10/14/2020)    calcium carbonate (OS-CAL) 600 MG TABS tablet Take 600 mg by mouth daily with breakfast.    chlorthalidone (HYGROTON) 25 MG tablet Take 1 tablet (25 mg total) by mouth daily.    Cholecalciferol (VITAMIN D) 125 MCG (5000 UT) CAPS Take 5,000 Units by mouth daily.     Coenzyme Q10 100 MG capsule Take by mouth.    latanoprost (XALATAN) 0.005 % ophthalmic solution Place 1 drop into both eyes at bedtime.    losartan (COZAAR) 100 MG tablet Take 1 tablet (100 mg total) by mouth daily.    montelukast (SINGULAIR) 10 MG tablet Take 1 tablet by mouth once daily with breakfast    nebivolol (BYSTOLIC) 2.5 MG tablet TAKE 1 TABLET BY MOUTH ONCE DAILY **APPOINTMENT  REQUIRED  FOR  FUTURE  REFILLS  [phone]    potassium citrate  (UROCIT-K) 10 MEQ (1080 MG) SR tablet One tab twice a day    psyllium (METAMUCIL) 58.6 % packet Take 1 packet by mouth daily.    rosuvastatin (CRESTOR) 40 MG tablet Take one tab twice a week    VENTOLIN HFA 108 (90 Base) MCG/ACT inhaler Inhale 2 puffs into the lungs every 4 (four) hours as needed for wheezing or shortness of breath.    No facility-administered encounter medications on file as of 12/02/2020.    Have you had any problems recently with your health? Patient reports she has been having some hip and knee pain. She had her last appointment with Orthopedics.   Have you had any problems with your pharmacy? Patient denies any issues with her pharmacy at this time.   What issues or side effects are you having with your medications? Patient denies having any side effects or issues with her current medications.   What would you like me to pass along to Leata Mouse, CPP for them to help you with?  Patient would like to have her Crestor refilled for 90 days. She has been getting a 30 day prescription and she is leaving for a trip to [place] in a few weeks and wont have enough medicine. Her refill is due 12/07/20. She also requests refills on Singulair, Bystolic 2.5 mg, and her Potassium which she  reports she is taking twice a day.   What can we do to take care of you better? Patient did not have any recommendations at this time. She is happy with her current level of care.   Star Rating Drugs: losartan (COZAAR) 100 MG tablet 11/26/20 90 days (patient read refill dates off current bottle) rosuvastatin (CRESTOR) 40 MG tablet 11/06/20 30 days (patient read refill dates off current bottle)  Jobe Gibbon, [place] Pharmacist Assistant  [phone]

## 2020-12-03 ENCOUNTER — Other Ambulatory Visit: Payer: Self-pay | Admitting: Family Medicine

## 2021-01-11 ENCOUNTER — Telehealth: Payer: Self-pay | Admitting: Pharmacist

## 2021-01-11 NOTE — Progress Notes (Addendum)
Chronic Care Management Pharmacy Assistant   Name: Virna Chaudhari  MRN: [id number] DOB: July 17, 1950   Reason for Encounter: General Adherence Call     Recent office visits:  None  Recent consult visits:  None  Hospital visits:  None in previous 6 months  Medications: Outpatient Encounter Medications as of 01/11/2021  Medication Sig Note   acetaminophen (TYLENOL) 650 MG CR tablet Take 650 mg by mouth every 8 (eight) hours as needed for pain. 04/26/2020: Take 2= '1300mg'$  in morning and evening as needed    amLODipine (NORVASC) 10 MG tablet Take 1 tablet (10 mg total) by mouth daily.    azelastine (ASTELIN) 0.1 % nasal spray USE 1 TO 2 SPRAY(S) IN EACH NOSTRIL AT BEDTIME    b complex vitamins tablet Take 1 tablet by mouth daily. (Patient not taking: Reported on 10/14/2020)    calcium carbonate (OS-CAL) 600 MG TABS tablet Take 600 mg by mouth daily with breakfast.    chlorthalidone (HYGROTON) 25 MG tablet Take 1 tablet (25 mg total) by mouth daily.    Cholecalciferol (VITAMIN D) 125 MCG (5000 UT) CAPS Take 5,000 Units by mouth daily.     Coenzyme Q10 100 MG capsule Take by mouth.    latanoprost (XALATAN) 0.005 % ophthalmic solution Place 1 drop into both eyes at bedtime.    losartan (COZAAR) 100 MG tablet Take 1 tablet (100 mg total) by mouth daily.    montelukast (SINGULAIR) 10 MG tablet Take 1 tablet by mouth once daily with breakfast    nebivolol (BYSTOLIC) 2.5 MG tablet TAKE 1 TABLET BY MOUTH ONCE DAILY **APPOINTMENT  REQUIRED  FOR  FUTURE  REFILLS  [phone]    potassium citrate (UROCIT-K) 10 MEQ (1080 MG) SR tablet One tab twice a day    psyllium (METAMUCIL) 58.6 % packet Take 1 packet by mouth daily.    rosuvastatin (CRESTOR) 40 MG tablet Take one tab twice a week    VENTOLIN HFA 108 (90 Base) MCG/ACT inhaler Inhale 2 puffs into the lungs every 4 (four) hours as needed for wheezing or shortness of breath.    No facility-administered encounter medications on file as of  01/11/2021.    Patient Questions:  Have you had any problems recently with your health? Pt stated while on vacation in [place], while walking her ankle started swelling but she used her compression socks and it went away. She also stated her Orthopedics gave her Prednisone to help as well.  Have you had any problems with your pharmacy? Patient states no issues   What issues or side effects are you having with your medications? Patient states no side effects  What would you like me to pass along to Leata Mouse, CPP for him to help you with?  Patient states she is doing well   What can we do to take care of you better? Pt has nothing else to add.    Care Gaps: Zoster Vaccines- Shingrix:Completed COVID-19 Vaccination: Completed PPSV23 Vaccination:Completed Hepatitis C: Completed Dexa Scan: Completed  Influenza Vaccination: Overdue since 12/06/20 Mammogram: Next due on 12/02/22 Colonoscopy: Next due on 04/19/23 Tetanus/DTAP: Next due on 09/07/27 Annual Wellness: Scheduled 05/05/21 HPV Vaccines: Aged Out    Future Appointments  Date Time Provider [place]  05/05/2021 10:15 AM [facility] HE[facility] COACH [facility]    Star Rating Drugs: Rosuvastatin '40mg'$  last refilled on 11/06/20 28ds Losartan '100mg'$  last refilled on 08/30/20 [place]  8 minutes spent in  review, coordination, and documentation.  Reviewed by: Beverly Milch, PharmD Clinical Pharmacist [phone]

## 2021-01-17 ENCOUNTER — Other Ambulatory Visit: Payer: Self-pay | Admitting: Family Medicine

## 2021-01-25 ENCOUNTER — Other Ambulatory Visit: Payer: Self-pay | Admitting: Family Medicine

## 2021-01-29 DIAGNOSIS — Z23 Encounter for immunization: Secondary | ICD-10-CM | POA: Diagnosis not present

## 2021-03-01 ENCOUNTER — Other Ambulatory Visit: Payer: Self-pay | Admitting: Family Medicine

## 2021-03-28 ENCOUNTER — Other Ambulatory Visit: Payer: Self-pay | Admitting: Family Medicine

## 2021-04-14 ENCOUNTER — Telehealth: Payer: Self-pay | Admitting: Family Medicine

## 2021-04-14 NOTE — Telephone Encounter (Signed)
Copied from [place] [id number]. Topic: Medicare AWV >> Apr 14, 2021 10:14 AM Harris-Coley, Hannah Beat wrote: Reason for CRM: LVM 04/14/21 @10 :12am to confirm appt change date from 05/05/21 to 05/17/21. [facility] out of office. Please confirm khc

## 2021-04-16 ENCOUNTER — Other Ambulatory Visit: Payer: Self-pay | Admitting: Family Medicine

## 2021-04-26 DIAGNOSIS — H2513 Age-related nuclear cataract, bilateral: Secondary | ICD-10-CM | POA: Diagnosis not present

## 2021-04-26 DIAGNOSIS — H40023 Open angle with borderline findings, high risk, bilateral: Secondary | ICD-10-CM | POA: Diagnosis not present

## 2021-04-26 DIAGNOSIS — Z8669 Personal history of other diseases of the nervous system and sense organs: Secondary | ICD-10-CM | POA: Diagnosis not present

## 2021-05-05 ENCOUNTER — Ambulatory Visit: Payer: Medicare Other

## 2021-05-17 ENCOUNTER — Other Ambulatory Visit: Payer: Self-pay

## 2021-05-17 ENCOUNTER — Ambulatory Visit (INDEPENDENT_AMBULATORY_CARE_PROVIDER_SITE_OTHER): Payer: Medicare Other

## 2021-05-17 DIAGNOSIS — Z0000 Encounter for general adult medical examination without abnormal findings: Secondary | ICD-10-CM

## 2021-05-17 NOTE — Progress Notes (Signed)
Virtual Visit via Telephone Note  I connected with  Rhonda Spence on 05/17/21 at 11:00 AM EST by telephone and verified that I am speaking with the correct person using two identifiers.  Medicare Annual Wellness visit completed telephonically due to Covid-19 pandemic.   Persons participating in this call: This Health Coach and this patient.   Location: Patient: Home Provider: Office   I discussed the limitations, risks, security and privacy concerns of performing an evaluation and management service by telephone and the availability of in person appointments. The patient expressed understanding and agreed to proceed.  Unable to perform video visit due to video visit attempted and failed and/or patient does not have video capability.   Some vital signs may be absent or patient reported.   Willette Brace, LPN   Subjective:   Rhonda Spence is a 71 y.o. female who presents for Medicare Annual (Subsequent) preventive examination.  Review of Systems     Cardiac Risk Factors include: advanced age (>32men, >63 women);dyslipidemia;hypertension     Objective:    There were no vitals filed for this visit. There is no height or weight on file to calculate BMI.  Advanced Directives 05/17/2021 04/26/2020 02/28/2019  Does Patient Have a Medical Advance Directive? Yes Yes Yes  Type of Advance Directive Living will Living will;Healthcare Power of [place];Living will  Does patient want to make changes to medical advance directive? - - Yes (MAU/Ambulatory/Procedural Areas - Information given)  Copy of [place] in Chart? - No - copy requested No - copy requested    Current Medications (verified) Outpatient Encounter Medications as of 05/17/2021  Medication Sig   acetaminophen (TYLENOL) 650 MG CR tablet Take 650 mg by mouth every 8 (eight) hours as needed for pain. As needed   amLODipine (NORVASC) 10 MG tablet Take 1 tablet by  mouth once daily   azelastine (ASTELIN) 0.1 % nasal spray USE 1 TO 2 SPRAY(S) IN EACH NOSTRIL AT BEDTIME   calcium carbonate (OS-CAL) 600 MG TABS tablet Take 600 mg by mouth daily with breakfast.   chlorthalidone (HYGROTON) 25 MG tablet Take 1 tablet (25 mg total) by mouth daily.   Cholecalciferol (VITAMIN D) 125 MCG (5000 UT) CAPS Take 5,000 Units by mouth daily.    Coenzyme Q10 100 MG capsule Take by mouth.   latanoprost (XALATAN) 0.005 % ophthalmic solution Place 1 drop into both eyes at bedtime.   losartan (COZAAR) 100 MG tablet Take 1 tablet by mouth once daily   montelukast (SINGULAIR) 10 MG tablet Take 1 tablet by mouth once daily with breakfast   nebivolol (BYSTOLIC) 2.5 MG tablet Take 1 tablet by mouth once daily   potassium citrate (UROCIT-K) 10 MEQ (1080 MG) SR tablet TAKE 1  BY MOUTH TWICE DAILY   psyllium (METAMUCIL) 58.6 % packet Take 1 packet by mouth daily.   rosuvastatin (CRESTOR) 40 MG tablet Take one tab twice a week   VENTOLIN HFA 108 (90 Base) MCG/ACT inhaler Inhale 2 puffs into the lungs every 4 (four) hours as needed for wheezing or shortness of breath.   Cholecalciferol (VITAMIN D3 PO) Vitamin D3   [DISCONTINUED] b complex vitamins tablet Take 1 tablet by mouth daily. (Patient not taking: Reported on 05/17/2021)   No facility-administered encounter medications on file as of 05/17/2021.    Allergies (verified) Other, Iodine, and Lipitor [atorvastatin calcium]   History: Past Medical History:  Diagnosis Date   Allergy    Anemia  Asthma    Cataract    Chronic kidney disease    stones   GERD (gastroesophageal reflux disease)    Glaucoma    Hyperlipidemia    Hypertension    Nephrolithiasis    has required lithotripsy   Sleep apnea    wears an oral appliance   Past Surgical History:  Procedure Laterality Date   ADENOIDECTOMY     BREAST BIOPSY Left 07/09/2009   benign x2   BREAST BIOPSY     CESAREAN SECTION     x2   COLONOSCOPY     LITHOTRIPSY  2011    kimbrough   POLYPECTOMY     TONSILLECTOMY     widom teeth extraction  1976   Family History  Problem Relation Age of Onset   Hypertension Mother    Heart failure Mother    Asthma Mother    COPD Mother        led to death early 90s   Heart disease Father    Heart failure Father        related to osteogenesis imperfecta. died at 1   Osteogenesis imperfecta Father    Osteogenesis imperfecta Sister        possibly related to this- led to death   Breast cancer Sister        half sister   Stroke Brother    Healthy Brother    Breast cancer Maternal Aunt    Hyperlipidemia Son    Obesity Maternal Grandmother    Hyperlipidemia Paternal Grandmother    Hypertension Paternal Grandmother    Hyperlipidemia Son    Glaucoma Son    Colon cancer Neg Hx    Colon polyps Neg Hx    Esophageal cancer Neg Hx    Stomach cancer Neg Hx    Rectal cancer Neg Hx    Social History   Socioeconomic History   Marital status: Married    Spouse name: Not on file   Number of children: Not on file   Years of education: Not on file   Highest education level: Not on file  Occupational History   Occupation: retired    Occupation: housewife  Tobacco Use   Smoking status: Never   Smokeless tobacco: Never  Vaping Use   Vaping Use: Never used  Substance and Sexual Activity   Alcohol use: Not Currently    Alcohol/week: 0.0 - 1.0 standard drinks   Drug use: No   Sexual activity: Yes  Other Topics Concern   Not on file  Social History Narrative   Married (husband Fritz Pickerel patient of Dr. Yong Channel ). Son 10 with CP in group home, son 6 lives in [place]. 2 grandkids in [place] 11 and 8 in 2019 (not expecting more).       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 Book club    Social Determinants of Radio broadcast assistant Strain: Low Risk    Difficulty of Paying Living Expenses: Not hard at all  Food Insecurity: No Food Insecurity   Worried About Charity fundraiser in the Last Year: Never true   Arboriculturist in the Last Year: Never true  Transportation Needs: No Transportation Needs   Lack of Transportation (Medical): No   Lack of Transportation (Non-Medical):  No  Physical Activity: Inactive   Days of Exercise per Week: 0 days   Minutes of Exercise per Session: 0 min  Stress: No Stress Concern Present   Feeling of Stress : Only a little  Social Connections: Engineer, building services of Communication with Friends and Family: More than three times a week   Frequency of Social Gatherings with Friends and Family: More than three times a week   Attends Religious Services: More than 4 times per year   Active Member of [employer] or Organizations: Yes   Attends Archivist Meetings: 1 to 4 times per year   Marital Status: Married    Tobacco Counseling Counseling given: Not Answered   Clinical Intake:  Pre-visit preparation completed: Yes  Pain : No/denies pain     BMI - recorded: 28.89 Nutritional Status: BMI 25 -29 Overweight Nutritional Risks: None Diabetes: No  How often do you need to have someone help you when you read instructions, pamphlets, or other written materials from your doctor or pharmacy?: 1 - Never  Diabetic?no  Interpreter Needed?: No  Information entered by :: Charlott Rakes, LPN   Activities of Daily Living In your present state of health, do you have any difficulty performing the following activities: 05/17/2021  Hearing? N  Vision? N  Difficulty concentrating or making decisions? Y  Comment at times memory  Walking or climbing stairs? N  Dressing or bathing? N  Doing errands, shopping? N  Preparing Food and eating ? N  Using the Toilet? N  In the past six months, have you accidently leaked  urine? Y  Comment urgency at times with a drip occassionally  Do you have problems with loss of bowel control? N  Managing your Medications? N  Managing your Finances? N  Housekeeping or managing your Housekeeping? N  Some recent data might be hidden    Patient Care Team: Marin Olp, MD as PCP - General (Family Medicine) Verneita Griffes, MD as Consulting Physician (Ophthalmology) Rosemary Holms, DPM as Consulting Physician (Podiatry) Pedro Earls, MD as Attending Physician (Family Medicine) Madelin Rear, [facility] as Pharmacist (Pharmacist)  Indicate any recent Medical Services you may have received from other than [facility] providers in the past year (date may be approximate).     Assessment:   This is a routine wellness examination for Rhonda Spence.  Hearing/Vision screen Hearing Screening - Comments:: Pt denies any hearing issues  Vision Screening - Comments:: Pt follows up with [facility] for annual eye exams   Dietary issues and exercise activities discussed: Current Exercise Habits: The patient does not participate in regular exercise at present   Goals Addressed             This Visit's Progress    Patient Stated       Get back to working out        Depression Screen PHQ 2/9 Scores 05/17/2021 10/14/2020 04/26/2020 02/28/2019 01/11/2018  PHQ - 2 Score 0 0 0 0 1    Fall Risk Fall Risk  05/17/2021 10/14/2020 04/26/2020 12/22/2019 02/28/2019  Falls in the past year? 0 0 0 0 0  Number falls in past yr: 0 0 0 0 -  Injury with Fall? 0 0 0 0 0  Risk for fall due to : Impaired vision;Impaired balance/gait - Impaired vision;Orthopedic patient - -  Risk for fall due to: Comment dizzy at times may be med related - - - -  Follow up Falls prevention discussed -  Falls prevention discussed - Falls evaluation completed;Education provided;Falls prevention discussed    FALL RISK PREVENTION PERTAINING TO THE HOME:  Any stairs in or around the home? Yes  If so, are there any  without handrails? No  Home free of loose throw rugs in walkways, pet beds, electrical cords, etc? Yes  Adequate lighting in your home to reduce risk of falls? Yes   ASSISTIVE DEVICES UTILIZED TO PREVENT FALLS:  Life alert? No  Use of a cane, walker or w/c? No  Grab bars in the bathroom? No  Shower chair or bench in shower? Yes  Elevated toilet seat or a handicapped toilet? No   TIMED UP AND GO:  Was the test performed? No .  Cognitive Function:     6CIT Screen 05/17/2021  What Year? 0 points  What month? 0 points  What time? 0 points  Count back from 20 0 points  Months in reverse 0 points  Repeat phrase 0 points  Total Score 0    Immunizations Immunization History  Administered Date(s) Administered   Fluad Quad(high Dose 65+) 12/31/2018   Influenza Split 05/01/2011, 02/05/2017   Influenza Whole 01/17/2008, 02/05/2010   Influenza, High Dose Seasonal PF 01/11/2018   Influenza,inj,Quad PF,6+ Mos 02/07/2013, 03/08/2016   Influenza-Unspecified 03/03/2016, 02/03/2020, 01/29/2021   MMR 09/06/2017   PFIZER(Purple Top)SARS-COV-2 Vaccination 06/12/2019, 07/03/2019, 02/03/2020, 09/15/2020, 01/29/2021   PNEUMOCOCCAL CONJUGATE-20 10/19/2020   Pneumococcal Conjugate-13 08/11/2014, 01/11/2018   Pneumococcal Polysaccharide-23 09/01/2015   Td 05/09/1999, 07/05/2009   Tdap 09/06/2017   Zoster Recombinat (Shingrix) 01/17/2018, 04/25/2018   Zoster, Live 08/05/2010    TDAP status: Up to date  Flu Vaccine status: Up to date  Pneumococcal vaccine status: Up to date  Covid-19 vaccine status: Completed vaccines  Qualifies for Shingles Vaccine? Yes   Zostavax completed Yes   Shingrix Completed?: Yes  Screening Tests Health Maintenance  Topic Date Due   MAMMOGRAM  12/02/2022   COLONOSCOPY (Pts 45-23yrs Insurance coverage will need to be confirmed)  04/19/2023   TETANUS/TDAP  09/07/2027   Pneumonia Vaccine 49+ Years old  Completed   INFLUENZA VACCINE  Completed   DEXA SCAN   Completed   COVID-19 Vaccine  Completed   Hepatitis C Screening  Completed   Zoster Vaccines- Shingrix  Completed   HPV VACCINES  Aged Out    Health Maintenance  There are no preventive care reminders to display for this patient.  Colorectal cancer screening: Type of screening: Colonoscopy. Completed 04/18/18. Repeat every 5 years  Mammogram status: Completed 12/01/20. Repeat every year  Bone Density status: Completed 08/30/16. Results reflect: Bone density results: OSTEOPENIA. Repeat every 2 years.  Additional Screening:  Hepatitis C Screening:  Completed 12/01/20  Vision Screening: Recommended annual ophthalmology exams for early detection of glaucoma and other disorders of the eye. Is the patient up to date with their annual eye exam?  Yes  Who is the provider or what is the name of the office in which the patient attends annual eye exams? [facility] If pt is not established with a provider, would they like to be referred to a provider to establish care? No .   Dental Screening: Recommended annual dental exams for proper oral hygiene  Community Resource Referral / Chronic Care Management: CRR required this visit?  No   CCM required this visit?  No      Plan:     I have personally reviewed and noted the following in the patients chart:   Medical and social  history Use of alcohol, tobacco or illicit drugs  Current medications and supplements including opioid prescriptions.  Functional ability and status Nutritional status Physical activity Advanced directives List of other physicians Hospitalizations, surgeries, and ER visits in previous 12 months Vitals Screenings to include cognitive, depression, and falls Referrals and appointments  In addition, I have reviewed and discussed with patient certain preventive protocols, quality metrics, and best practice recommendations. A written personalized care plan for preventive services as well as general preventive health  recommendations were provided to patient.     Willette Brace, LPN   [id number]   Nurse Notes: none

## 2021-05-17 NOTE — Patient Instructions (Signed)
Rhonda Spence , Thank you for taking time to come for your Medicare Wellness Visit. I appreciate your ongoing commitment to your health goals. Please review the following plan we discussed and let me know if I can assist you in the future.   Screening recommendations/referrals: Colonoscopy: Done 04/18/18 repeat every 5 years Mammogram: Done 12/01/20 repeat every year Bone Density: Done 08/30/16 repeat every 2 years  Recommended yearly ophthalmology/optometry visit for glaucoma screening and checkup Recommended yearly dental visit for hygiene and checkup  Vaccinations: Influenza vaccine: Done 01/29/21 repeat every year  Pneumococcal vaccine: Up to date Tdap vaccine: Done 09/06/17 repeat every 10 years  Shingles vaccine: Completed   9/12 & 04/25/18 Covid-19:Completed 2/4, 2/25, 02/03/20 & 5/11, 01/29/21  Advanced directives: Please bring a copy of your health care power of attorney and living will to the office at your convenience.  Conditions/risks identified: Get back to working out  Next appointment: Follow up in one year for your annual wellness visit    Preventive Care 65 Years and Older, Female Preventive care refers to lifestyle choices and visits with your health care provider that can promote health and wellness. What does preventive care include? A yearly physical exam. This is also called an annual well check. Dental exams once or twice a year. Routine eye exams. Ask your health care provider how often you should have your eyes checked. Personal lifestyle choices, including: Daily care of your teeth and gums. Regular physical activity. Eating a healthy diet. Avoiding tobacco and drug use. Limiting alcohol use. Practicing safe sex. Taking low-dose aspirin every day. Taking vitamin and mineral supplements as recommended by your health care provider. What happens during an annual well check? The services and screenings done by your health care provider during your annual well  check will depend on your age, overall health, lifestyle risk factors, and family history of disease. Counseling  Your health care provider may ask you questions about your: Alcohol use. Tobacco use. Drug use. Emotional well-being. Home and relationship well-being. Sexual activity. Eating habits. History of falls. Memory and ability to understand (cognition). Work and work Statistician. Reproductive health. Screening  You may have the following tests or measurements: Height, weight, and BMI. Blood pressure. Lipid and cholesterol levels. These may be checked every 5 years, or more frequently if you are over 34 years old. Skin check. Lung cancer screening. You may have this screening every year starting at age 41 if you have a 30-pack-year history of smoking and currently smoke or have quit within the past 15 years. Fecal occult blood test (FOBT) of the stool. You may have this test every year starting at age 53. Flexible sigmoidoscopy or colonoscopy. You may have a sigmoidoscopy every 5 years or a colonoscopy every 10 years starting at age 41. Hepatitis C blood test. Hepatitis B blood test. Sexually transmitted disease (STD) testing. Diabetes screening. This is done by checking your blood sugar (glucose) after you have not eaten for a while (fasting). You may have this done every 1-3 years. Bone density scan. This is done to screen for osteoporosis. You may have this done starting at age 14. Mammogram. This may be done every 1-2 years. Talk to your health care provider about how often you should have regular mammograms. Talk with your health care provider about your test results, treatment options, and if necessary, the need for more tests. Vaccines  Your health care provider may recommend certain vaccines, such as: Influenza vaccine. This is recommended every year. Tetanus,  diphtheria, and acellular pertussis (Tdap, Td) vaccine. You may need a Td booster every 10 years. Zoster  vaccine. You may need this after age 27. Pneumococcal 13-valent conjugate (PCV13) vaccine. One dose is recommended after age 71. Pneumococcal polysaccharide (PPSV23) vaccine. One dose is recommended after age 8. Talk to your health care provider about which screenings and vaccines you need and how often you need them. This information is not intended to replace advice given to you by your health care provider. Make sure you discuss any questions you have with your health care provider. Document Released: 05/21/2015 Document Revised: 01/12/2016 Document Reviewed: 02/23/2015 Elsevier Interactive Patient Education  2017 [place] Prevention in the Home Falls can cause injuries. They can happen to people of all ages. There are many things you can do to make your home safe and to help prevent falls. What can I do on the outside of my home? Regularly fix the edges of walkways and driveways and fix any cracks. Remove anything that might make you trip as you walk through a door, such as a raised step or threshold. Trim any bushes or trees on the path to your home. Use bright outdoor lighting. Clear any walking paths of anything that might make someone trip, such as rocks or tools. Regularly check to see if handrails are loose or broken. Make sure that both sides of any steps have handrails. Any raised decks and porches should have guardrails on the edges. Have any leaves, snow, or ice cleared regularly. Use sand or salt on walking paths during winter. Clean up any spills in your garage right away. This includes oil or grease spills. What can I do in the bathroom? Use night lights. Install grab bars by the toilet and in the tub and shower. Do not use towel bars as grab bars. Use non-skid mats or decals in the tub or shower. If you need to sit down in the shower, use a plastic, non-slip stool. Keep the floor dry. Clean up any water that spills on the floor as soon as it happens. Remove  soap buildup in the tub or shower regularly. Attach bath mats securely with double-sided non-slip rug tape. Do not have throw rugs and other things on the floor that can make you trip. What can I do in the bedroom? Use night lights. Make sure that you have a light by your bed that is easy to reach. Do not use any sheets or blankets that are too big for your bed. They should not hang down onto the floor. Have a firm chair that has side arms. You can use this for support while you get dressed. Do not have throw rugs and other things on the floor that can make you trip. What can I do in the kitchen? Clean up any spills right away. Avoid walking on wet floors. Keep items that you use a lot in easy-to-reach places. If you need to reach something above you, use a strong step stool that has a grab bar. Keep electrical cords out of the way. Do not use floor polish or wax that makes floors slippery. If you must use wax, use non-skid floor wax. Do not have throw rugs and other things on the floor that can make you trip. What can I do with my stairs? Do not leave any items on the stairs. Make sure that there are handrails on both sides of the stairs and use them. Fix handrails that are broken or loose. Make  sure that handrails are as long as the stairways. Check any carpeting to make sure that it is firmly attached to the stairs. Fix any carpet that is loose or worn. Avoid having throw rugs at the top or bottom of the stairs. If you do have throw rugs, attach them to the floor with carpet tape. Make sure that you have a light switch at the top of the stairs and the bottom of the stairs. If you do not have them, ask someone to add them for you. What else can I do to help prevent falls? Wear shoes that: Do not have high heels. Have rubber bottoms. Are comfortable and fit you well. Are closed at the toe. Do not wear sandals. If you use a stepladder: Make sure that it is fully opened. Do not climb a  closed stepladder. Make sure that both sides of the stepladder are locked into place. Ask someone to hold it for you, if possible. Clearly mark and make sure that you can see: Any grab bars or handrails. First and last steps. Where the edge of each step is. Use tools that help you move around (mobility aids) if they are needed. These include: Canes. Walkers. Scooters. Crutches. Turn on the lights when you go into a dark area. Replace any light bulbs as soon as they burn out. Set up your furniture so you have a clear path. Avoid moving your furniture around. If any of your floors are uneven, fix them. If there are any pets around you, be aware of where they are. Review your medicines with your doctor. Some medicines can make you feel dizzy. This can increase your chance of falling. Ask your doctor what other things that you can do to help prevent falls. This information is not intended to replace advice given to you by your health care provider. Make sure you discuss any questions you have with your health care provider. Document Released: 02/18/2009 Document Revised: 09/30/2015 Document Reviewed: 05/29/2014 Elsevier Interactive Patient Education  2017 [employer].

## 2021-06-04 ENCOUNTER — Other Ambulatory Visit: Payer: Self-pay | Admitting: Family Medicine

## 2021-06-08 ENCOUNTER — Other Ambulatory Visit: Payer: Self-pay | Admitting: Family Medicine

## 2021-06-24 ENCOUNTER — Other Ambulatory Visit: Payer: Self-pay | Admitting: Family Medicine

## 2021-06-24 MED ORDER — AMLODIPINE BESYLATE 10 MG PO TABS: 10.0000 mg | ORAL_TABLET | Freq: Every day | ORAL | 0 refills | Status: DC

## 2021-07-12 ENCOUNTER — Other Ambulatory Visit: Payer: Self-pay | Admitting: Family Medicine

## 2021-07-12 MED ORDER — NEBIVOLOL HCL 2.5 MG PO TABS: 2.5000 mg | ORAL_TABLET | Freq: Every day | ORAL | 0 refills | Status: DC

## 2021-07-12 MED ORDER — POTASSIUM CITRATE ER 10 MEQ (1080 MG) PO TBCR: EXTENDED_RELEASE_TABLET | ORAL | 0 refills | Status: DC

## 2021-09-21 ENCOUNTER — Encounter: Payer: Self-pay | Admitting: Family Medicine

## 2021-09-28 ENCOUNTER — Other Ambulatory Visit: Payer: Self-pay

## 2021-09-28 ENCOUNTER — Encounter: Payer: Self-pay | Admitting: Family Medicine

## 2021-09-28 MED ORDER — AMLODIPINE BESYLATE 5 MG PO TABS: 5.0000 mg | ORAL_TABLET | Freq: Every day | ORAL | 3 refills | Status: DC

## 2021-09-28 MED ORDER — AZELASTINE HCL 0.1 % NA SOLN: NASAL | 0 refills | Status: DC

## 2021-10-14 ENCOUNTER — Other Ambulatory Visit: Payer: Self-pay | Admitting: Family Medicine

## 2021-10-14 MED ORDER — POTASSIUM CITRATE ER 10 MEQ (1080 MG) PO TBCR: EXTENDED_RELEASE_TABLET | ORAL | 0 refills | Status: DC

## 2021-10-14 MED ORDER — NEBIVOLOL HCL 2.5 MG PO TABS: 2.5000 mg | ORAL_TABLET | Freq: Every day | ORAL | 0 refills | Status: DC

## 2021-10-18 ENCOUNTER — Other Ambulatory Visit: Payer: Self-pay | Admitting: Family Medicine

## 2021-10-18 DIAGNOSIS — Z1231 Encounter for screening mammogram for malignant neoplasm of breast: Secondary | ICD-10-CM

## 2021-10-25 DIAGNOSIS — H40023 Open angle with borderline findings, high risk, bilateral: Secondary | ICD-10-CM | POA: Diagnosis not present

## 2021-11-22 ENCOUNTER — Other Ambulatory Visit: Payer: Self-pay | Admitting: Family Medicine

## 2021-11-23 ENCOUNTER — Encounter: Payer: Self-pay | Admitting: Family Medicine

## 2021-11-24 NOTE — Telephone Encounter (Signed)
FYI--I was able to schedule patient on 07/31 @ 8 am for 6 month f/u from 06/22

## 2021-11-25 ENCOUNTER — Other Ambulatory Visit: Payer: Self-pay | Admitting: Family Medicine

## 2021-12-02 ENCOUNTER — Ambulatory Visit
Admission: RE | Admit: 2021-12-02 | Discharge: 2021-12-02 | Disposition: A | Discharge status: Home / Self Care | Payer: Medicare Other | Source: Ambulatory Visit | Attending: Family Medicine | Admitting: Family Medicine

## 2021-12-02 DIAGNOSIS — Z1231 Encounter for screening mammogram for malignant neoplasm of breast: Secondary | ICD-10-CM | POA: Diagnosis not present

## 2021-12-05 ENCOUNTER — Ambulatory Visit (INDEPENDENT_AMBULATORY_CARE_PROVIDER_SITE_OTHER): Payer: Medicare Other | Admitting: Family Medicine

## 2021-12-05 ENCOUNTER — Encounter: Payer: Self-pay | Admitting: Family Medicine

## 2021-12-05 VITALS — BP 120/70 | HR 62 | Temp 97.9°F | Ht 61.0 in | Wt 150.4 lb

## 2021-12-05 DIAGNOSIS — R739 Hyperglycemia, unspecified: Secondary | ICD-10-CM | POA: Diagnosis not present

## 2021-12-05 DIAGNOSIS — E559 Vitamin D deficiency, unspecified: Secondary | ICD-10-CM

## 2021-12-05 DIAGNOSIS — E785 Hyperlipidemia, unspecified: Secondary | ICD-10-CM | POA: Diagnosis not present

## 2021-12-05 DIAGNOSIS — E8881 Metabolic syndrome: Secondary | ICD-10-CM | POA: Diagnosis not present

## 2021-12-05 DIAGNOSIS — E88819 Insulin resistance, unspecified: Secondary | ICD-10-CM

## 2021-12-05 DIAGNOSIS — M859 Disorder of bone density and structure, unspecified: Secondary | ICD-10-CM

## 2021-12-05 DIAGNOSIS — Z780 Asymptomatic menopausal state: Secondary | ICD-10-CM

## 2021-12-05 LAB — CBC WITH DIFFERENTIAL/PLATELET
Basophils Absolute: 0 10*3/uL (ref 0.0–0.1)
Basophils Relative: 0.7 % (ref 0.0–3.0)
Eosinophils Absolute: 0.1 10*3/uL (ref 0.0–0.7)
Eosinophils Relative: 2.2 % (ref 0.0–5.0)
HCT: 42.9 % (ref 36.0–46.0)
Hemoglobin: 14.3 g/dL (ref 12.0–15.0)
Lymphocytes Relative: 43.1 % (ref 12.0–46.0)
Lymphs Abs: 2.2 10*3/uL (ref 0.7–4.0)
MCHC: 33.3 g/dL (ref 30.0–36.0)
MCV: 92.2 fl (ref 78.0–100.0)
Monocytes Absolute: 0.4 10*3/uL (ref 0.1–1.0)
Monocytes Relative: 7.2 % (ref 3.0–12.0)
Neutro Abs: 2.4 10*3/uL (ref 1.4–7.7)
Neutrophils Relative %: 46.8 % (ref 43.0–77.0)
Platelets: 202 10*3/uL (ref 150.0–400.0)
RBC: 4.65 Mil/uL (ref 3.87–5.11)
RDW: 13.2 % (ref 11.5–15.5)
WBC: 5.1 10*3/uL (ref 4.0–10.5)

## 2021-12-05 LAB — COMPREHENSIVE METABOLIC PANEL
ALT: 25 U/L (ref 0–35)
AST: 20 U/L (ref 0–37)
Albumin: 4.8 g/dL (ref 3.5–5.2)
Alkaline Phosphatase: 58 U/L (ref 39–117)
BUN: 23 mg/dL (ref 6–23)
CO2: 29 mEq/L (ref 19–32)
Calcium: 10.4 mg/dL (ref 8.4–10.5)
Chloride: 103 mEq/L (ref 96–112)
Creatinine, Ser: 0.88 mg/dL (ref 0.40–1.20)
GFR: 65.56 mL/min (ref >60.00)
Glucose, Bld: 113 mg/dL — ABNORMAL HIGH (ref 70–99)
Potassium: 4.9 mEq/L (ref 3.5–5.1)
Sodium: 143 mEq/L (ref 135–145)
Total Bilirubin: 0.5 mg/dL (ref 0.2–1.2)
Total Protein: 8.1 g/dL (ref 6.0–8.3)

## 2021-12-05 LAB — VITAMIN D 25 HYDROXY (VIT D DEFICIENCY, FRACTURES): VITD: 53.23 ng/mL (ref 30.00–100.00)

## 2021-12-05 LAB — LIPID PANEL
Cholesterol: 165 mg/dL (ref 0–200)
HDL: 62.5 mg/dL (ref >39.00)
LDL Cholesterol: 90 mg/dL (ref 0–99)
NonHDL: 102.96
Total CHOL/HDL Ratio: 3
Triglycerides: 67 mg/dL (ref 0.0–149.0)
VLDL: 13.4 mg/dL (ref 0.0–40.0)

## 2021-12-05 LAB — HEMOGLOBIN A1C: Hgb A1c MFr Bld: 6.1 % (ref 4.6–6.5)

## 2021-12-05 NOTE — Progress Notes (Signed)
Phone [phone] In person visit   Subjective:   Rhonda Spence is a 71 y.o. year old very pleasant female patient who presents for/with See problem oriented charting Chief Complaint  Patient presents with   Follow-up   Hypertension   Hyperlipidemia   Dizziness    Pt c/o dizziness when bending over and standing up while working in garden as well as in the house at times. She denies chest pain and SOB.    Past Medical History-  Patient Active Problem List   Diagnosis Date Noted   Vitamin D deficiency 06/08/2018    Priority: Medium    Migraine with aura 01/11/2018    Priority: Medium    Insulin resistance 01/11/2018    Priority: Medium    Glaucoma 02/02/2016    Priority: Medium    Asthma, cough variant 02/02/2016    Priority: Medium    Obstructive sleep apnea of adult 02/02/2016    Priority: Medium    Hyperlipidemia, unspecified 02/12/2006    Priority: Medium    Essential hypertension 02/12/2006    Priority: Medium    Brittle nails 02/08/2018    Priority: Low   History of adenomatous polyp of colon 01/11/2018    Priority: Low   Hypertrophy of nasal turbinates 02/01/2017    Priority: Low   Allergic rhinitis 09/28/2016    Priority: Low   Bilateral temporomandibular joint pain 09/28/2016    Priority: Low   Seasonal and perennial allergic rhinitis 04/25/2016    Priority: Low   Age-related nuclear cataract of both eyes 01/12/2015    Priority: Low   NEPHROLITHIASIS, HX OF 02/12/2006    Priority: Low   Low bone density 12/05/2021    Medications- reviewed and updated Current Outpatient Medications  Medication Sig Dispense Refill   acetaminophen (TYLENOL) 650 MG CR tablet Take 650 mg by mouth every 8 (eight) hours as needed for pain. As needed     amLODipine (NORVASC) 5 MG tablet Take 1 tablet (5 mg total) by mouth daily. 90 tablet 3   azelastine (ASTELIN) 0.1 % nasal spray Use in each nostril as directed 90 mL 0   calcium carbonate (OS-CAL) 600 MG TABS  tablet Take 600 mg by mouth daily with breakfast.     chlorthalidone (HYGROTON) 25 MG tablet Take 1 tablet by mouth once daily 90 tablet 0   Cholecalciferol (VITAMIN D) 125 MCG (5000 UT) CAPS Take 5,000 Units by mouth daily.      Cholecalciferol (VITAMIN D3 PO) Vitamin D3     Coenzyme Q10 100 MG capsule Take by mouth.     latanoprost (XALATAN) 0.005 % ophthalmic solution Place 1 drop into both eyes at bedtime.     losartan (COZAAR) 100 MG tablet Take 1 tablet by mouth once daily 90 tablet 0   montelukast (SINGULAIR) 10 MG tablet Take 1 tablet by mouth once daily with breakfast 90 tablet 0   potassium citrate (UROCIT-K) 10 MEQ (1080 MG) SR tablet TAKE 1  BY MOUTH TWICE DAILY 180 tablet 0   psyllium (METAMUCIL) 58.6 % packet Take 1 packet by mouth daily.     rosuvastatin (CRESTOR) 40 MG tablet TAKE 1 TABLET BY MOUTH TWICE A WEEK 8 tablet 0   VENTOLIN HFA 108 (90 Base) MCG/ACT inhaler Inhale 2 puffs into the lungs every 4 (four) hours as needed for wheezing or shortness of breath. 18 g 3   No current facility-administered medications for this visit.     Objective:  BP 120/70  Pulse 62   Temp 97.9 F (36.6 C)   Ht '5\' 1"'$  (1.549 m)   Wt 150 lb 6.4 oz (68.2 kg)   SpO2 97%   BMI 28.42 kg/m  Gen: NAD, resting comfortably CV: RRR no murmurs rubs or gallops Lungs: CTAB no crackles, wheeze, rhonchi Abdomen: soft/nontender/nondistended/normal bowel sounds. No rebound or guarding.  Ext: no edema Skin: warm, dry Neuro: grossly normal, moves all extremities     Assessment and Plan   #hypertension S: medication: amlodipine 5 mg (edema on '10mg'$ ), chlorthalidone 25 mg, losartan '100mg'$ , nebivolol 2.5 mg -Pt c/o dizziness when bending over and standing up while working in garden as well as in the house at times. She denies chest pain and SOB. Sometimes even has to hold onto a tree can get so bad. Almost feels like she may pass out. Only hour at a time and tries to hydrate Home readings #s: usually  getting 120s/70s or so.  BP Readings from Last 3 Encounters:  12/05/21 120/70  10/14/20 120/70  12/22/19 114/62  A/P: blood pressure well controlled but having some orthostatic symptoms (lightheadedness with position change) we opted to trial off nebviolol- she is excellent about monitoring at home and keeping me informed- asked her to give this about 2 weeks then update me with pressures.   #hyperlipidemia- peak LDL 177 S: Medication: rosuvastatin 40 mg twice a week, also on coq10 Lab Results  Component Value Date   CHOL 200 (H) 01/01/2020   HDL 51 01/01/2020   LDLCALC 123 (H) 01/01/2020   LDLDIRECT 102.0 10/14/2020   TRIG 149 01/01/2020   CHOLHDL 3.9 01/01/2020   A/P: hopefully stable- update lipid panel today. Continue current meds for now  # Hyperglycemia/insulin resistance/prediabetes S:  Medication: none Exercise and diet- staying active gardening regularly, watching carbs, watching labels- down 2 lbs from last year.  Lab Results  Component Value Date   HGBA1C 5.9 10/14/2020   HGBA1C 5.7 (H) 01/01/2020   HGBA1C 5.6 09/04/2017   A/P: hopefully stable or improved- update a1c today. We did discuss potential links between statin and DM but feel benefits outweigh risks  #Vitamin D deficiency S: Medication: 5400 IU each morning Last vitamin D Lab Results  Component Value Date   VD25OH 37 01/01/2020  A/P: hopefully stable- update vitamin D today. Continue current meds for now   #Asthma/allergies- does well with singulair and astelin combo- rarely needs albuterol- only with covid did she need this  HM Recommended covid 19 vaccination update likely in the fall when new version released. Last covid sept 22- was in [place] ordered - she just had this done- we will await results Colonoscopy due 2024- last done 2019 DEXA 2018 different machine- offered update - she agrees  Recommended follow up: Return in about 6 months (around 06/07/2022) for followup or sooner if  needed.Schedule b4 you leave. Future Appointments  Date Time Provider [place]  05/29/2022 11:00 AM [facility]    Lab/Order associations: fasting   ICD-10-CM   1. Hyperglycemia  R73.9 HgB A1c    2. Hyperlipidemia, unspecified hyperlipidemia type  E78.5 CBC with Differential/Platelet    Comprehensive metabolic panel    Lipid panel    3. Vitamin D deficiency  E55.9 VITAMIN D 25 Hydroxy (Vit-D Deficiency, Fractures)    4. Insulin resistance  E88.81 HgB A1c    5. Low bone density  M85.9 DG Bone Density    6. Postmenopausal  Z78.0 DG Bone Density  No orders of the defined types were placed in this encounter.   Return precautions advised.  Garret Reddish, MD

## 2021-12-05 NOTE — Patient Instructions (Addendum)
blood pressure well controlled but having some orthostatic symptoms (lightheadedness with position change) we opted to trial off nebviolol- she is excellent about monitoring at home and keeping me informed- asked her to give this about 2 weeks then update me with pressures.   Schedule your bone density test at check out desk.  - located [address] across the [address] from [place] - in the basement - you DO NEED an appointment for the bone density tests.   fasting  Recommended follow up: Return in about 6 months (around 06/07/2022) for followup or sooner if needed.Schedule b4 you leave.

## 2021-12-12 ENCOUNTER — Ambulatory Visit (INDEPENDENT_AMBULATORY_CARE_PROVIDER_SITE_OTHER)
Admission: RE | Admit: 2021-12-12 | Discharge: 2021-12-12 | Disposition: A | Discharge status: Home / Self Care | Payer: Medicare Other | Source: Ambulatory Visit | Attending: Family Medicine | Admitting: Family Medicine

## 2021-12-12 ENCOUNTER — Other Ambulatory Visit: Payer: Self-pay | Admitting: Family Medicine

## 2021-12-12 ENCOUNTER — Encounter: Payer: Self-pay | Admitting: Family Medicine

## 2021-12-12 DIAGNOSIS — Z780 Asymptomatic menopausal state: Secondary | ICD-10-CM

## 2021-12-12 DIAGNOSIS — M859 Disorder of bone density and structure, unspecified: Secondary | ICD-10-CM

## 2021-12-15 ENCOUNTER — Ambulatory Visit (INDEPENDENT_AMBULATORY_CARE_PROVIDER_SITE_OTHER): Payer: Medicare Other | Admitting: Family Medicine

## 2021-12-15 ENCOUNTER — Encounter: Payer: Self-pay | Admitting: Family Medicine

## 2021-12-15 VITALS — BP 112/62 | HR 100 | Temp 98.2°F | Ht 61.0 in | Wt 150.2 lb

## 2021-12-15 DIAGNOSIS — I10 Essential (primary) hypertension: Secondary | ICD-10-CM

## 2021-12-15 DIAGNOSIS — M859 Disorder of bone density and structure, unspecified: Secondary | ICD-10-CM

## 2021-12-15 DIAGNOSIS — E559 Vitamin D deficiency, unspecified: Secondary | ICD-10-CM | POA: Diagnosis not present

## 2021-12-15 NOTE — Progress Notes (Signed)
Phone [phone] In person visit   Subjective:   Rhonda Spence is a 71 y.o. year old very pleasant female patient who presents for/with See problem oriented charting Chief Complaint  Patient presents with   Follow-up    Pt is here to f/u on DEXA.    Past Medical History-  Patient Active Problem List   Diagnosis Date Noted   Low bone density 12/05/2021    Priority: Medium    Vitamin D deficiency 06/08/2018    Priority: Medium    Migraine with aura 01/11/2018    Priority: Medium    Insulin resistance 01/11/2018    Priority: Medium    Glaucoma 02/02/2016    Priority: Medium    Asthma, cough variant 02/02/2016    Priority: Medium    Obstructive sleep apnea of adult 02/02/2016    Priority: Medium    Hyperlipidemia, unspecified 02/12/2006    Priority: Medium    Essential hypertension 02/12/2006    Priority: Medium    Brittle nails 02/08/2018    Priority: Low   History of adenomatous polyp of colon 01/11/2018    Priority: Low   Hypertrophy of nasal turbinates 02/01/2017    Priority: Low   Allergic rhinitis 09/28/2016    Priority: Low   Bilateral temporomandibular joint pain 09/28/2016    Priority: Low   Seasonal and perennial allergic rhinitis 04/25/2016    Priority: Low   Age-related nuclear cataract of both eyes 01/12/2015    Priority: Low   NEPHROLITHIASIS, HX OF 02/12/2006    Priority: Low    Medications- reviewed and updated Current Outpatient Medications  Medication Sig Dispense Refill   acetaminophen (TYLENOL) 650 MG CR tablet Take 650 mg by mouth every 8 (eight) hours as needed for pain. As needed     amLODipine (NORVASC) 5 MG tablet Take 1 tablet (5 mg total) by mouth daily. 90 tablet 3   azelastine (ASTELIN) 0.1 % nasal spray Use in each nostril as directed 90 mL 0   calcium carbonate (OS-CAL) 600 MG TABS tablet Take 600 mg by mouth daily with breakfast.     chlorthalidone (HYGROTON) 25 MG tablet Take 1 tablet by mouth once daily 90 tablet 0    Cholecalciferol (VITAMIN D3 PO) Vitamin D3     Coenzyme Q10 100 MG capsule Take by mouth.     latanoprost (XALATAN) 0.005 % ophthalmic solution Place 1 drop into both eyes at bedtime.     losartan (COZAAR) 100 MG tablet Take 1 tablet by mouth once daily 90 tablet 0   montelukast (SINGULAIR) 10 MG tablet Take 1 tablet by mouth once daily with breakfast 90 tablet 0   potassium citrate (UROCIT-K) 10 MEQ (1080 MG) SR tablet TAKE 1  BY MOUTH TWICE DAILY 180 tablet 0   psyllium (METAMUCIL) 58.6 % packet Take 1 packet by mouth daily.     rosuvastatin (CRESTOR) 40 MG tablet TAKE 1 TABLET BY MOUTH TWICE A WEEK 8 tablet 0   VENTOLIN HFA 108 (90 Base) MCG/ACT inhaler Inhale 2 puffs into the lungs every 4 (four) hours as needed for wheezing or shortness of breath. 18 g 3   Cholecalciferol (VITAMIN D) 125 MCG (5000 UT) CAPS Take 5,000 Units by mouth daily.      No current facility-administered medications for this visit.     Objective:  BP 112/62   Pulse 100   Temp 98.2 F (36.8 C)   Ht '5\' 1"'$  (1.549 m)   Wt 150 lb 3.2 oz (  68.1 kg)   SpO2 97%   BMI 28.38 kg/m  Gen: NAD, resting comfortably CV: RRR no murmurs rubs or gallops Ext: no edema     Assessment and Plan   # Low Bone density (formerly osteopenia) S: Last DEXA: 12/12/2021 with worst T-score -2.0 on left femoral neck with 10-year risk of major osteoporotic fracture at 18.9% and 10-year risk of hip fracture at 3.9%  Calcium: '1200mg'$  (through diet ok) recommended - had been doing 600 but is going to focus on '600mg'$  in food plus staying on '600mg'$  supplement Vitamin D: 1000 units a day recommended- doing 5000 IU- vitamin D looked good  Has not done the best with weight bearing exercise in the last year- hard with her A/P: Low bone density but with elevated FRAX score 3.9% for hip fracture discussed option of starting medication such as Fosamax or Prolia.  Discussed side effect profile of medications-she would like to repeat in 2 years and make  sure to regularly get calcium, vitamin D, weightbearing exercise and reconsider medication if any worsening - She is low fall risk overall which lowers the risk -Vitamin D deficiency is adequately treated-continue current medication  #hypertension S: medication: Amlodipine 5 mg, chlorthalidone 25 mg, losartan 100 mg -no dizziness since last visit but has not had chance to be out in the yard-we decided to hold nebivolol due to orthostatic symptoms with position changes-particular yard work Home readings #s: Slightly elevated at home at times into the 150s but states thinks she needs to update her batteries-cuff is pumping slowly.  Heart rate at home max in the 80s BP Readings from Last 3 Encounters:  12/15/21 112/62  12/05/21 120/70  10/14/20 120/70  A/P: Good control in office though heart rate slightly higher (was below 100 on my check)-continue to hold off on nebivolol and continue amlodipine and chlorthalidone and losartan at doses listed above -She is going to update batteries in her machine-hopeful for improvement on home reads  #right elbow pain- plans to see Dr. Nell Range to have some fullness on the medial malleolus-question lipoma and may benefit from ultrasound  Recommended follow up: Return for next already scheduled visit or sooner if needed. Future Appointments  Date Time Provider [place]  05/29/2022 11:00 AM [facility]  06/07/2022  9:20 AM [place] Brayton Mars, MD [facility]    Lab/Order associations:   ICD-10-CM   1. Essential hypertension  I10     2. Low bone density  M85.9       No orders of the defined types were placed in this encounter.   Return precautions advised.  Garret Reddish, MD

## 2021-12-15 NOTE — Assessment & Plan Note (Signed)
S: medication: Amlodipine 5 mg, chlorthalidone 25 mg, losartan 100 mg -no dizziness since last visit but has not had chance to be out in the yard-we decided to hold nebivolol due to orthostatic symptoms with position changes-particular yard work Home readings #s: Slightly elevated at home at times into the 150s but states thinks she needs to update her batteries-cuff is pumping slowly.  Heart rate at home max in the 80s BP Readings from Last 3 Encounters:  12/15/21 112/62  12/05/21 120/70  10/14/20 120/70  A/P: Good control in office though heart rate slightly higher (was below 100 on my check)-continue to hold off on nebivolol and continue amlodipine and chlorthalidone and losartan at doses listed above

## 2021-12-15 NOTE — Patient Instructions (Addendum)
Flu shot- we should have these available within a month or two but please let us know if you get at outside pharmacy  Daily calcium goal '1200mg'$ - combo of pills and diet is fine- I prefer larger portion through diet if possible  Continue vitamin D  Goal 150 mins per week weight bearing exercise  Recommended follow up: Return for next already scheduled visit or sooner if needed.

## 2021-12-23 ENCOUNTER — Encounter: Payer: Self-pay | Admitting: Family Medicine

## 2022-01-13 ENCOUNTER — Other Ambulatory Visit: Payer: Self-pay | Admitting: Family Medicine

## 2022-01-14 DIAGNOSIS — Z23 Encounter for immunization: Secondary | ICD-10-CM | POA: Diagnosis not present

## 2022-01-25 ENCOUNTER — Other Ambulatory Visit: Payer: Self-pay | Admitting: Family Medicine

## 2022-01-30 ENCOUNTER — Encounter: Payer: Self-pay | Admitting: *Deleted

## 2022-02-06 DIAGNOSIS — Z23 Encounter for immunization: Secondary | ICD-10-CM | POA: Diagnosis not present

## 2022-02-16 ENCOUNTER — Other Ambulatory Visit: Payer: Self-pay

## 2022-02-16 ENCOUNTER — Other Ambulatory Visit: Payer: Self-pay | Admitting: Family Medicine

## 2022-02-16 MED ORDER — ROSUVASTATIN CALCIUM 40 MG PO TABS: ORAL_TABLET | ORAL | 0 refills | Status: DC

## 2022-02-16 MED ORDER — ROSUVASTATIN CALCIUM 40 MG PO TABS: ORAL_TABLET | ORAL | 3 refills | Status: DC

## 2022-02-20 ENCOUNTER — Other Ambulatory Visit: Payer: Self-pay | Admitting: Family Medicine

## 2022-02-27 ENCOUNTER — Other Ambulatory Visit: Payer: Self-pay | Admitting: Family Medicine

## 2022-02-28 ENCOUNTER — Other Ambulatory Visit: Payer: Self-pay | Admitting: Family Medicine

## 2022-03-20 ENCOUNTER — Ambulatory Visit (INDEPENDENT_AMBULATORY_CARE_PROVIDER_SITE_OTHER): Payer: Medicare Other | Admitting: Family Medicine

## 2022-03-20 ENCOUNTER — Telehealth: Payer: Self-pay | Admitting: Family Medicine

## 2022-03-20 ENCOUNTER — Encounter: Payer: Self-pay | Admitting: Family Medicine

## 2022-03-20 VITALS — BP 142/78 | HR 81 | Temp 97.8°F | Ht 61.0 in | Wt 149.2 lb

## 2022-03-20 DIAGNOSIS — E785 Hyperlipidemia, unspecified: Secondary | ICD-10-CM

## 2022-03-20 DIAGNOSIS — R4789 Other speech disturbances: Secondary | ICD-10-CM | POA: Diagnosis not present

## 2022-03-20 DIAGNOSIS — I10 Essential (primary) hypertension: Secondary | ICD-10-CM | POA: Diagnosis not present

## 2022-03-20 DIAGNOSIS — G43109 Migraine with aura, not intractable, without status migrainosus: Secondary | ICD-10-CM | POA: Diagnosis not present

## 2022-03-20 DIAGNOSIS — R519 Headache, unspecified: Secondary | ICD-10-CM | POA: Diagnosis not present

## 2022-03-20 NOTE — Telephone Encounter (Signed)
Patient Name: Rhonda Spence [facility] Gender: Female DOB: [birth year] Age: 71 Y 55 M 2 D Return Phone Number: [phone] (Primary), [phone] (Secondary) Address: City/ State/ Zip: [place]  [address] [facility] [place] [facility] [place] [facility] Provider Garret Reddish- MD Contact Type Call Who Is Calling Patient / Member / Family / Caregiver Call Type Triage / Clinical Caller Name Raquel Sarna Relationship To Patient Other Return Phone Number [phone] (Primary) Chief Complaint SPEAK - sudden inability to talk or slurred speech Reason for Call Symptomatic / Request for [name] states she has a patient that is having trouble putting words together and speaking. Translation No Nurse Assessment Nurse: Tito Dine, RN, Neoma Laming Date/Time Eilene Ghazi Time): 03/20/2022 11:09:32 AM Confirm and document reason for call. If symptomatic, describe symptoms. ---Caller states she has a patient that is having trouble putting words together and speaking. Pt is also having symptoms of headache. Does the patient have any new or worsening symptoms? ---Yes Will a triage be completed? ---Yes Related visit to physician within the last 2 weeks? ---No Does the PT have any chronic conditions? (i.e. diabetes, asthma, this includes High risk factors for pregnancy, etc.) ---Yes List chronic conditions. ---HTN HLD Glaucoma Is this a behavioral health or substance abuse call? ---No Guidelines Guideline Title Affirmed Question Affirmed Notes Nurse Date/Time (Eastern Time) Neurologic Deficit [1] Loss of speech or garbled speech AND [2] sudden onset AND [3] present now Tito Dine, RN, Neoma Laming 03/20/2022 11:11:13 AM PLEASE NOTE: All timestamps contained within this report are represented as [place] Standard Time. CONFIDENTIALTY NOTICE: This fax transmission is intended only for the  addressee. It contains information that is legally privileged, confidential or otherwise protected from use or disclosure. If you are not the intended recipient, you are strictly prohibited from reviewing, disclosing, copying using or disseminating any of this information or taking any action in reliance on or regarding this information. If you have received this fax in error, please notify us immediately by telephone so that we can arrange for its return to [place]. Phone: [phone], Toll-Free: [phone], Fax: [phone] Page: 2 of 2 Call Id: [id number] [name]. Time Eilene Ghazi Time) Disposition Final User 03/20/2022 11:07:35 AM Send to Urgent Bethena Roys 03/20/2022 11:12:35 AM Call EMS 911 Now Yes Tito Dine, RN, Deborah 03/20/2022 11:17:47 AM 911 Outcome Documentation Tito Dine, RN, Neoma Laming Reason: Called back, unable to reach caller - Voicemal Final Disposition 03/20/2022 11:12:35 AM Call EMS 911 Now Yes Tito Dine, RN, Garrel Ridgel Disagree/Comply Comply Caller Understands Yes PreDisposition Call Doctor Care Advice Given Per Guideline * Immediate medical attention is needed. You need to hang up and call 911 (or an ambulance). * Triager Discretion: I'll call you back in a few minutes to be sure you were able to reach them. CARE ADVICE given per Neurologic Deficit (Adult) guideline. CALL EMS 911 NOW:

## 2022-03-20 NOTE — Telephone Encounter (Signed)
Caller States: -Pt having trouble speaking and putting words together.  -Just started happening. -Is with patient   Caller warm transferred to PCP Triage Nurse. Warm Transferred to Elmo Putt, patient coordinator with [facility].

## 2022-03-20 NOTE — Telephone Encounter (Signed)
Patient presented to front desk with her husband.   Patient states: -She called EMS as instructed by triage nurse in message below.  - EMT informed her that sx could be a TIA or an expanded migraine  - EMT advised her to go to PCP office and see if she could get an appointment  - Her speech has returned   I informed provider of information above. PCP stated he can work patient in sometime this afternoon. Stated if patient's symptoms worsen while waiting to inform staff. I informed patient of provider's instructions and she verbalized understanding. Currently waiting in waiting area.

## 2022-03-20 NOTE — Patient Instructions (Addendum)
EKG reassuring  We will call you within two weeks about your referral to neurology. If you do not hear within 2 weeks, give [place] a call.   If you have recurrent/worsening symptoms please seek care in emergency department  We strongly suspect related to your migraine history- but some risk of TIA/ministroke- discussed MRI of head and neck, echocardiogram, carotid ultrasound but you wanted to hold off on this workup until you saw neurology  Recommended follow up: Return in about 1 month (around 04/19/2022) for followup or sooner if needed.Schedule b4 you leave. Ideally see neurology before seeing me. I likely will try to push blood pressure down further with medicine if not better in next month- please bring log in of readings.

## 2022-03-20 NOTE — Progress Notes (Signed)
Phone [phone] In person visit   Subjective:   Rhonda Spence is a 71 y.o. year old very pleasant female patient who presents for/with See problem oriented charting Chief Complaint  Patient presents with   Acute Visit    Pt c/o she could not think of words to form sentences / headache all lasted for 30 minutes.   Past Medical History-  Patient Active Problem List   Diagnosis Date Noted   Low bone density 12/05/2021    Priority: Medium    Vitamin D deficiency 06/08/2018    Priority: Medium    Migraine with aura 01/11/2018    Priority: Medium    Insulin resistance 01/11/2018    Priority: Medium    Glaucoma 02/02/2016    Priority: Medium    Asthma, cough variant 02/02/2016    Priority: Medium    Obstructive sleep apnea of adult 02/02/2016    Priority: Medium    Hyperlipidemia, unspecified 02/12/2006    Priority: Medium    Essential hypertension 02/12/2006    Priority: Medium    Brittle nails 02/08/2018    Priority: Low   History of adenomatous polyp of colon 01/11/2018    Priority: Low   Hypertrophy of nasal turbinates 02/01/2017    Priority: Low   Allergic rhinitis 09/28/2016    Priority: Low   Bilateral temporomandibular joint pain 09/28/2016    Priority: Low   Seasonal and perennial allergic rhinitis 04/25/2016    Priority: Low   Age-related nuclear cataract of both eyes 01/12/2015    Priority: Low   NEPHROLITHIASIS, HX OF 02/12/2006    Priority: Low    Medications- reviewed and updated Current Outpatient Medications  Medication Sig Dispense Refill   acetaminophen (TYLENOL) 650 MG CR tablet Take 650 mg by mouth every 8 (eight) hours as needed for pain. As needed     amLODipine (NORVASC) 5 MG tablet Take 1 tablet (5 mg total) by mouth daily. 90 tablet 3   azelastine (ASTELIN) 0.1 % nasal spray Use in each nostril as directed 90 mL 0   calcium carbonate (OS-CAL) 600 MG TABS tablet Take 600 mg by mouth daily with breakfast.     chlorthalidone  (HYGROTON) 25 MG tablet Take 1 tablet by mouth once daily 90 tablet 0   Coenzyme Q10 100 MG capsule Take by mouth.     latanoprost (XALATAN) 0.005 % ophthalmic solution Place 1 drop into both eyes at bedtime.     losartan (COZAAR) 100 MG tablet Take 1 tablet by mouth once daily 90 tablet 0   montelukast (SINGULAIR) 10 MG tablet Take 1 tablet by mouth once daily with breakfast 90 tablet 0   potassium citrate (UROCIT-K) 10 MEQ (1080 MG) SR tablet Take 1 tablet by mouth twice daily 180 tablet 0   psyllium (METAMUCIL) 58.6 % packet Take 1 packet by mouth daily.     rosuvastatin (CRESTOR) 40 MG tablet TAKE 1 TABLET BY MOUTH TWICE A WEEK 26 tablet 3   VENTOLIN HFA 108 (90 Base) MCG/ACT inhaler Inhale 2 puffs into the lungs every 4 (four) hours as needed for wheezing or shortness of breath. 18 g 3   Cholecalciferol (VITAMIN D) 125 MCG (5000 UT) CAPS Take 5,000 Units by mouth daily.      No current facility-administered medications for this visit.     Objective:  BP (!) 142/78   Pulse 81   Temp 97.8 F (36.6 C) (Temporal)   Ht '5\' 1"'$  (1.549 m)   Wt 149  lb 3.2 oz (67.7 kg)   SpO2 98%   BMI 28.19 kg/m  Gen: NAD, resting comfortably CV: RRR no murmurs rubs or gallops Lungs: CTAB no crackles, wheeze, rhonchi Ext: no edema Skin: warm, dry Neuro: CN II-XII intact, sensation and reflexes normal throughout, 5/5 muscle strength in bilateral upper and lower extremities. Normal finger to nose. Normal rapid alternating movements. No pronator drift. Normal romberg. Normal gait.    EKG: sinus rhythm with rate 79, normal axis, normal intervals, no hypertrophy, no st or t wave changes     Assessment and Plan   # Word finding difficulty/aphasia/vision changes-typical of her aura/headache S: history of migraine with aura- occasionally will have mild aphasia with this period. Typically notes slight central vision glare and usually lasts 15-20 minutes - does not regularly get headaches with this now but  did years ago.   Today  about 20 minutes after leaving to go out of town- noted could not think of words (at 11 am). EMS evaluated her - fireman and ENT- words had returned and didn't have other deficits so they recommended PCP or urgent care follow up- episode lasted about 40 minutes (but did note some glare/glittering in eyes for 20 minutes before the word finding issues started). Headache that shoots down to her left eye was noted that came on after the episode. That has not fully resolved- about 2/10 right now.    A few times in last few months has woken up at night and has trouble catching breath. No shortness of breath outside of this and no chest pain with episode or outside of visit.   ROS-No facial or extremity weakness. No slurred words or trouble swallowing. no blurry vision or double vision- other than typical aura effect. No paresthesias. No confusion in her head though had substantial  word finding difficulties.   A/P: Patient with history of migraine with aura-sounds like potential complicated migraine with aura finding difficulty at times.  Most the time does not get headache so could be considered more ocular migraine.  This was a particularly prolonged episode of about an hour.  I still think most likely complicated migraine-refer to neurology today.  Offered TIA work-up and discussed my reasoning for this but she declines for now and agrees to proceed to emergency room if recurrence -Some lingering headache-recommended Tylenol-until we get neurology's opinion would not want to use triptans or ibuprofen  From AVS "  We strongly suspect related to your migraine history- but some risk of TIA/ministroke- discussed MRI of head and neck, echocardiogram, carotid ultrasound but you wanted to hold off on this workup until you saw neurology  Recommended follow up: Return in about 1 month (around 04/19/2022) for followup or sooner if needed.Schedule b4 you leave. Ideally see neurology before  seeing me. I likely will try to push blood pressure down further with medicine if not better in next month- please bring log in of readings.  "  #hypertension S: medication:  amlodipine 5 mg, chlorthalidone 25 mg, losartan 100 mg Home readings #s: blood pressure slightly higher recently around 140/80 -higher stress with holidays- encouraged her to try to cut back BP Readings from Last 3 Encounters:  03/20/22 (!) 142/78  12/15/21 112/62  12/05/21 120/70  A/P: blood pressure mildly high- we likely need to bring this down in the long run but in short run   #hyperlipidemia S: Medication:rosuvastatin 40 mg twice a week  Lab Results  Component Value Date   CHOL 165 12/05/2021  HDL 62.50 12/05/2021   LDLCALC 90 12/05/2021   LDLDIRECT 102.0 10/14/2020   TRIG 67.0 12/05/2021   CHOLHDL 3 12/05/2021   A/P: Cholesterol has been above goal/poorly controlled particularly if this were a TIA-recommended short-term increasing rosuvastatin to daily from twice a week until she sees neurology-she is going to consider this  Recommended follow up: Return in about 1 month (around 04/19/2022) for followup or sooner if needed.Schedule b4 you leave. Future Appointments  Date Time Provider [place]  04/20/2022  9:20 AM Marin Olp, MD [facility]  05/29/2022 11:00 AM [facility] HEALTH COACH [facility]  06/07/2022  9:20 AM Yong Channel Brayton Mars, MD [facility]    Lab/Order associations:   ICD-10-CM   1. Word finding difficulty  R47.89 EKG 12-Lead    Ambulatory referral to Neurology    2. Acute nonintractable headache, unspecified headache type  R51.9     3. Migraine with aura and without status migrainosus, not intractable  G43.109 Ambulatory referral to Neurology    4. Essential hypertension  I10 EKG 12-Lead    5. Hyperlipidemia, unspecified hyperlipidemia type  E78.5      No orders of the defined types were placed in this encounter.  Return precautions advised.  Garret Reddish,  MD

## 2022-03-28 NOTE — Progress Notes (Signed)
NEUROLOGY CONSULTATION NOTE  Rhonda Spence MRN: [id number] DOB: 1951-03-23  Referring provider: Garret Reddish, MD Primary care provider: Garret Reddish, MD  Reason for consult:  migraine, word-finding difficulty  Assessment/Plan:   I strongly suspect migraine with aura rather than TIA.  These symptoms are typical for her migraines and although symptoms lasted longer than usual, duration was not atypical for migraine aura. I still would be prudent to check imaging of the brain to rule out other potential cause such as stroke.  Will check MRI of brain without contrast.  Further recommendations pending results.     Subjective:  Rhonda Spence is a 71 year old female with HTN, HLD, asthma, cataract, glaucoma, and history of kidney stones who presents for migraines and word-finding difficulty.    She has a history of migraines.  From age 60 to about 71 years old, she had frequent headaches.  Then, they turned into migraine with aura presenting with mild or no headache.  Typically it is a visual aura in which she sees glitter.  On occasion, she has had aura presenting as aphasia, typically difficulty getting words out but once she was unable to understand other people talking to her.  In 1980, while pregnant, she had a migraine aura presenting as aphasia accompanied by numbness.  The auras typically last 15-20 minutes and usually occur once every 3 months.  On occasion, she may get a single paroxysmal stab usually in the left parietal region.    On 11/13, she was riding in a car when she developed one of her visual auras of glitter lasting 15-20 minutes followed by aphasia with difficulty getting words out.  She has mild left parietal stabbing headache as well.  No facial droop, slurred speech, dizziness or unilateral numbness or weakness.  She and her husband pulled over to a local dental office where they called 911.  EMS arrived.  Her symptoms resolved so she did not go to the ED.   Symptoms lasted an hour, which was concerning because it never before lasted that long.     PAST MEDICAL HISTORY: Past Medical History:  Diagnosis Date   Allergy    Anemia    Asthma    Cataract    Chronic kidney disease    stones   GERD (gastroesophageal reflux disease)    Glaucoma    Hyperlipidemia    Hypertension    Nephrolithiasis    has required lithotripsy   Sleep apnea    wears an oral appliance    PAST SURGICAL HISTORY: Past Surgical History:  Procedure Laterality Date   ADENOIDECTOMY     BREAST BIOPSY Left 07/09/2009   benign x2   BREAST BIOPSY     CESAREAN SECTION     x2   COLONOSCOPY     LITHOTRIPSY  2011   kimbrough   POLYPECTOMY     TONSILLECTOMY     widom teeth extraction  1976    MEDICATIONS: Current Outpatient Medications on File Prior to Visit  Medication Sig Dispense Refill   acetaminophen (TYLENOL) 650 MG CR tablet Take 650 mg by mouth every 8 (eight) hours as needed for pain. As needed     amLODipine (NORVASC) 5 MG tablet Take 1 tablet (5 mg total) by mouth daily. 90 tablet 3   azelastine (ASTELIN) 0.1 % nasal spray Use in each nostril as directed 90 mL 0   calcium carbonate (OS-CAL) 600 MG TABS tablet Take 600 mg by mouth daily with breakfast.  chlorthalidone (HYGROTON) 25 MG tablet Take 1 tablet by mouth once daily 90 tablet 0   Coenzyme Q10 100 MG capsule Take by mouth.     latanoprost (XALATAN) 0.005 % ophthalmic solution Place 1 drop into both eyes at bedtime.     losartan (COZAAR) 100 MG tablet Take 1 tablet by mouth once daily 90 tablet 0   montelukast (SINGULAIR) 10 MG tablet Take 1 tablet by mouth once daily with breakfast 90 tablet 0   potassium citrate (UROCIT-K) 10 MEQ (1080 MG) SR tablet Take 1 tablet by mouth twice daily 180 tablet 0   psyllium (METAMUCIL) 58.6 % packet Take 1 packet by mouth daily.     rosuvastatin (CRESTOR) 40 MG tablet TAKE 1 TABLET BY MOUTH TWICE A WEEK 26 tablet 3   VENTOLIN HFA 108 (90 Base) MCG/ACT  inhaler Inhale 2 puffs into the lungs every 4 (four) hours as needed for wheezing or shortness of breath. 18 g 3   No current facility-administered medications on file prior to visit.    ALLERGIES: Allergies  Allergen Reactions   Other Other (See Comments)   Iodine     REACTION: congestion   Lipitor [Atorvastatin Calcium]     felt like like arms would disconnect from body and diffuse weakness    FAMILY HISTORY: Family History  Problem Relation Age of Onset   Hypertension Mother    Heart failure Mother    Asthma Mother    COPD Mother        led to death early 74s   Heart disease Father    Heart failure Father        related to osteogenesis imperfecta. died at 36   Osteogenesis imperfecta Father    Osteogenesis imperfecta Sister        possibly related to this- led to death   Breast cancer Sister        half sister   Stroke Brother    Healthy Brother    Breast cancer Maternal Aunt    Hyperlipidemia Son    Obesity Maternal Grandmother    Hyperlipidemia Paternal Grandmother    Hypertension Paternal Grandmother    Hyperlipidemia Son    Glaucoma Son    Colon cancer Neg Hx    Colon polyps Neg Hx    Esophageal cancer Neg Hx    Stomach cancer Neg Hx    Rectal cancer Neg Hx     Objective:  Blood pressure 112/69, pulse 98, height '5\' 3"'$  (1.6 m), weight 150 lb 6.4 oz (68.2 kg), SpO2 98 %. General: No acute distress.  Patient appears well-groomed.   Head:  Normocephalic/atraumatic Eyes:  fundi examined but not visualized Neck: supple, no paraspinal tenderness, full range of motion Back: No paraspinal tenderness Heart: regular rate and rhythm Lungs: Clear to auscultation bilaterally. Vascular: No carotid bruits. Neurological Exam: Mental status: alert and oriented to person, place, and time, speech fluent and not dysarthric, language intact. Cranial nerves: CN I: not tested CN II: pupils equal, round and reactive to light, visual fields intact CN III, IV, VI:  full  range of motion, no nystagmus, no ptosis CN V: facial sensation intact. CN VII: upper and lower face symmetric CN VIII: hearing intact CN IX, X: gag intact, uvula midline CN XI: sternocleidomastoid and trapezius muscles intact CN XII: tongue midline Bulk & Tone: normal, no fasciculations. Motor:  muscle strength 5/5 throughout Sensation:  Pinprick and vibratory sensation intact. Deep Tendon Reflexes:  2+ throughout,  toes downgoing.  Finger to nose testing:  Without dysmetria.   Heel to shin:  Without dysmetria.   Gait:  Normal station and stride.  Romberg negative.    Thank you for allowing me to take part in the care of this patient.  Metta Clines, DO  CC: Garret Reddish, MD

## 2022-03-29 ENCOUNTER — Ambulatory Visit (INDEPENDENT_AMBULATORY_CARE_PROVIDER_SITE_OTHER): Payer: Medicare Other | Admitting: Neurology

## 2022-03-29 ENCOUNTER — Encounter: Payer: Self-pay | Admitting: Neurology

## 2022-03-29 VITALS — BP 112/69 | HR 98 | Ht 63.0 in | Wt 150.4 lb

## 2022-03-29 DIAGNOSIS — R4701 Aphasia: Secondary | ICD-10-CM

## 2022-03-29 DIAGNOSIS — G43109 Migraine with aura, not intractable, without status migrainosus: Secondary | ICD-10-CM | POA: Diagnosis not present

## 2022-03-29 NOTE — Patient Instructions (Signed)
I believe this was a migraine.  But we will check an MRI of brain to make sure nothing else is going on.  Further recommendations pending results.

## 2022-04-04 ENCOUNTER — Encounter: Payer: Self-pay | Admitting: Family Medicine

## 2022-04-04 ENCOUNTER — Other Ambulatory Visit: Payer: Self-pay | Admitting: Family Medicine

## 2022-04-05 ENCOUNTER — Telehealth: Payer: Self-pay | Admitting: Neurology

## 2022-04-05 NOTE — Telephone Encounter (Signed)
[facility] imaging number given to patient.

## 2022-04-05 NOTE — Telephone Encounter (Signed)
Patient left message for patient saw Dr Tomi Likens last wed and has not heard anything from anyone to getting the MRI sch

## 2022-04-06 ENCOUNTER — Other Ambulatory Visit: Payer: Self-pay

## 2022-04-06 MED ORDER — ROSUVASTATIN CALCIUM 40 MG PO TABS: ORAL_TABLET | ORAL | 3 refills | Status: DC

## 2022-04-07 ENCOUNTER — Other Ambulatory Visit: Payer: Self-pay

## 2022-04-07 ENCOUNTER — Telehealth: Payer: Self-pay | Admitting: Family Medicine

## 2022-04-07 MED ORDER — ROSUVASTATIN CALCIUM 40 MG PO TABS: 40.0000 mg | ORAL_TABLET | Freq: Every day | ORAL | 3 refills | Status: DC

## 2022-04-14 ENCOUNTER — Ambulatory Visit
Admission: RE | Admit: 2022-04-14 | Discharge: 2022-04-14 | Disposition: A | Discharge status: Home / Self Care | Payer: Medicare Other | Source: Ambulatory Visit | Attending: Neurology | Admitting: Neurology

## 2022-04-14 ENCOUNTER — Other Ambulatory Visit: Payer: Self-pay | Admitting: Family Medicine

## 2022-04-14 ENCOUNTER — Telehealth: Payer: Self-pay | Admitting: Family Medicine

## 2022-04-14 ENCOUNTER — Other Ambulatory Visit: Payer: Self-pay

## 2022-04-14 DIAGNOSIS — R4701 Aphasia: Secondary | ICD-10-CM

## 2022-04-14 DIAGNOSIS — R519 Headache, unspecified: Secondary | ICD-10-CM | POA: Diagnosis not present

## 2022-04-14 MED ORDER — POTASSIUM CITRATE ER 10 MEQ (1080 MG) PO TBCR: EXTENDED_RELEASE_TABLET | ORAL | 0 refills | Status: DC

## 2022-04-14 NOTE — Telephone Encounter (Signed)
..   Encourage patient to contact the pharmacy for refills or they can request refills through [place]: 03/2022  NEXT APPOINTMENT DATE:  MEDICATION:potassium citrate (UROCIT-K) 10 MEQ (1080 MG) SR tablet   Is the patient out of medication? 1 week left  [facility]ARM[facility]Y: [facility]   Let patient know to contact pharmacy at the end of the day to make sure medication is ready.  Please notify patient to allow 48-72 hours to process Yes

## 2022-04-14 NOTE — Telephone Encounter (Signed)
Rx sent 

## 2022-04-17 ENCOUNTER — Telehealth: Payer: Self-pay

## 2022-04-17 NOTE — Telephone Encounter (Signed)
Patient would like a call about the MRI results, seen Dr.Jaffe's message but wanted to ask a question in regards.

## 2022-04-18 ENCOUNTER — Telehealth: Payer: Self-pay

## 2022-04-18 DIAGNOSIS — D329 Benign neoplasm of meninges, unspecified: Secondary | ICD-10-CM

## 2022-04-18 NOTE — Telephone Encounter (Signed)
Patient advised ofher results, MRI of brain does not reveal anything concerning that caused the episode.  It reveals some incidental findings.  There is a tiny benign mass called a meningioma.  For that, we would monitor. I would like to repeat MRI of brain with and without contrast in 6 months.  There is also evidence of chronic vascular changes in the brain related to age and history of high blood pressure and high cholesterol.  Continue treatment of blood pressure and cholesterol.  I would recommend taking an aspirin '81mg'$  daily.    Per Patient, the report also says, Background mild multifocal T2 FLAIR hyperintense signal abnormality within the cerebral white matter, nonspecific but compatible with chronic small vessel ischemic disease.  ( What does that mean)   Advised patient I can ask Dr.Jaffe and get back to her.

## 2022-04-18 NOTE — Telephone Encounter (Signed)
-----   Message from Pieter Partridge, DO sent at 04/17/2022  7:12 AM EST ----- MRI of brain does not reveal anything concerning that caused the episode.  It reveals some incidental findings.  There is a tiny benign mass called a meningioma.  For that, we would monitor. I would like to repeat MRI of brain with and without contrast in 6 months.  There is also evidence of chronic vascular changes in the brain related to age and history of high blood pressure and high cholesterol.  Continue treatment of blood pressure and cholesterol.  I would recommend taking an aspirin '81mg'$  daily.

## 2022-04-20 ENCOUNTER — Encounter: Payer: Self-pay | Admitting: Family Medicine

## 2022-04-20 ENCOUNTER — Ambulatory Visit (INDEPENDENT_AMBULATORY_CARE_PROVIDER_SITE_OTHER): Payer: Medicare Other | Admitting: Family Medicine

## 2022-04-20 VITALS — BP 116/62 | HR 80 | Temp 98.3°F | Ht 63.0 in | Wt 153.4 lb

## 2022-04-20 DIAGNOSIS — E785 Hyperlipidemia, unspecified: Secondary | ICD-10-CM | POA: Diagnosis not present

## 2022-04-20 DIAGNOSIS — I10 Essential (primary) hypertension: Secondary | ICD-10-CM

## 2022-04-20 NOTE — Progress Notes (Signed)
Phone [phone] In person visit   Subjective:   Rhonda Spence is a 71 y.o. year old very pleasant female patient who presents for/with See problem oriented charting Chief Complaint  Patient presents with   Follow-up    Would like to discuss MRI from Dr. Tomi Likens.   Hypertension   Hyperlipidemia   Past Medical History-  Patient Active Problem List   Diagnosis Date Noted   Low bone density 12/05/2021    Priority: Medium    Vitamin D deficiency 06/08/2018    Priority: Medium    Migraine with aura 01/11/2018    Priority: Medium    Insulin resistance 01/11/2018    Priority: Medium    Glaucoma 02/02/2016    Priority: Medium    Asthma, cough variant 02/02/2016    Priority: Medium    Obstructive sleep apnea of adult 02/02/2016    Priority: Medium    Hyperlipidemia, unspecified 02/12/2006    Priority: Medium    Essential hypertension 02/12/2006    Priority: Medium    Brittle nails 02/08/2018    Priority: Low   History of adenomatous polyp of colon 01/11/2018    Priority: Low   Hypertrophy of nasal turbinates 02/01/2017    Priority: Low   Allergic rhinitis 09/28/2016    Priority: Low   Bilateral temporomandibular joint pain 09/28/2016    Priority: Low   Seasonal and perennial allergic rhinitis 04/25/2016    Priority: Low   Age-related nuclear cataract of both eyes 01/12/2015    Priority: Low   NEPHROLITHIASIS, HX OF 02/12/2006    Priority: Low    Medications- reviewed and updated Current Outpatient Medications  Medication Sig Dispense Refill   acetaminophen (TYLENOL) 650 MG CR tablet Take 650 mg by mouth every 8 (eight) hours as needed for pain. As needed     amLODipine (NORVASC) 5 MG tablet Take 1 tablet (5 mg total) by mouth daily. 90 tablet 3   aspirin EC 81 MG tablet Take 81 mg by mouth daily. Swallow whole.     azelastine (ASTELIN) 0.1 % nasal spray Use in each nostril as directed 90 mL 0   calcium carbonate (OS-CAL) 600 MG TABS tablet Take 600 mg by  mouth daily with breakfast.     chlorthalidone (HYGROTON) 25 MG tablet Take 1 tablet by mouth once daily 90 tablet 0   Coenzyme Q10 100 MG capsule Take by mouth.     latanoprost (XALATAN) 0.005 % ophthalmic solution Place 1 drop into both eyes at bedtime.     losartan (COZAAR) 100 MG tablet Take 1 tablet by mouth once daily 90 tablet 0   montelukast (SINGULAIR) 10 MG tablet Take 1 tablet by mouth once daily with breakfast 90 tablet 0   potassium citrate (UROCIT-K) 10 MEQ (1080 MG) SR tablet Take 1 tablet by mouth twice daily 180 tablet 0   rosuvastatin (CRESTOR) 40 MG tablet Take 1 tablet (40 mg total) by mouth daily. TAKE 1 TABLET BY MOUTH DAILY 90 tablet 3   VENTOLIN HFA 108 (90 Base) MCG/ACT inhaler Inhale 2 puffs into the lungs every 4 (four) hours as needed for wheezing or shortness of breath. 18 g 3   nebivolol (BYSTOLIC) 2.5 MG tablet Take 1 tablet by mouth daily.     No current facility-administered medications for this visit.     Objective:  BP 116/62   Pulse 80   Temp 98.3 F (36.8 C)   Ht '5\' 3"'$  (1.6 m)   Wt 153 lb 6.4  oz (69.6 kg)   SpO2 97%   BMI 27.17 kg/m  Gen: NAD, resting comfortably     Assessment and Plan   # Prior word finding difficulty S:see last visit but ultimately patient with MRI on 04/14/22 and "1. No evidence of acute or recent subacute infarction. 2. 4 mm extra-axial mass overlying the anterior right temporal lobe, likely reflecting a tiny meningioma. No significant mass effect upon the underlying brain parenchyma. 3. Chronic lacunar infarct within the right corona radiata. 4. Background mild cerebral white matter chronic small vessel ischemic disease. 5. Mild paranasal sinus mucosal thickening."   Planned repeat in 6 months for meningioma follow up   Thought likely that prior issues related to migraine evolution A/P: thankfully no recurrent issues - we reviewed MRI findings together   #history of lacunar infarct #hyperlipidemia S:  Medication: rosuvastatin 40 mg up from twice a week to daily starting on November 14th.  Lab Results  Component Value Date   CHOL 165 12/05/2021   HDL 62.50 12/05/2021   LDLCALC 90 12/05/2021   LDLDIRECT 102.0 10/14/2020   TRIG 67.0 12/05/2021   CHOLHDL 3 12/05/2021   A/P: lipids hopefully improved- check next visit With lacunar infarct- continue aspirin neurology recommended  #hypertension S: medication: Amlodipine 5 mg, chlorthalidone 25 mg, losartan 100 mg, nebivolol 2.5 mg (prior dizziness with yardwork but not out much now- minimal dizziness at moment.  Home readings #s:  avg 137.3077 75 and trending down since starting nebivolol for both  Since starting nebivolol back  Home cuff 124/77 vs 120/76- very close BP Readings from Last 3 Encounters:  04/20/22 116/62  03/29/22 112/69  03/20/22 (!) 142/78  A/P: blood pressure very well controlled today- continue current medications  Recommended follow up: Return for next already scheduled visit or sooner if needed. Future Appointments  Date Time Provider [place]  05/29/2022 11:00 AM [facility]ALTH COACH [facility]  06/07/2022  9:20 AM Yong Channel Brayton Mars, MD [facility]    Lab/Order associations:   ICD-10-CM   1. Hyperlipidemia, unspecified hyperlipidemia type  E78.5     2. Essential hypertension  I10      Return precautions advised.  Garret Reddish, MD

## 2022-04-20 NOTE — Patient Instructions (Addendum)
Health Maintenance Due  Topic Date Due   Medicare Annual Wellness (AWV)  05/17/2022  You are eligible to schedule your annual wellness visit with our nurse specialist Otila Kluver.  Please consider scheduling this before you leave today  Labs next visit  Recommended follow up: Return for next already scheduled visit or sooner if needed.

## 2022-04-21 NOTE — Telephone Encounter (Signed)
Patient advised.  Those are chronic age-related changes, often seen with history of high blood pressure and high cholesterol.

## 2022-05-12 ENCOUNTER — Other Ambulatory Visit: Payer: Self-pay | Admitting: Family Medicine

## 2022-05-15 ENCOUNTER — Other Ambulatory Visit: Payer: Self-pay | Admitting: Family Medicine

## 2022-05-25 ENCOUNTER — Other Ambulatory Visit: Payer: Self-pay | Admitting: Family Medicine

## 2022-05-29 ENCOUNTER — Ambulatory Visit (INDEPENDENT_AMBULATORY_CARE_PROVIDER_SITE_OTHER): Payer: Medicare Other

## 2022-05-29 VITALS — Wt 150.0 lb

## 2022-05-29 DIAGNOSIS — Z0000 Encounter for general adult medical examination without abnormal findings: Secondary | ICD-10-CM

## 2022-05-29 NOTE — Progress Notes (Signed)
I connected with  Zebedee Iba Alberto on 05/29/22 by a audio enabled telemedicine application and verified that I am speaking with the correct person using two identifiers.  Patient Location: Home  Provider Location: Office/Clinic  I discussed the limitations of evaluation and management by telemedicine. The patient expressed understanding and agreed to proceed.   Subjective:   Mesha Schamberger is a 72 y.o. female who presents for Medicare Annual (Subsequent) preventive examination.  Review of Systems     Cardiac Risk Factors include: advanced age (>87mn, >>58women);dyslipidemia;hypertension     Objective:    Today's Vitals   05/29/22 1121  Weight: 150 lb (68 kg)   Body mass index is 26.57 kg/m.     05/29/2022   11:32 AM 03/29/2022   12:47 PM 05/17/2021   11:18 AM 04/26/2020   10:42 AM 02/28/2019    1:26 PM  Advanced Directives  Does Patient Have a Medical Advance Directive? Yes Yes Yes Yes Yes  Type of AParamedicof APowellLiving will [place]rivacaLiving will Living will Living will;Healthcare Power of AEnsignLiving will  Does patient want to make changes to medical advance directive?     Yes (MAU/Ambulatory/Procedural Areas - Information given)  Copy of HTalking Rockin Chart? No - copy requested   No - copy requested No - copy requested    Current Medications (verified) Outpatient Encounter Medications as of 05/29/2022  Medication Sig   acetaminophen (TYLENOL) 650 MG CR tablet Take 650 mg by mouth every 8 (eight) hours as needed for pain. As needed   amLODipine (NORVASC) 5 MG tablet Take 1 tablet (5 mg total) by mouth daily.   aspirin EC 81 MG tablet Take 81 mg by mouth daily. Swallow whole.   azelastine (ASTELIN) 0.1 % nasal spray Use in each nostril as directed   calcium carbonate (OS-CAL) 600 MG TABS tablet Take 600 mg by mouth daily with breakfast.   chlorthalidone  (HYGROTON) 25 MG tablet Take 1 tablet by mouth once daily   Coenzyme Q10 100 MG capsule Take by mouth.   latanoprost (XALATAN) 0.005 % ophthalmic solution Place 1 drop into both eyes at bedtime.   losartan (COZAAR) 100 MG tablet Take 1 tablet by mouth once daily   montelukast (SINGULAIR) 10 MG tablet Take 1 tablet by mouth once daily with breakfast   nebivolol (BYSTOLIC) 2.5 MG tablet Take 1 tablet by mouth once daily   potassium citrate (UROCIT-K) 10 MEQ (1080 MG) SR tablet Take 1 tablet by mouth twice daily   rosuvastatin (CRESTOR) 40 MG tablet Take 1 tablet (40 mg total) by mouth daily. TAKE 1 TABLET BY MOUTH DAILY   VENTOLIN HFA 108 (90 Base) MCG/ACT inhaler Inhale 2 puffs into the lungs every 4 (four) hours as needed for wheezing or shortness of breath.   No facility-administered encounter medications on file as of 05/29/2022.    Allergies (verified) Other, Iodine, and Lipitor [atorvastatin calcium]   History: Past Medical History:  Diagnosis Date   Allergy    Anemia    Asthma    Cataract    Chronic kidney disease    stones   GERD (gastroesophageal reflux disease)    Glaucoma    Hyperlipidemia    Hypertension    Nephrolithiasis    has required lithotripsy   Sleep apnea    wears an oral appliance   Past Surgical History:  Procedure Laterality Date   ADENOIDECTOMY  BREAST BIOPSY Left 07/09/2009   benign x2   BREAST BIOPSY     CESAREAN SECTION     x2   COLONOSCOPY     LITHOTRIPSY  2011   kimbrough   POLYPECTOMY     TONSILLECTOMY     widom teeth extraction  1976   Family History  Problem Relation Age of Onset   Hypertension Mother    Heart failure Mother    Asthma Mother    COPD Mother        led to death early 35s   Heart disease Father    Heart failure Father        related to osteogenesis imperfecta. died at 12   Osteogenesis imperfecta Father    Osteogenesis imperfecta Sister        possibly related to this- led to death   Breast cancer Sister         half sister   Stroke Brother    Healthy Brother    Breast cancer Maternal Aunt    Obesity Maternal Grandmother    Hyperlipidemia Paternal Grandmother    Hypertension Paternal Grandmother    Hyperlipidemia Son    Migraines Son    Hyperlipidemia Son    Glaucoma Son    Colon cancer Neg Hx    Colon polyps Neg Hx    Esophageal cancer Neg Hx    Stomach cancer Neg Hx    Rectal cancer Neg Hx    Social History   Socioeconomic History   Marital status: Married    Spouse name: Not on file   Number of children: Not on file   Years of education: Not on file   Highest education level: Not on file  Occupational History   Occupation: retired    Occupation: housewife  Tobacco Use   Smoking status: Never   Smokeless tobacco: Never  Vaping Use   Vaping Use: Never used  Substance and Sexual Activity   Alcohol use: Not Currently    Alcohol/week: 0.0 - 1.0 standard drinks of alcohol   Drug use: No   Sexual activity: Yes  Other Topics Concern   Not on file  Social History Narrative   Married (husband Fritz Pickerel patient of Dr. Yong Channel ). Son 50 with CP in group home, son 92 lives in [place]. 2 grandkids in [place] 11 and 8 in 2019 (not expecting more).       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 Book club   Social Determinants of Health   Financial Resource Strain: Low Risk  (05/17/2021)   Overall Financial Resource Strain (CARDIA)    Difficulty of Paying Living Expenses: Not hard at all  Food Insecurity: No Food Insecurity (05/17/2021)   Hunger Vital Sign    Worried About Running Out of Food in the Last Year: Never true    Ran Out of Food in the Last Year: Never true  Transportation Needs: No Transportation Needs (05/17/2021)   PRAPARE - Civil engineer, contracting (Medical): No    Lack of Transportation (Non-Medical): No  Physical Activity: Inactive (05/17/2021)   Exercise Vital Sign    Days of Exercise per Week: 0 days    Minutes of Exercise per Session: 0 min  Stress: Stress Concern Present (05/29/2022)  [place] Questionnaire    Feeling of Stress : To some extent  Social Connections: Unknown (05/29/2022)   Social Connection and Isolation Panel [NHANES]    Frequency of Communication with Friends and Family: Not on file    Frequency of Social Gatherings with Friends and Family: Not on file    Attends Religious Services: More than 4 times per year    Active Member of [employer] or Organizations: Not on file    Attends Archivist Meetings: Not on file    Marital Status: Not on file    Tobacco Counseling Counseling given: Not Answered   Clinical Intake:  Pre-visit preparation completed: Yes  Pain : No/denies pain     BMI - recorded: 26.57 Nutritional Status: BMI 25 -29 Overweight Nutritional Risks: None Diabetes: No  How often do you need to have someone help you when you read instructions, pamphlets, or other written materials from your doctor or pharmacy?: 1 - Never  Diabetic?no  Interpreter Needed?: No  Information entered by :: Charlott Rakes, LPN   Activities of Daily Living    05/29/2022   10:34 AM  In your present state of health, do you have any difficulty performing the following activities:  Hearing? 0  Vision? 0  Difficulty concentrating or making decisions? 0  Walking or climbing stairs? 0  Dressing or bathing? 0  Doing errands, shopping? 0  Preparing Food and eating ? N  Using the Toilet? N  In the past six months, have you accidently leaked urine? Y  Comment noticing alot lately  Do you have problems with loss of bowel control? N  Managing your Medications? N  Managing your Finances? N  Housekeeping or managing your Housekeeping?  N    Patient Care Team: Marin Olp, MD as PCP - General (Family Medicine) Verneita Griffes, MD as Consulting Physician (Ophthalmology) Rosemary Holms, DPM as Consulting Physician (Podiatry) Pedro Earls, MD as Attending Physician (Family Medicine) Madelin Rear, [facility] (Inactive) as Pharmacist (Pharmacist)  Indicate any recent Medical Services you may have received from other than [facility] providers in the past year (date may be approximate).     Assessment:   This is a routine wellness examination for Chassie.  Hearing/Vision screen Hearing Screening - Comments:: Pt denies any hearing issues  Vision Screening - Comments:: Pt follows up with Dr Lauretta Grill at [facility] for annual eye exams   Dietary issues and exercise activities discussed: Current Exercise Habits: Home exercise routine, Type of exercise: walking, Time (Minutes): 30, Frequency (Times/Week): 3, Weekly Exercise (Minutes/Week): 90   Goals Addressed             This Visit's Progress    Patient Stated       Get scalp itching under control       Depression Screen    05/29/2022   11:28 AM 03/20/2022    2:45 PM 05/17/2021   11:17 AM 10/14/2020    9:52 AM 04/26/2020   10:36 AM 02/28/2019    1:26 PM 01/11/2018    9:17 AM  PHQ 2/9 Scores  PHQ - 2 Score 0 0 0 0 0 0 1    Fall Risk    05/29/2022   10:34 AM 03/29/2022   12:47 PM 03/20/2022    2:45 PM 05/17/2021   11:19 AM 10/14/2020    9:51 AM  Fall Risk   Falls in the past year? 0 0 0 0 0  Number falls  in past yr: 0 0 0 0 0  Injury with Fall? 0 0 0 0 0  Risk for fall due to : Impaired vision   Impaired vision;Impaired balance/gait   Risk for fall due to: Comment    dizzy at times may be med related   Follow up Falls prevention discussed Falls evaluation completed  Falls prevention discussed     FALL RISK PREVENTION PERTAINING TO THE HOME:  Any stairs in or around the home? Yes  If so, are there any without handrails? No  Home free of loose throw rugs in  walkways, pet beds, electrical cords, etc? Yes  Adequate lighting in your home to reduce risk of falls? Yes   ASSISTIVE DEVICES UTILIZED TO PREVENT FALLS:  Life alert? No  Use of a cane, walker or w/c? No  Grab bars in the bathroom? No  Shower chair or bench in shower? Yes  Elevated toilet seat or a handicapped toilet? Yes   TIMED UP AND GO:  Was the test performed? No .   Cognitive Function:        05/29/2022   11:33 AM 05/17/2021   11:23 AM  6CIT Screen  What Year? 0 points 0 points  What month? 0 points 0 points  What time? 0 points 0 points  Count back from 20 0 points 0 points  Months in reverse 0 points 0 points  Repeat phrase 0 points 0 points  Total Score 0 points 0 points    Immunizations Immunization History  Administered Date(s) Administered   Fluad Quad(high Dose 65+) 12/31/2018, 01/13/2022   Influenza Split 05/01/2011, 02/05/2017   Influenza Whole 01/17/2008, 02/05/2010   Influenza, High Dose Seasonal PF 01/11/2018   Influenza,inj,Quad PF,6+ Mos 02/07/2013, 03/08/2016   Influenza-Unspecified 03/03/2016, 02/03/2020, 01/29/2021   MMR 09/06/2017   PFIZER Comirnaty(Gray Top)Covid-19 Tri-Sucrose Vaccine 02/06/2022   PFIZER(Purple Top)SARS-COV-2 Vaccination 06/12/2019, 07/03/2019, 02/03/2020, 09/15/2020, 01/29/2021   PNEUMOCOCCAL CONJUGATE-20 10/19/2020   Pneumococcal Conjugate-13 08/11/2014, 01/11/2018   Pneumococcal Polysaccharide-23 09/01/2015   Respiratory Syncytial Virus Vaccine,Recomb Aduvanted(Arexvy) 01/14/2022   Td 05/09/1999, 07/05/2009   Tdap 09/06/2017   Zoster Recombinat (Shingrix) 01/17/2018, 04/25/2018   Zoster, Live 08/05/2010    TDAP status: Up to date  Flu Vaccine status: Up to date  Pneumococcal vaccine status: Up to date  Covid-19 vaccine status: Completed vaccines  Qualifies for Shingles Vaccine? Yes   Zostavax completed Yes   Shingrix Completed?: Yes  Screening Tests Health Maintenance  Topic Date Due   COVID-19 Vaccine  (7 - 2023-24 season) 04/03/2022   COLONOSCOPY (Pts 45-62yr Insurance coverage will need to be confirmed)  04/19/2023   Medicare Annual Wellness (AWV)  05/30/2023   MAMMOGRAM  12/03/2023   DTaP/Tdap/Td (4 - Td or Tdap) 09/07/2027   Pneumonia Vaccine 74 Years old  Completed   INFLUENZA VACCINE  Completed   DEXA SCAN  Completed   Hepatitis C Screening  Completed   Zoster Vaccines- Shingrix  Completed   HPV VACCINES  Aged Out    Health Maintenance  Health Maintenance Due  Topic Date Due   COVID-19 Vaccine (7 - 2023-24 season) 04/03/2022    Colorectal cancer screening: Type of screening: Colonoscopy. Completed 04/18/18. Repeat every 5 years  Mammogram status: Completed 12/02/21. Repeat every year  Bone Density status: Completed 12/12/21. Results reflect: Bone density results: OSTEOPENIA. Repeat every 2 years.   Additional Screening:  Hepatitis C Screening:  Completed 01/11/18  Vision Screening: Recommended annual ophthalmology exams for early detection of glaucoma and other  disorders of the eye. Is the patient up to date with their annual eye exam?  Yes  Who is the provider or what is the name of the office in which the patient attends annual eye exams? Dr Newman Nip  If pt is not established with a provider, would they like to be referred to a provider to establish care? No .   Dental Screening: Recommended annual dental exams for proper oral hygiene  Community Resource Referral / Chronic Care Management: CRR required this visit?  No   CCM required this visit?  No      Plan:     I have personally reviewed and noted the following in the patient's chart:   Medical and social history Use of alcohol, tobacco or illicit drugs  Current medications and supplements including opioid prescriptions. Patient is not currently taking opioid prescriptions. Functional ability and status Nutritional status Physical activity Advanced directives List of other  physicians Hospitalizations, surgeries, and ER visits in previous 12 months Vitals Screenings to include cognitive, depression, and falls Referrals and appointments  In addition, I have reviewed and discussed with patient certain preventive protocols, quality metrics, and best practice recommendations. A written personalized care plan for preventive services as well as general preventive health recommendations were provided to patient.     Willette Brace, LPN   12/18/7515   Nurse Notes: none

## 2022-05-29 NOTE — Patient Instructions (Signed)
[name] , Thank you for taking time to come for your Medicare Wellness Visit. I appreciate your ongoing commitment to your health goals. Please review the following plan we discussed and let me know if I can assist you in the future.   These are the goals we discussed:  Goals      Blood Pressure < 140/90     Patient Stated     Get back into the [employer] first of the year     Patient Stated     Get back to working out      Patient Stated     Get scalp itching under control     [place] (see longitudinal plan of care for additional care plan information)  Current Barriers:  Chronic Disease Management support, education, and care coordination needs related to Hypertension, Hyperlipidemia, and hyperglycemia (high blood sugar)   Hypertension BP Readings from Last 3 Encounters:  12/22/19 114/62  02/28/19 126/74  02/28/19 126/74  Pharmacist Clinical Goal(s): Over the next 180 days, patient will work with PharmD and providers to maintain BP goal <130/80 while ensuring medication safety (minimize dizziness) Current regimen:  Amlodipine 10 mg once daily Bystolic 2.5 mg once daily  Losartan 100 mg once daily Chlorthalidone 25 mg once daily Interventions: Reviewed diet and exercise recommendations - goal to get back to the [facility]  Patient self care activities - Over the next 180 days, patient will: [facility] for routine exercise Check BP daily in afternoon/evening, document, and provide at future appointments Ensure daily salt intake < 1500 mg/day  Hyperlipidemia Lab Results  Component Value Date/Time   LDLCALC 123 (H) 01/01/2020 08:51 AM   LDLDIRECT 177.4 06/25/2009 08:33 AM  Pharmacist Clinical Goal(s): Over the next 365 days, patient will work with PharmD and providers to achieve LDL goal < 70 Current regimen:  Rosuvastatin 40 mg twice weekly  Interventions: Reviewed diet and exercise recommendations - goal to get back to the [facility]  Patient self  care activities - Over the next 365 days, patient will: [facility] for routine exercise  Hyperglycemia (high blood sugar) Lab Results  Component Value Date/Time   HGBA1C 5.7 (H) 01/01/2020 08:51 AM   HGBA1C 5.6 09/04/2017 12:00 AM  Pharmacist Clinical Goal(s): Over the next 180 days, patient will work with PharmD and providers to achieve A1c goal <6.5% Current regimen:  Diet and exercise Interventions: Reviewed diet and exercise recommendations - goal to get back to the [facility]  Patient self care activities - Over the next 180 days, patient will: [facility] for routine exercise  Medication management Pharmacist Clinical Goal(s): Over the next 180 days, patient will work with PharmD and providers to maintain optimal medication adherence Current pharmacy: [facility] Interventions Comprehensive medication review performed. Continue current medication management strategy Patient self care activities - Over the next 180 days, patient will: Take medications as prescribed Report any questions or concerns to PharmD and/or provider(s) Initial goal documentation.        This is a list of the screening recommended for you and due dates:  Health Maintenance  Topic Date Due   COVID-19 Vaccine (7 - 2023-24 season) 04/03/2022   Colon Cancer Screening  04/19/2023   Medicare Annual Wellness Visit  05/30/2023   Mammogram  12/03/2023   DTaP/Tdap/Td vaccine (4 - Td or Tdap) 09/07/2027   Pneumonia Vaccine  Completed   Flu Shot  Completed   DEXA scan (bone density measurement)  Completed   Hepatitis C Screening: USPSTF Recommendation to screen - Ages 45-79 yo.  Completed   Zoster (Shingles) Vaccine  Completed   HPV Vaccine  Aged Out    Advanced directives: Please bring a copy of your health care power of attorney and living will to the office at your convenience.  Conditions/risks identified: to get scalp itching under control   Next appointment: Follow up in one year for your  annual wellness visit    Preventive Care 65 Years and Older, Female Preventive care refers to lifestyle choices and visits with your health care provider that can promote health and wellness. What does preventive care include? A yearly physical exam. This is also called an annual well check. Dental exams once or twice a year. Routine eye exams. Ask your health care provider how often you should have your eyes checked. Personal lifestyle choices, including: Daily care of your teeth and gums. Regular physical activity. Eating a healthy diet. Avoiding tobacco and drug use. Limiting alcohol use. Practicing safe sex. Taking low-dose aspirin every day. Taking vitamin and mineral supplements as recommended by your health care provider. What happens during an annual well check? The services and screenings done by your health care provider during your annual well check will depend on your age, overall health, lifestyle risk factors, and family history of disease. Counseling  Your health care provider may ask you questions about your: Alcohol use. Tobacco use. Drug use. Emotional well-being. Home and relationship well-being. Sexual activity. Eating habits. History of falls. Memory and ability to understand (cognition). Work and work Statistician. Reproductive health. Screening  You may have the following tests or measurements: Height, weight, and BMI. Blood pressure. Lipid and cholesterol levels. These may be checked every 5 years, or more frequently if you are over 46 years old. Skin check. Lung cancer screening. You may have this screening every year starting at age 5 if you have a 30-pack-year history of smoking and currently smoke or have quit within the past 15 years. Fecal occult blood test (FOBT) of the stool. You may have this test every year starting at age 55. Flexible sigmoidoscopy or colonoscopy. You may have a sigmoidoscopy every 5 years or a colonoscopy every 10 years  starting at age 69. Hepatitis C blood test. Hepatitis B blood test. Sexually transmitted disease (STD) testing. Diabetes screening. This is done by checking your blood sugar (glucose) after you have not eaten for a while (fasting). You may have this done every 1-3 years. Bone density scan. This is done to screen for osteoporosis. You may have this done starting at age 26. Mammogram. This may be done every 1-2 years. Talk to your health care provider about how often you should have regular mammograms. Talk with your health care provider about your test results, treatment options, and if necessary, the need for more tests. Vaccines  Your health care provider may recommend certain vaccines, such as: Influenza vaccine. This is recommended every year. Tetanus, diphtheria, and acellular pertussis (Tdap, Td) vaccine. You may need a Td booster every 10 years. Zoster vaccine. You may need this after age 74. Pneumococcal 13-valent conjugate (PCV13) vaccine. One dose is recommended after age 43. Pneumococcal polysaccharide (PPSV23) vaccine. One dose is recommended after age 36. Talk to your health care provider about which screenings and vaccines you need and how often you need them. This information is not intended to replace advice given to you by your health care provider. Make sure you discuss any questions you  have with your health care provider. Document Released: 05/21/2015 Document Revised: 01/12/2016 Document Reviewed: 02/23/2015 Elsevier Interactive Patient Education  2017 [employer] Prevention in the Home Falls can cause injuries. They can happen to people of all ages. There are many things you can do to make your home safe and to help prevent falls. What can I do on the outside of my home? Regularly fix the edges of walkways and driveways and fix any cracks. Remove anything that might make you trip as you walk through a door, such as a raised step or threshold. Trim any bushes or  trees on the path to your home. Use bright outdoor lighting. Clear any walking paths of anything that might make someone trip, such as rocks or tools. Regularly check to see if handrails are loose or broken. Make sure that both sides of any steps have handrails. Any raised decks and porches should have guardrails on the edges. Have any leaves, snow, or ice cleared regularly. Use sand or salt on walking paths during winter. Clean up any spills in your garage right away. This includes oil or grease spills. What can I do in the bathroom? Use night lights. Install grab bars by the toilet and in the tub and shower. Do not use towel bars as grab bars. Use non-skid mats or decals in the tub or shower. If you need to sit down in the shower, use a plastic, non-slip stool. Keep the floor dry. Clean up any water that spills on the floor as soon as it happens. Remove soap buildup in the tub or shower regularly. Attach bath mats securely with double-sided non-slip rug tape. Do not have throw rugs and other things on the floor that can make you trip. What can I do in the bedroom? Use night lights. Make sure that you have a light by your bed that is easy to reach. Do not use any sheets or blankets that are too big for your bed. They should not hang down onto the floor. Have a firm chair that has side arms. You can use this for support while you get dressed. Do not have throw rugs and other things on the floor that can make you trip. What can I do in the kitchen? Clean up any spills right away. Avoid walking on wet floors. Keep items that you use a lot in easy-to-reach places. If you need to reach something above you, use a strong step stool that has a grab bar. Keep electrical cords out of the way. Do not use floor polish or wax that makes floors slippery. If you must use wax, use non-skid floor wax. Do not have throw rugs and other things on the floor that can make you trip. What can I do with my  stairs? Do not leave any items on the stairs. Make sure that there are handrails on both sides of the stairs and use them. Fix handrails that are broken or loose. Make sure that handrails are as long as the stairways. Check any carpeting to make sure that it is firmly attached to the stairs. Fix any carpet that is loose or worn. Avoid having throw rugs at the top or bottom of the stairs. If you do have throw rugs, attach them to the floor with carpet tape. Make sure that you have a light switch at the top of the stairs and the bottom of the stairs. If you do not have them, ask someone to add them for you.  What else can I do to help prevent falls? Wear shoes that: Do not have high heels. Have rubber bottoms. Are comfortable and fit you well. Are closed at the toe. Do not wear sandals. If you use a stepladder: Make sure that it is fully opened. Do not climb a closed stepladder. Make sure that both sides of the stepladder are locked into place. Ask someone to hold it for you, if possible. Clearly mark and make sure that you can see: Any grab bars or handrails. First and last steps. Where the edge of each step is. Use tools that help you move around (mobility aids) if they are needed. These include: Canes. Walkers. Scooters. Crutches. Turn on the lights when you go into a dark area. Replace any light bulbs as soon as they burn out. Set up your furniture so you have a clear path. Avoid moving your furniture around. If any of your floors are uneven, fix them. If there are any pets around you, be aware of where they are. Review your medicines with your doctor. Some medicines can make you feel dizzy. This can increase your chance of falling. Ask your doctor what other things that you can do to help prevent falls. This information is not intended to replace advice given to you by your health care provider. Make sure you discuss any questions you have with your health care provider. Document  Released: 02/18/2009 Document Revised: 09/30/2015 Document Reviewed: 05/29/2014 Elsevier Interactive Patient Education  2017 [employer].

## 2022-06-06 ENCOUNTER — Encounter: Payer: Self-pay | Admitting: Family Medicine

## 2022-06-06 ENCOUNTER — Telehealth (INDEPENDENT_AMBULATORY_CARE_PROVIDER_SITE_OTHER): Payer: Medicare Other | Admitting: Family Medicine

## 2022-06-06 VITALS — Ht 63.0 in | Wt 150.0 lb

## 2022-06-06 DIAGNOSIS — L299 Pruritus, unspecified: Secondary | ICD-10-CM

## 2022-06-06 DIAGNOSIS — Z1283 Encounter for screening for malignant neoplasm of skin: Secondary | ICD-10-CM

## 2022-06-06 DIAGNOSIS — E785 Hyperlipidemia, unspecified: Secondary | ICD-10-CM

## 2022-06-06 DIAGNOSIS — J45991 Cough variant asthma: Secondary | ICD-10-CM

## 2022-06-06 DIAGNOSIS — I10 Essential (primary) hypertension: Secondary | ICD-10-CM | POA: Diagnosis not present

## 2022-06-06 MED ORDER — LOSARTAN POTASSIUM 100 MG PO TABS: 100.0000 mg | ORAL_TABLET | Freq: Every day | ORAL | 3 refills | Status: DC

## 2022-06-06 MED ORDER — KETOCONAZOLE 2 % EX SHAM: 1.0000 | MEDICATED_SHAMPOO | CUTANEOUS | 2 refills | Status: DC

## 2022-06-06 NOTE — Progress Notes (Signed)
Phone [phone] Virtual visit via Video note   Subjective:  Chief complaint: Chief Complaint  Patient presents with   Follow-up   Hypertension   Hyperlipidemia   itchy scalp    Pt c/o itchy scalp that she has had for about a year, she has tried sea breeze and Slesum blue which did help but did not fix it.    urine leak    Pt c/o early morning urine leaks that she noticed about 6 months ago   This visit type was conducted due to national recommendations for restrictions regarding the COVID-19 Pandemic (e.g. social distancing).  This format is felt to be most appropriate for this patient at this time balancing risks to patient and risks to population by having him in for in person visit.  No physical exam was performed (except for noted visual exam or audio findings with Telehealth visits).    Our team/I connected with Zebedee Iba Bergin at  1:00 PM EST by a video enabled telemedicine application (doxy.me or caregility through epic) and verified that I am speaking with the correct person using two identifiers.  Location patient: [facility] Location provider: [facility], office Persons participating in the virtual visit:  patient  Our team/I discussed the limitations of evaluation and management by telemedicine and the availability of in person appointments. In light of current covid-19 pandemic, patient also understands that we are trying to protect them by minimizing in office contact if at all possible.  The patient expressed consent for telemedicine visit and agreed to proceed. Patient understands insurance will be billed.   Past Medical History-  Patient Active Problem List   Diagnosis Date Noted   Low bone density 12/05/2021    Priority: Medium    Vitamin D deficiency 06/08/2018    Priority: Medium    Migraine with aura 01/11/2018    Priority: Medium    Insulin resistance 01/11/2018    Priority: Medium    Glaucoma 02/02/2016    Priority: Medium    Asthma, cough variant  02/02/2016    Priority: Medium    Obstructive sleep apnea of adult 02/02/2016    Priority: Medium    Hyperlipidemia, unspecified 02/12/2006    Priority: Medium    Essential hypertension 02/12/2006    Priority: Medium    Brittle nails 02/08/2018    Priority: Low   History of adenomatous polyp of colon 01/11/2018    Priority: Low   Hypertrophy of nasal turbinates 02/01/2017    Priority: Low   Allergic rhinitis 09/28/2016    Priority: Low   Bilateral temporomandibular joint pain 09/28/2016    Priority: Low   Seasonal and perennial allergic rhinitis 04/25/2016    Priority: Low   Age-related nuclear cataract of both eyes 01/12/2015    Priority: Low   NEPHROLITHIASIS, HX OF 02/12/2006    Priority: Low    Medications- reviewed and updated Current Outpatient Medications  Medication Sig Dispense Refill   acetaminophen (TYLENOL) 650 MG CR tablet Take 650 mg by mouth every 8 (eight) hours as needed for pain. As needed     amLODipine (NORVASC) 5 MG tablet Take 1 tablet (5 mg total) by mouth daily. 90 tablet 3   aspirin EC 81 MG tablet Take 81 mg by mouth daily. Swallow whole.     azelastine (ASTELIN) 0.1 % nasal spray Use in each nostril as directed 90 mL 0   calcium carbonate (OS-CAL) 600 MG TABS tablet Take 600 mg by mouth daily with breakfast.  chlorthalidone (HYGROTON) 25 MG tablet Take 1 tablet by mouth once daily 90 tablet 0   Cholecalciferol (VITAMIN D-3 PO) Take 5,400 Int'l Units/L by mouth.     Coenzyme Q10 100 MG capsule Take by mouth.     [START ON 06/08/2022] ketoconazole (NIZORAL) 2 % shampoo Apply 1 Application topically 2 (two) times a week. 120 mL 2   latanoprost (XALATAN) 0.005 % ophthalmic solution Place 1 drop into both eyes at bedtime.     montelukast (SINGULAIR) 10 MG tablet Take 1 tablet by mouth once daily with breakfast 90 tablet 0   nebivolol (BYSTOLIC) 2.5 MG tablet Take 1 tablet by mouth once daily 90 tablet 0   potassium citrate (UROCIT-K) 10 MEQ (1080 MG) SR  tablet Take 1 tablet by mouth twice daily 180 tablet 0   rosuvastatin (CRESTOR) 40 MG tablet Take 1 tablet (40 mg total) by mouth daily. TAKE 1 TABLET BY MOUTH DAILY 90 tablet 3   VENTOLIN HFA 108 (90 Base) MCG/ACT inhaler Inhale 2 puffs into the lungs every 4 (four) hours as needed for wheezing or shortness of breath. 18 g 3   losartan (COZAAR) 100 MG tablet Take 1 tablet (100 mg total) by mouth daily. 90 tablet 3   No current facility-administered medications for this visit.     Objective:  Ht '5\' 3"'$  (1.6 m)   Wt 150 lb (68 kg)   BMI 26.57 kg/m  self reported vitals Gen: NAD, resting comfortably Lungs: nonlabored, normal respiratory rate  Skin: appears dry, no obvious rash     Assessment and Plan   #hypertension S: medication: Losartan, 100 mg; Amlodipine 5 mg; Chlorthalidone 25 mg -Hold nebivolol 2.5 mg in mid 2023 due to orthostatic symptoms- had to restart dec 2023- no issues since restart Home readings #s: generally 130/65 or 70 A/P: stable- continue current medicines   #hyperlipidemia- peak LDL at 206 # History of lacunar infarct noted on MRI 04/14/2022-also has a 4 mm meningioma with consideration of 51-monthrepeat S: Medication:Rosuvastatin 40 mg, coenzyme Q 10 Lab Results  Component Value Date   CHOL 165 12/05/2021   HDL 62.50 12/05/2021   LDLCALC 90 12/05/2021   LDLDIRECT 102.0 10/14/2020   TRIG 67.0 12/05/2021   CHOLHDL 3 12/05/2021  A/P: lipids hopefully improving- update with next labs - does have MRI follow up scheduled in June   # Hyperglycemia/insulin resistance/prediabetes- peak a1c of 6.3 but most recently 6.1 in 2023 S:  Medication: none Exercise and diet- exercise tougher with weather but plans to engage when weather- still active in home, some poor choices over Christmas Lab Results  Component Value Date   HGBA1C 6.1 12/05/2021   HGBA1C 5.9 10/14/2020   HGBA1C 5.7 (H) 01/01/2020  A/P: hopefully stable - update a1c next visit- she is thinking  april  # Itchy scalp S:mostly in crown of her head for about a year. Has tried sea breeze and Selsun blue which helped but did not resolve issue. No history of dandruff in that area- hair dresser checked her out. Rotates products and no change with rotation  A/P: sounds like potential  seborrheic dermatitis- add in ketoconazole shampoo twice a week and if not improving- can ask dermatology- we did a general referral today   # Urinary leakage S: Has noticed early in the morning for about 6 months. she is doing her Kegel exercises. No dysuria. Doubts UTI. Tried to increase water consumption overall- rarely has to get up to pee at night. Stops fluids  at 7 pm and in bed 10 or 11 and goes to bathroom before bed. May have drip or two on way to bathroom.  A/P: Sounds like overflow incontinence.  Discussed wearing a small pad/liner as an option since only losing 1 or 2 drops typically and otherwise is doing the right things with Kegels and stopping fluids before bed.  Could set an alarm but she would like to hold off on that for now.  Does not want to pursue urinalysis/culture workup  Recommended follow up: had planned on April or so- she will call to schedule Future Appointments  Date Time Provider [place]  06/20/2022 10:30 AM Baird Lyons D, MD [facility] None  10/18/2022 10:40 AM [facility] MR 3 [id number] [address] [facility]  06/05/2023 11:15 AM [facility] HEALTH COACH [facility]    Lab/Order associations:   ICD-10-CM   1. Essential hypertension  I10     2. Hyperlipidemia, unspecified hyperlipidemia type  E78.5     3. Asthma, cough variant  J45.991     4. Screening exam for skin cancer  Z12.83 Ambulatory referral to Dermatology    5. Itchy scalp  L29.9 Ambulatory referral to Dermatology      Meds ordered this encounter  Medications   losartan (COZAAR) 100 MG tablet    Sig: Take 1 tablet (100 mg total) by mouth daily.    Dispense:  90 tablet    Refill:  3   ketoconazole (NIZORAL) 2 %  shampoo    Sig: Apply 1 Application topically 2 (two) times a week.    Dispense:  120 mL    Refill:  2    Return precautions advised.  Garret Reddish, MD

## 2022-06-07 ENCOUNTER — Ambulatory Visit: Payer: Medicare Other | Admitting: Family Medicine

## 2022-06-13 ENCOUNTER — Telehealth: Payer: Self-pay | Admitting: Internal Medicine

## 2022-06-14 NOTE — Telephone Encounter (Signed)
Unable to speak with Kimel. Faxing last office visit and Sleep study to Dr. Ron Parker office. Nothing further needed.

## 2022-06-17 NOTE — Progress Notes (Signed)
HPI female never smoker followed for chronic cough/ Asthma, seasonal allergic rhinitis, OSA complicated by glaucoma FENO- 02/02/16- 28- mild elevation Office Spirometry 02/02/2016-normal. FVC 2.76/96%, FEV1 2.31/105%, FEV1/FVC 0.84, FEF 25-75 percent 2.59/131% Allergy Profile 02/02/2016-total IgE 40, elevated specific IgE for dog 7.80 CBC with differential 02/02/2016-WNL, normal eosinophils Unattended Home Sleep Test 02/12/2016-AHI 31.4/hour, desaturation to 72%, body weight 164 pounds  Methacholine Test 09/05/16- positive for hyperreactive airways. FEV1 declined by more than 20%  Home Sleep Test 02/12/2016-AHI 31.4/hour, desaturation to 72%, body weight 164 pounds. ---------------------------------------------------------------- 04/26/17- 72 year old female never smoker followed for chronic cough/ Asthma, seasonal allergic rhinitis, OSA, complicated by glaucoma Oral appliance-Dr. Ron Parker for OSA ---OSA; Pt wears Oral appliance every night.  Ventolin HFA, Singulair, benzonatate Perles No cough or wheeze at all-"much better". Continues using oral appliance. Snored on a trip sleeping on a flat bed. Dr Erik Obey suggested she use Afrin once daily at bedtime if needed to help with snoring. Lingering concern that home sleep test indicates residual apneas. She had failed to tolerate CPAP and is not interested in surgical options.  06/20/22- 71 yoF never smoker for sleep evaluation with concern of OSA Medical problem list includes HTN, Migraine, Asthma/ chronic cough, Allergic Rhinitis, GERD, Glaucoma, Hyperlipidemia, hx kidney stones,   Home Sleep Test 02/12/2016-AHI 31.4/hour, desaturation to 72%, body weight 164 pounds.  Oral appliance, no cpap.  -Ventolin hfa, Singulair, azelastine, Epworth score- Body weight today-158 lbs Covid vax- Flu vax- This patient was last seen 6 years ago and is coming now to reestablish at the request of Dr. Ron Parker because her oral appliance needs to be replaced.  Original  sleep study is reviewed.  She indicates this modality has been very successful and she is interested in continuing. Overall health has been stable.  She has history of nasal septal deviation, nasal stuffiness for which she uses azelastine nasal spray and occasional Afrin. Asthma is mild chronic uncomplicated well-controlled. Usual bedtime between 9 and 10 PM, sleep latency 5 minutes, waking between 1 and 4 times during the night before up between 730 and 8 AM. She will update home sleep test and then we anticipate reordering referral for oral appliance through Dr. Ron Parker.  ROS-see HPI   + = positive Constitutional:    weight loss, night sweats, fevers, chills,  fatigue, lassitude. HEENT:    +headaches, difficulty swallowing, tooth/dental problems, + sore throat,       sneezing, +itching, ear ache,  +nasal congestion,  post nasal drip, snoring CV:    chest pain, orthopnea, PND, swelling in lower extremities, anasarca,                                                         dizziness, +palpitations Resp:   + shortness of breath with exertion or at rest.                productive cough,    non-productive cough, coughing up of blood.              change in color of mucus.  wheezing.   Skin:    rash or lesions. GI:       + heartburn, indigestion, abdominal pain, nausea, vomiting, diarrhea,                 change in bowel habits, loss  of appetite GU: dysuria, change in color of urine, no urgency or frequency.   flank pain. MS:   + joint pain, +stiffness, decreased range of motion, back pain. Neuro-     nothing unusual Psych:  change in mood or affect.  depression or anxiety.   memory loss.  OBJ- Physical Exam General- Alert, Oriented, Affect-appropriate, Distress- none acute Skin- rash-none, lesions- none, excoriation- none Lymphadenopathy- none Head- atraumatic            Eyes- Gross vision intact, PERRLA, conjunctivae and secretions clear            Ears- Hearing, canals-normal             Nose-  turbinate edema-none, no-Septal dev, mucus, polyps, erosion, perforation             Throat- Mallampati III-IV , mucosa clear , drainage- none, tonsils- atrophic, +teeth Neck- flexible , trachea midline, no stridor , thyroid nl, carotid no bruit Chest - symmetrical excursion , unlabored           Heart/CV- RRR , no murmur , no gallop  , no rub, nl s1 s2                           - JVD- none , edema- none, stasis changes- none, varices- none           Lung- clear to P&A, wheeze- none, cough- none while here today , dullness-none, rub- none           Chest wall-  Abd-  Br/ Gen/ Rectal- Not done, not indicated Extrem- cyanosis- none, clubbing, none, atrophy- none, strength- nl Neuro- grossly intact to observation

## 2022-06-20 ENCOUNTER — Encounter: Payer: Self-pay | Admitting: Internal Medicine

## 2022-06-20 ENCOUNTER — Ambulatory Visit (INDEPENDENT_AMBULATORY_CARE_PROVIDER_SITE_OTHER): Payer: Medicare Other | Admitting: Internal Medicine

## 2022-06-20 VITALS — BP 122/62 | HR 76 | Ht 61.5 in | Wt 158.5 lb

## 2022-06-20 DIAGNOSIS — J3089 Other allergic rhinitis: Secondary | ICD-10-CM

## 2022-06-20 DIAGNOSIS — G4733 Obstructive sleep apnea (adult) (pediatric): Secondary | ICD-10-CM

## 2022-06-20 NOTE — Patient Instructions (Signed)
Order- schedule Home Sleep Test       dx OSA  You can call us for results 2 weeks after your sleep test  I have written new OAP script for Dr Ron Parker. If he decides he doesn't need the new sleep test, it can be cancelled.

## 2022-06-27 ENCOUNTER — Telehealth: Payer: Self-pay | Admitting: Internal Medicine

## 2022-06-27 DIAGNOSIS — G4733 Obstructive sleep apnea (adult) (pediatric): Secondary | ICD-10-CM

## 2022-06-27 NOTE — Telephone Encounter (Signed)
Dr.Katz office calling pt. Needs new ordered put in and fax to Fax [phone] for Oral sleep appliance statement for medicals nessity. Please fax to dr. office

## 2022-06-28 ENCOUNTER — Other Ambulatory Visit: Payer: Self-pay

## 2022-06-28 DIAGNOSIS — G4733 Obstructive sleep apnea (adult) (pediatric): Secondary | ICD-10-CM

## 2022-06-28 NOTE — Telephone Encounter (Signed)
Spoke to [place] from Dr Ron Parker office and she stated pt needs a new script for the oral appliance. I placed a new Referral order for the oral appliance. Nothing further needed.

## 2022-07-01 ENCOUNTER — Other Ambulatory Visit: Payer: Self-pay | Admitting: Family Medicine

## 2022-07-17 ENCOUNTER — Encounter: Payer: Self-pay | Admitting: Internal Medicine

## 2022-07-17 NOTE — Assessment & Plan Note (Signed)
She has been well-controlled using oral appliance which now needs to be replaced. Plan-schedule home sleep test.

## 2022-07-17 NOTE — Assessment & Plan Note (Signed)
Fair control with azelastine.  Seasonal variation may require additional intervention later.

## 2022-08-08 ENCOUNTER — Ambulatory Visit: Payer: Medicare Other

## 2022-08-08 DIAGNOSIS — G4733 Obstructive sleep apnea (adult) (pediatric): Secondary | ICD-10-CM | POA: Diagnosis not present

## 2022-08-11 DIAGNOSIS — G4733 Obstructive sleep apnea (adult) (pediatric): Secondary | ICD-10-CM | POA: Diagnosis not present

## 2022-08-12 ENCOUNTER — Other Ambulatory Visit: Payer: Self-pay | Admitting: Family Medicine

## 2022-08-19 ENCOUNTER — Other Ambulatory Visit: Payer: Self-pay | Admitting: Family Medicine

## 2022-08-21 ENCOUNTER — Encounter: Payer: Self-pay | Admitting: Internal Medicine

## 2022-08-29 ENCOUNTER — Encounter: Payer: Self-pay | Admitting: Family Medicine

## 2022-09-04 ENCOUNTER — Other Ambulatory Visit: Payer: Self-pay | Admitting: Family Medicine

## 2022-09-15 ENCOUNTER — Other Ambulatory Visit: Payer: Self-pay | Admitting: Family Medicine

## 2022-10-04 ENCOUNTER — Encounter: Payer: Self-pay | Admitting: Family Medicine

## 2022-10-04 ENCOUNTER — Ambulatory Visit (INDEPENDENT_AMBULATORY_CARE_PROVIDER_SITE_OTHER): Payer: Medicare Other | Admitting: Family Medicine

## 2022-10-04 VITALS — BP 110/64 | HR 57 | Temp 97.5°F | Ht 61.5 in | Wt 155.0 lb

## 2022-10-04 DIAGNOSIS — Z131 Encounter for screening for diabetes mellitus: Secondary | ICD-10-CM | POA: Diagnosis not present

## 2022-10-04 DIAGNOSIS — E785 Hyperlipidemia, unspecified: Secondary | ICD-10-CM

## 2022-10-04 DIAGNOSIS — E559 Vitamin D deficiency, unspecified: Secondary | ICD-10-CM | POA: Diagnosis not present

## 2022-10-04 DIAGNOSIS — I10 Essential (primary) hypertension: Secondary | ICD-10-CM

## 2022-10-04 DIAGNOSIS — E88819 Insulin resistance, unspecified: Secondary | ICD-10-CM

## 2022-10-04 LAB — CBC WITH DIFFERENTIAL/PLATELET
Basophils Absolute: 0 10*3/uL (ref 0.0–0.1)
Basophils Relative: 0.6 % (ref 0.0–3.0)
Eosinophils Absolute: 0.2 10*3/uL (ref 0.0–0.7)
Eosinophils Relative: 2.4 % (ref 0.0–5.0)
HCT: 42.1 % (ref 36.0–46.0)
Hemoglobin: 14.1 g/dL (ref 12.0–15.0)
Lymphocytes Relative: 33.7 % (ref 12.0–46.0)
Lymphs Abs: 2.1 10*3/uL (ref 0.7–4.0)
MCHC: 33.5 g/dL (ref 30.0–36.0)
MCV: 89.4 fl (ref 78.0–100.0)
Monocytes Absolute: 0.4 10*3/uL (ref 0.1–1.0)
Monocytes Relative: 6.9 % (ref 3.0–12.0)
Neutro Abs: 3.6 10*3/uL (ref 1.4–7.7)
Neutrophils Relative %: 56.4 % (ref 43.0–77.0)
Platelets: 225 10*3/uL (ref 150.0–400.0)
RBC: 4.71 Mil/uL (ref 3.87–5.11)
RDW: 13.1 % (ref 11.5–15.5)
WBC: 6.3 10*3/uL (ref 4.0–10.5)

## 2022-10-04 LAB — LIPID PANEL
Cholesterol: 116 mg/dL (ref 0–200)
HDL: 51.6 mg/dL (ref >39.00)
LDL Cholesterol: 48 mg/dL (ref 0–99)
NonHDL: 64.36
Total CHOL/HDL Ratio: 2
Triglycerides: 81 mg/dL (ref 0.0–149.0)
VLDL: 16.2 mg/dL (ref 0.0–40.0)

## 2022-10-04 LAB — URINALYSIS, ROUTINE W REFLEX MICROSCOPIC
Bilirubin Urine: NEGATIVE
Ketones, ur: NEGATIVE
Nitrite: NEGATIVE
Specific Gravity, Urine: 1.025 (ref 1.000–1.030)
Total Protein, Urine: NEGATIVE
Urine Glucose: NEGATIVE
Urobilinogen, UA: 0.2 (ref 0.0–1.0)
pH: 6 (ref 5.0–8.0)

## 2022-10-04 LAB — COMPREHENSIVE METABOLIC PANEL
ALT: 23 U/L (ref 0–35)
AST: 17 U/L (ref 0–37)
Albumin: 4.4 g/dL (ref 3.5–5.2)
Alkaline Phosphatase: 49 U/L (ref 39–117)
BUN: 30 mg/dL — ABNORMAL HIGH (ref 6–23)
CO2: 32 mEq/L (ref 19–32)
Calcium: 9.8 mg/dL (ref 8.4–10.5)
Chloride: 99 mEq/L (ref 96–112)
Creatinine, Ser: 0.8 mg/dL (ref 0.40–1.20)
GFR: 73.07 mL/min (ref >60.00)
Glucose, Bld: 122 mg/dL — ABNORMAL HIGH (ref 70–99)
Potassium: 3.7 mEq/L (ref 3.5–5.1)
Sodium: 140 mEq/L (ref 135–145)
Total Bilirubin: 0.6 mg/dL (ref 0.2–1.2)
Total Protein: 7.5 g/dL (ref 6.0–8.3)

## 2022-10-04 LAB — VITAMIN D 25 HYDROXY (VIT D DEFICIENCY, FRACTURES): VITD: 60.47 ng/mL (ref 30.00–100.00)

## 2022-10-04 LAB — HEMOGLOBIN A1C: Hgb A1c MFr Bld: 6.3 % (ref 4.6–6.5)

## 2022-10-04 NOTE — Patient Instructions (Addendum)
-   wants to monitor for now but if doesn't settle in regards to back pain within another 3 weeks agrees to reach out and I can refer to sports medicine   Please stop by lab before you go If you have mychart- we will send your results within 3 business days of [place] receiving them.  If you do not have mychart- we will call you about results within 5 business days of [place] receiving them.  *please also note that you will see labs on mychart as soon as they post. I will later go in and write notes on them- will say "notes from Dr. Durene Cal"   Recommended follow up: Return in about 6 months (around 04/06/2023) for followup or sooner if needed.Schedule b4 you leave.

## 2022-10-04 NOTE — Progress Notes (Signed)
Phone [phone] In person visit   Subjective:   Rhonda Spence is a 72 y.o. year old very pleasant female patient who presents for/with See problem oriented charting Chief Complaint  Patient presents with   Medical Management of Chronic Issues   Hyperlipidemia   Hypertension   tick bite    Pt got bitten by tick a couple weeks ago.   Past Medical History-  Patient Active Problem List   Diagnosis Date Noted   Low bone density 12/05/2021    Priority: Medium    Vitamin D deficiency 06/08/2018    Priority: Medium    Migraine with aura 01/11/2018    Priority: Medium    Insulin resistance 01/11/2018    Priority: Medium    Glaucoma 02/02/2016    Priority: Medium    Asthma, cough variant 02/02/2016    Priority: Medium    Obstructive sleep apnea of adult 02/02/2016    Priority: Medium    Hyperlipidemia, unspecified 02/12/2006    Priority: Medium    Essential hypertension 02/12/2006    Priority: Medium    Brittle nails 02/08/2018    Priority: Low   History of adenomatous polyp of colon 01/11/2018    Priority: Low   Hypertrophy of nasal turbinates 02/01/2017    Priority: Low   Allergic rhinitis 09/28/2016    Priority: Low   Bilateral temporomandibular joint pain 09/28/2016    Priority: Low   Seasonal and perennial allergic rhinitis 04/25/2016    Priority: Low   Age-related nuclear cataract of both eyes 01/12/2015    Priority: Low   NEPHROLITHIASIS, HX OF 02/12/2006    Priority: Low    Medications- reviewed and updated Current Outpatient Medications  Medication Sig Dispense Refill   acetaminophen (TYLENOL) 650 MG CR tablet Take 650 mg by mouth every 8 (eight) hours as needed for pain. As needed     amLODipine (NORVASC) 5 MG tablet Take 1 tablet by mouth once daily 90 tablet 0   aspirin EC 81 MG tablet Take 81 mg by mouth daily. Swallow whole.     azelastine (ASTELIN) 0.1 % nasal spray Use in each nostril as directed 90 mL 0   calcium carbonate (OS-CAL) 600 MG  TABS tablet Take 600 mg by mouth daily with breakfast.     chlorthalidone (HYGROTON) 25 MG tablet Take 1 tablet by mouth once daily 90 tablet 0   Cholecalciferol (VITAMIN D-3 PO) Take 5,400 Int'l Units/L by mouth.     Coenzyme Q10 100 MG capsule Take by mouth.     ketoconazole (NIZORAL) 2 % shampoo Apply 1 Application topically 2 (two) times a week. 120 mL 2   latanoprost (XALATAN) 0.005 % ophthalmic solution Place 1 drop into both eyes at bedtime.     losartan (COZAAR) 100 MG tablet Take 1 tablet (100 mg total) by mouth daily. 90 tablet 3   montelukast (SINGULAIR) 10 MG tablet Take 1 tablet by mouth once daily with breakfast 90 tablet 0   nebivolol (BYSTOLIC) 2.5 MG tablet Take 1 tablet by mouth once daily 90 tablet 0   potassium citrate (UROCIT-K) 10 MEQ (1080 MG) SR tablet Take 1 tablet by mouth twice daily 180 tablet 0   rosuvastatin (CRESTOR) 40 MG tablet Take 1 tablet (40 mg total) by mouth daily. TAKE 1 TABLET BY MOUTH DAILY 90 tablet 3   VENTOLIN HFA 108 (90 Base) MCG/ACT inhaler Inhale 2 puffs into the lungs every 4 (four) hours as needed for wheezing or shortness of breath.  18 g 3   No current facility-administered medications for this visit.     Objective:  BP 110/64   Pulse (!) 57   Temp (!) 97.5 F (36.4 C)   Ht 5' 1.5" (1.562 m)   Wt 155 lb (70.3 kg)   SpO2 97%   BMI 28.81 kg/m  Gen: NAD, resting comfortably CV: RRR no murmurs rubs or gallops- not bradycardic on my exam Lungs: CTAB no crackles, wheeze, rhonchi Ext: no edema Skin: warm, dry    Assessment and Plan   #social update- lost long term option for their son for group home- this fell through and a lot of stress with trying to find new solution for this.   # Tick bite S: Patient was bit by a tick a few weeks ago. Was walking with a friend on her property. Friend found ticks on her- then patient found tick 2-3 days later as thought skin tag at first). No systemic symptoms such as fever/chills/headaches. No  discomfort/itching in area.  -did bring tick today so appears to be deer tick A/P: with no systemic symptoms- no need for treatment- are is mildly eyrthematous on the lower leg and under 2 cm- gradually improving- she will let us know if flu like symptoms develop. No doxycycline allergy if needed.    #hypertension S: medication: Losartan, 100 mg; Amlodipine 5 mg; Chlorthalidone 25 mg, nebivolol 2.5 mg -no orthostatic symptoms Home readings #s: home readings can run from 120s to 150's/60's-70s occasional 80 BP Readings from Last 3 Encounters:  10/04/22 110/64. My repeat was 134/70 her cuff was at 135/78- so perhaps home readings are slightly higher than ours  06/20/22 122/62  04/20/22 116/62  A/P: blood pressure is reasonably controlled in office- continue current medications - runs slightly high at home but cuff may run slightly higher than our readings  #hyperlipidemia- peak LDL at 206 # History of lacunar infarct noted on MRI 04/14/2022-also has a 4 mm meningioma with consideration of 30-month repeat S: Medication:Rosuvastatin 40 mg, coenzyme Q 10, aspirin 81 mg Lab Results  Component Value Date   CHOL 165 12/05/2021   HDL 62.50 12/05/2021   LDLCALC 90 12/05/2021   LDLDIRECT 102.0 10/14/2020   TRIG 67.0 12/05/2021   CHOLHDL 3 12/05/2021  A/P: LDL with significant improvement from peak of over 200 but would still like under 70 if possible- update lipid panel today- could consider zetia add on if above goal  # Meningioma follow-up-scheduled October 18, 2022 thankfully   # Hyperglycemia/insulin resistance/prediabetes- peak a1c of 6.3 but most recently 6.1 in 2023 S:  Medication: none Exercise and diet- trying to remain active with gardening- not at [facility] recently- gardening daily- hour then hour off Lab Results  Component Value Date   HGBA1C 6.1 12/05/2021   HGBA1C 5.9 10/14/2020   HGBA1C 5.7 (H) 01/01/2020  A/P: hopefully stable- update a1c today. Continue without meds for now    #Vitamin D deficiency S: Medication: 54000 units per day Last vitamin D Lab Results  Component Value Date   VD25OH 53.23 12/05/2021  A/P: hopefully stable- update vitmain D today. Continue current meds for now    # Asthma-cough variant follows with Dr. Maple Hudson (oral appliance and Dr. Myrtis Ser for OSA)-.  Attempts to control allergies with Singulair, Astelin and has albuterol available if needed- stable lately- breathe right strips help some   # History of kidney stones-on potassium citrate 10 meq still  -since had lithotripsy in 2011 occasionally gets some aching in right  low back- had discussed with urologist. Had seen chiropractor in past without relief.  -last few weeks has been more bothersome and has needed more regular tylenol  (better at moment but took tylenol). Does wonder if stress from son's situation could be contributing.  - wants to monitor for now but if doesn't settle in regards to back pain within another 3 weeks agrees to reach out and I can refer to sports medicine   -update bloodwork as well  Recommended follow up: Return in about 6 months (around 04/06/2023) for followup or sooner if needed.Schedule b4 you leave. Future Appointments  Date Time Provider Department Center  10/18/2022 10:30 AM [facility] MR 3 [id number]  06/05/2023 11:15 AM [facility] ANNUAL WELLNESS VISIT 1 [facility]    Lab/Order associations:   ICD-10-CM   1. Essential hypertension  I10 Comprehensive metabolic panel    CBC with Differential/Platelet    Lipid panel    Urinalysis, Routine w reflex microscopic    2. Hyperlipidemia, unspecified hyperlipidemia type  E78.5 Comprehensive metabolic panel    CBC with Differential/Platelet    Lipid panel    Urinalysis, Routine w reflex microscopic    3. Insulin resistance  E88.819 Hemoglobin A1c    4. Screening for diabetes mellitus  Z13.1 Hemoglobin A1c    5. Vitamin D deficiency  E55.9 VITAMIN D 25 Hydroxy (Vit-D Deficiency, Fractures)      No orders of the defined types were placed in this encounter.  Return precautions advised.  Tana Conch, MD

## 2022-10-05 ENCOUNTER — Other Ambulatory Visit: Payer: Self-pay

## 2022-10-05 ENCOUNTER — Other Ambulatory Visit: Payer: Medicare Other

## 2022-10-05 DIAGNOSIS — R319 Hematuria, unspecified: Secondary | ICD-10-CM | POA: Diagnosis not present

## 2022-10-06 LAB — URINE CULTURE
MICRO NUMBER:: 15020380
SPECIMEN QUALITY:: ADEQUATE

## 2022-10-10 ENCOUNTER — Other Ambulatory Visit: Payer: Self-pay | Admitting: Family Medicine

## 2022-10-18 ENCOUNTER — Ambulatory Visit
Admission: RE | Admit: 2022-10-18 | Discharge: 2022-10-18 | Disposition: A | Discharge status: Home / Self Care | Payer: Medicare Other | Source: Ambulatory Visit | Attending: Neurology | Admitting: Neurology

## 2022-10-18 DIAGNOSIS — D329 Benign neoplasm of meninges, unspecified: Secondary | ICD-10-CM

## 2022-10-18 DIAGNOSIS — D320 Benign neoplasm of cerebral meninges: Secondary | ICD-10-CM | POA: Diagnosis not present

## 2022-10-18 MED ORDER — GADOPICLENOL 0.5 MMOL/ML IV SOLN
7.0000 mL | Freq: Once | INTRAVENOUS | Status: AC | PRN
  Administered 2022-10-18: 7 mL via INTRAVENOUS

## 2022-10-19 ENCOUNTER — Other Ambulatory Visit: Payer: Self-pay | Admitting: Family Medicine

## 2022-10-19 DIAGNOSIS — Z1231 Encounter for screening mammogram for malignant neoplasm of breast: Secondary | ICD-10-CM

## 2022-10-25 ENCOUNTER — Encounter: Payer: Self-pay | Admitting: Neurology

## 2022-10-31 DIAGNOSIS — H40023 Open angle with borderline findings, high risk, bilateral: Secondary | ICD-10-CM | POA: Diagnosis not present

## 2022-10-31 DIAGNOSIS — H2513 Age-related nuclear cataract, bilateral: Secondary | ICD-10-CM | POA: Diagnosis not present

## 2022-11-08 ENCOUNTER — Other Ambulatory Visit: Payer: Self-pay | Admitting: Family Medicine

## 2022-11-17 ENCOUNTER — Other Ambulatory Visit: Payer: Self-pay | Admitting: Family Medicine

## 2022-11-23 ENCOUNTER — Other Ambulatory Visit: Payer: Self-pay | Admitting: Family Medicine

## 2022-11-23 DIAGNOSIS — L603 Nail dystrophy: Secondary | ICD-10-CM | POA: Diagnosis not present

## 2022-11-23 DIAGNOSIS — L218 Other seborrheic dermatitis: Secondary | ICD-10-CM | POA: Diagnosis not present

## 2022-11-23 DIAGNOSIS — L750 Bromhidrosis: Secondary | ICD-10-CM | POA: Diagnosis not present

## 2022-11-23 DIAGNOSIS — L810 Postinflammatory hyperpigmentation: Secondary | ICD-10-CM | POA: Diagnosis not present

## 2022-12-04 ENCOUNTER — Ambulatory Visit: Payer: Medicare Other

## 2022-12-13 ENCOUNTER — Ambulatory Visit
Admission: RE | Admit: 2022-12-13 | Discharge: 2022-12-13 | Disposition: A | Discharge status: Home / Self Care | Payer: Medicare Other | Source: Ambulatory Visit | Attending: Family Medicine | Admitting: Family Medicine

## 2022-12-13 DIAGNOSIS — Z1231 Encounter for screening mammogram for malignant neoplasm of breast: Secondary | ICD-10-CM | POA: Diagnosis not present

## 2022-12-20 ENCOUNTER — Other Ambulatory Visit: Payer: Self-pay | Admitting: Family Medicine

## 2023-01-03 ENCOUNTER — Other Ambulatory Visit: Payer: Self-pay | Admitting: Family Medicine

## 2023-01-08 ENCOUNTER — Other Ambulatory Visit: Payer: Self-pay | Admitting: Family Medicine

## 2023-01-09 MED ORDER — POTASSIUM CITRATE ER 10 MEQ (1080 MG) PO TBCR: EXTENDED_RELEASE_TABLET | ORAL | 0 refills | Status: DC

## 2023-01-22 ENCOUNTER — Other Ambulatory Visit: Payer: Self-pay | Admitting: Family Medicine

## 2023-01-23 DIAGNOSIS — Z23 Encounter for immunization: Secondary | ICD-10-CM | POA: Diagnosis not present

## 2023-02-06 ENCOUNTER — Other Ambulatory Visit: Payer: Self-pay | Admitting: Family Medicine

## 2023-02-21 ENCOUNTER — Other Ambulatory Visit: Payer: Self-pay | Admitting: Family Medicine

## 2023-03-07 ENCOUNTER — Encounter: Payer: Self-pay | Admitting: Gastroenterology

## 2023-03-16 ENCOUNTER — Other Ambulatory Visit: Payer: Self-pay | Admitting: Family Medicine

## 2023-03-28 ENCOUNTER — Other Ambulatory Visit: Payer: Self-pay | Admitting: Family Medicine

## 2023-04-03 ENCOUNTER — Ambulatory Visit (AMBULATORY_SURGERY_CENTER): Payer: Medicare Other | Admitting: *Deleted

## 2023-04-03 ENCOUNTER — Other Ambulatory Visit: Payer: Self-pay | Admitting: Family Medicine

## 2023-04-03 VITALS — Ht 61.5 in | Wt 147.0 lb

## 2023-04-03 DIAGNOSIS — Z860100 Personal history of colon polyps, unspecified: Secondary | ICD-10-CM

## 2023-04-03 MED ORDER — NA SULFATE-K SULFATE-MG SULF 17.5-3.13-1.6 GM/177ML PO SOLN: 1.0000 | Freq: Once | ORAL | 0 refills | Status: AC

## 2023-04-03 NOTE — Progress Notes (Signed)
Pt's name and DOB verified at the beginning of the pre-visit wit 2 identifiers  Pt denies any difficulty with ambulating,sitting, laying down or rolling side to side  Pt has issues with ambulation   Pt has no issues moving head neck or swallowing  No egg or soy allergy known to patient   No issues known to pt with past sedation with any surgeries or procedures  Pt denies having issues being intubated  No FH of Malignant Hyperthermia  Pt is not on diet pills or shots  Pt is not on home 02   Pt is not on blood thinners   Pt denies issues with constipation   Pt is not on dialysis  Pt denise any abnormal heart rhythms   Pt denies any upcoming cardiac testing  Pt encouraged to use to use Singlecare or Goodrx to reduce cost   Patient's chart reviewed by Cathlyn Parsons CNRA prior to pre-visit and patient appropriate for the [facility].  Pre-visit completed and red dot placed by patient's name on their procedure day (on provider's schedule).  .  Visit by phone  Pt states weight is 147 lb  Instructed pt why it is important to and  to call if they have any changes in health or new medications. Directed them to the # given and on instructions.     Instructions reviewed. Pt given both [facility] main # and MD on call # prior to instructions.  Pt states understanding. Instructed to review again prior to procedure. Pt states they will.   Instructions sent by mail with coupon and by My Chart  Coupon sent via text to mobile phone and pt verified they received it

## 2023-04-09 ENCOUNTER — Ambulatory Visit (INDEPENDENT_AMBULATORY_CARE_PROVIDER_SITE_OTHER): Payer: Medicare Other | Admitting: Family Medicine

## 2023-04-09 ENCOUNTER — Encounter: Payer: Self-pay | Admitting: Family Medicine

## 2023-04-09 VITALS — BP 120/68 | HR 75 | Temp 98.3°F | Ht 61.5 in | Wt 149.2 lb

## 2023-04-09 DIAGNOSIS — R1010 Upper abdominal pain, unspecified: Secondary | ICD-10-CM | POA: Diagnosis not present

## 2023-04-09 DIAGNOSIS — Z131 Encounter for screening for diabetes mellitus: Secondary | ICD-10-CM | POA: Diagnosis not present

## 2023-04-09 DIAGNOSIS — I10 Essential (primary) hypertension: Secondary | ICD-10-CM

## 2023-04-09 DIAGNOSIS — E785 Hyperlipidemia, unspecified: Secondary | ICD-10-CM

## 2023-04-09 DIAGNOSIS — E88819 Insulin resistance, unspecified: Secondary | ICD-10-CM

## 2023-04-09 LAB — CBC WITH DIFFERENTIAL/PLATELET
Basophils Absolute: 0.1 10*3/uL (ref 0.0–0.1)
Basophils Relative: 0.9 % (ref 0.0–3.0)
Eosinophils Absolute: 0.2 10*3/uL (ref 0.0–0.7)
Eosinophils Relative: 2.3 % (ref 0.0–5.0)
HCT: 41.4 % (ref 36.0–46.0)
Hemoglobin: 14.2 g/dL (ref 12.0–15.0)
Lymphocytes Relative: 33.4 % (ref 12.0–46.0)
Lymphs Abs: 2.2 10*3/uL (ref 0.7–4.0)
MCHC: 34.2 g/dL (ref 30.0–36.0)
MCV: 90.5 fL (ref 78.0–100.0)
Monocytes Absolute: 0.4 10*3/uL (ref 0.1–1.0)
Monocytes Relative: 5.9 % (ref 3.0–12.0)
Neutro Abs: 3.7 10*3/uL (ref 1.4–7.7)
Neutrophils Relative %: 57.5 % (ref 43.0–77.0)
Platelets: 223 10*3/uL (ref 150.0–400.0)
RBC: 4.57 Mil/uL (ref 3.87–5.11)
RDW: 12.8 % (ref 11.5–15.5)
WBC: 6.5 10*3/uL (ref 4.0–10.5)

## 2023-04-09 LAB — COMPREHENSIVE METABOLIC PANEL
ALT: 20 U/L (ref 0–35)
AST: 16 U/L (ref 0–37)
Albumin: 4.5 g/dL (ref 3.5–5.2)
Alkaline Phosphatase: 58 U/L (ref 39–117)
BUN: 34 mg/dL — ABNORMAL HIGH (ref 6–23)
CO2: 31 meq/L (ref 19–32)
Calcium: 9.9 mg/dL (ref 8.4–10.5)
Chloride: 98 meq/L (ref 96–112)
Creatinine, Ser: 1.11 mg/dL (ref 0.40–1.20)
GFR: 49.15 mL/min — ABNORMAL LOW (ref >60.00)
Glucose, Bld: 106 mg/dL — ABNORMAL HIGH (ref 70–99)
Potassium: 3.5 meq/L (ref 3.5–5.1)
Sodium: 137 meq/L (ref 135–145)
Total Bilirubin: 0.7 mg/dL (ref 0.2–1.2)
Total Protein: 7.6 g/dL (ref 6.0–8.3)

## 2023-04-09 LAB — HEMOGLOBIN A1C: Hgb A1c MFr Bld: 6.5 % (ref 4.6–6.5)

## 2023-04-09 LAB — LIPASE: Lipase: 23 U/L (ref 11.0–59.0)

## 2023-04-09 NOTE — Progress Notes (Signed)
Phone [phone] In person visit   Subjective:   Rhonda Spence is a 72 y.o. year old very pleasant female patient who presents for/with See problem oriented charting Chief Complaint  Patient presents with   Medical Management of Chronic Issues    Colonoscopy scheduled   Hypertension   Hyperlipidemia   counseling    Pt would like referral.   Past Medical History-  Patient Active Problem List   Diagnosis Date Noted   Low bone density 12/05/2021    Priority: Medium    Vitamin D deficiency 06/08/2018    Priority: Medium    Migraine with aura 01/11/2018    Priority: Medium    Insulin resistance 01/11/2018    Priority: Medium    Glaucoma 02/02/2016    Priority: Medium    Asthma, cough variant 02/02/2016    Priority: Medium    Obstructive sleep apnea of adult 02/02/2016    Priority: Medium    Hyperlipidemia, unspecified 02/12/2006    Priority: Medium    Essential hypertension 02/12/2006    Priority: Medium    Brittle nails 02/08/2018    Priority: Low   History of adenomatous polyp of colon 01/11/2018    Priority: Low   Hypertrophy of nasal turbinates 02/01/2017    Priority: Low   Allergic rhinitis 09/28/2016    Priority: Low   Bilateral temporomandibular joint pain 09/28/2016    Priority: Low   Seasonal and perennial allergic rhinitis 04/25/2016    Priority: Low   Age-related nuclear cataract of both eyes 01/12/2015    Priority: Low   NEPHROLITHIASIS, HX OF 02/12/2006    Priority: Low    Medications- reviewed and updated Current Outpatient Medications  Medication Sig Dispense Refill   acetaminophen (TYLENOL) 650 MG CR tablet Take 650 mg by mouth every 8 (eight) hours as needed for pain. As needed     amLODipine (NORVASC) 5 MG tablet Take 1 tablet by mouth once daily 90 tablet 0   aspirin EC 81 MG tablet Take 81 mg by mouth daily. Swallow whole.     azelastine (ASTELIN) 0.1 % nasal spray Use in each nostril as directed 90 mL 0   calcium carbonate  (OS-CAL) 600 MG TABS tablet Take 600 mg by mouth daily with breakfast. 2 tablets     chlorthalidone (HYGROTON) 25 MG tablet Take 1 tablet by mouth once daily 90 tablet 0   Cholecalciferol (VITAMIN D-3 PO) Take 5,400 Int'l Units/L by mouth.     Coenzyme Q10 100 MG capsule Take by mouth.     ketoconazole (NIZORAL) 2 % shampoo APPLY TOPICALLY (SHAMPOO) TWICE A WEEK 120 mL 0   latanoprost (XALATAN) 0.005 % ophthalmic solution Place 1 drop into both eyes at bedtime.     losartan (COZAAR) 100 MG tablet Take 1 tablet (100 mg total) by mouth daily. 90 tablet 3   montelukast (SINGULAIR) 10 MG tablet Take 1 tablet by mouth once daily with breakfast 90 tablet 0   nebivolol (BYSTOLIC) 2.5 MG tablet Take 1 tablet by mouth once daily 90 tablet 0   potassium citrate (UROCIT-K) 10 MEQ (1080 MG) SR tablet Take 1 tablet by mouth twice daily 180 tablet 0   psyllium (METAMUCIL) 58.6 % packet Take 1 packet by mouth daily.     rosuvastatin (CRESTOR) 40 MG tablet Take 1 tablet by mouth once daily 90 tablet 0   VENTOLIN HFA 108 (90 Base) MCG/ACT inhaler Inhale 2 puffs into the lungs every 4 (four) hours as needed for  wheezing or shortness of breath. 18 g 3   No current facility-administered medications for this visit.     Objective:  BP 120/68   Pulse 75   Temp 98.3 F (36.8 C)   Ht 5' 1.5" (1.562 m)   Wt 149 lb 3.2 oz (67.7 kg)   SpO2 97%   BMI 27.73 kg/m  Gen: NAD, resting comfortably CV: RRR no murmurs rubs or gallops Lungs: CTAB no crackles, wheeze, rhonchi Ext: no edema Skin: warm, dry     Assessment and Plan   # Stress management/marital stress S:last visit patient mentioned losing long term option for their son for group home nad having a lot of stress with that. Today reports that situation is some better.  - still ongoing marital stress and husband has declined counseling.  - also lost father figure in June, close friend since age 16 lost recently as well  -we mentioned singulair can cause  some down mood -with stress she feels a tightness in stomach and nausea- she is feeling that almost all the time. At least a yar of symptoms- no worse- was occurring at time of may labs for instance A/P: high stress/anxiety- wants to start with therapy over medicine. gave several names- Burgess Estelle, Dayle Points, Coral Spikes, and she had seen a prior therapist -also hopeful husband would agree to couples counseling  As far as abdominal discomfort- wants to hold off on gastroenterology consult but is having upcoming colonoscopy and encouraged her to run this by them - I will also order CBC, CMP, lipase -she wants to retrial famotidine as well  #hypertension S: medication: Losartan, 100 mg; Amlodipine 5 mg; Chlorthalidone 25 mg -Hold nebivolol 2.5 mg in mid 2023 due to orthostatic symptoms- had to restart dec 2023 Home readings #s: no recent checks BP Readings from Last 3 Encounters:  04/09/23 120/68  10/04/22 110/64  06/20/22 122/62  A/P: stable- continue current medicines   #hyperlipidemia- peak LDL at 206 # History of lacunar infarct noted on MRI - on aspirin as result S: Medication:Rosuvastatin 40 mg, coenzyme Q 10, aspirin 81 mg Lab Results  Component Value Date   CHOL 116 10/04/2022   HDL 51.60 10/04/2022   LDLCALC 48 10/04/2022   LDLDIRECT 102.0 10/14/2020   TRIG 81.0 10/04/2022   CHOLHDL 2 10/04/2022  A/P: phenomenal control last visit- continue current medications   # Hyperglycemia/insulin resistance/prediabetes- peak a1c of 6.3 but most recently 6.1 in 2023 S:  Medication: none Lab Results  Component Value Date   HGBA1C 6.3 10/04/2022   HGBA1C 6.1 12/05/2021   HGBA1C 5.9 10/14/2020  A/P: hopefully stable- update a1c today. Continue without meds for now   # History of kidney stones-on potassium citrate 10 meq still   # Orthopedic Concerns-follows with [facility]-has had knee pain issues in the past responding to physical therapy   # Meningioma  follow-up-scheduled October 18, 2022 thankfully   Recommended follow up: Return in about 6 months (around 10/08/2023) for followup or sooner if needed.Schedule b4 you leave. Future Appointments  Date Time Provider Department Center  04/16/2023  1:30 PM Meryl Dare, MD [facility]  06/05/2023 11:20 AM [facility] ANNUAL WELLNESS VISIT 1 [facility]    Lab/Order associations:   ICD-10-CM   1. Essential hypertension  I10     2. Hyperlipidemia, unspecified hyperlipidemia type  E78.5 Comprehensive metabolic panel    CBC with Differential/Platelet    3. Insulin resistance  E88.819 Hemoglobin A1c    4. Screening for  diabetes mellitus  Z13.1 Hemoglobin A1c    5. Upper abdominal pain  R10.10 Comprehensive metabolic panel    CBC with Differential/Platelet    Lipase      No orders of the defined types were placed in this encounter.   Return precautions advised.  Tana Conch, MD

## 2023-04-09 NOTE — Patient Instructions (Addendum)
Please stop by lab before you go If you have mychart- we will send your results within 3 business days of [place] receiving them.  If you do not have mychart- we will call you about results within 5 business days of [place] receiving them.  *please also note that you will see labs on mychart as soon as they post. I will later go in and write notes on them- will say "notes from Dr. Durene Cal"   If new or worsening symptoms please let me know- also would recommend mentioning abdominal issues to Dr. Russella Dar -also reasonable to retrial famotidine to see if makes any difference  Several options given for therapist  Recommended follow up: Return in about 6 months (around 10/08/2023) for followup or sooner if needed.Schedule b4 you leave.

## 2023-04-12 ENCOUNTER — Telehealth: Payer: Self-pay | Admitting: Gastroenterology

## 2023-04-12 NOTE — Telephone Encounter (Signed)
Inbound call from patient stating that she has questions regarding her prep instructions for procedure on 12/9. Please advise.

## 2023-04-12 NOTE — Telephone Encounter (Signed)
Clarified instructions with pt.Pt states she understood the instructions and has no other questions at this time.

## 2023-04-16 ENCOUNTER — Encounter: Payer: Self-pay | Admitting: Gastroenterology

## 2023-04-16 ENCOUNTER — Ambulatory Visit: Payer: Medicare Other | Admitting: Gastroenterology

## 2023-04-16 VITALS — BP 103/66 | HR 59 | Temp 97.2°F | Resp 14 | Ht 61.5 in | Wt 147.0 lb

## 2023-04-16 DIAGNOSIS — Z1211 Encounter for screening for malignant neoplasm of colon: Secondary | ICD-10-CM

## 2023-04-16 DIAGNOSIS — K5730 Diverticulosis of large intestine without perforation or abscess without bleeding: Secondary | ICD-10-CM

## 2023-04-16 DIAGNOSIS — Z860101 Personal history of adenomatous and serrated colon polyps: Secondary | ICD-10-CM | POA: Diagnosis not present

## 2023-04-16 DIAGNOSIS — K5732 Diverticulitis of large intestine without perforation or abscess without bleeding: Secondary | ICD-10-CM

## 2023-04-16 DIAGNOSIS — I10 Essential (primary) hypertension: Secondary | ICD-10-CM | POA: Diagnosis not present

## 2023-04-16 DIAGNOSIS — E669 Obesity, unspecified: Secondary | ICD-10-CM | POA: Diagnosis not present

## 2023-04-16 DIAGNOSIS — G4733 Obstructive sleep apnea (adult) (pediatric): Secondary | ICD-10-CM | POA: Diagnosis not present

## 2023-04-16 DIAGNOSIS — D123 Benign neoplasm of transverse colon: Secondary | ICD-10-CM | POA: Diagnosis not present

## 2023-04-16 MED ORDER — SODIUM CHLORIDE 0.9 % IV SOLN: 500.0000 mL | Freq: Once | INTRAVENOUS | Status: DC

## 2023-04-16 MED ORDER — AMOXICILLIN-POT CLAVULANATE 875-125 MG PO TABS: 1.0000 | ORAL_TABLET | Freq: Two times a day (BID) | ORAL | 0 refills | Status: DC

## 2023-04-16 NOTE — Patient Instructions (Signed)
Resume all of your previous medications today as ordered.  Take your Augmentin as ordered.  Read your discharge instructions.  YOU HAD AN ENDOSCOPIC PROCEDURE TODAY AT THE [facility]:   Refer to the procedure report that was given to you for any specific questions about what was found during the examination.  If the procedure report does not answer your questions, please call your gastroenterologist to clarify.  If you requested that your care partner not be given the details of your procedure findings, then the procedure report has been included in a sealed envelope for you to review at your convenience later.  YOU SHOULD EXPECT: Some feelings of bloating in the abdomen. Passage of more gas than usual.  Walking can help get rid of the air that was put into your GI tract during the procedure and reduce the bloating. If you had a lower endoscopy (such as a colonoscopy or flexible sigmoidoscopy) you may notice spotting of blood in your stool or on the toilet paper. If you underwent a bowel prep for your procedure, you may not have a normal bowel movement for a few days.  Please Note:  You might notice some irritation and congestion in your nose or some drainage.  This is from the oxygen used during your procedure.  There is no need for concern and it should clear up in a day or so.  SYMPTOMS TO REPORT IMMEDIATELY:  Following lower endoscopy (colonoscopy or flexible sigmoidoscopy):  Excessive amounts of blood in the stool  Significant tenderness or worsening of abdominal pains  Swelling of the abdomen that is new, acute  Fever of 100F or higher    For urgent or emergent issues, a gastroenterologist can be reached at any hour by calling [phone]. Do not use MyChart messaging for urgent concerns.    DIET:  We do recommend a small meal at first, but then you may proceed to your regular diet.  Drink plenty of fluids but you should avoid alcoholic beverages for 24 hours. Be sure to  eat fiber and drink plenty of water.  ACTIVITY:  You should plan to take it easy for the rest of today and you should NOT DRIVE or use heavy machinery until tomorrow (because of the sedation medicines used during the test).    FOLLOW UP: Our staff will call the number listed on your records the next business day following your procedure.  We will call around 7:15- 8:00 am to check on you and address any questions or concerns that you may have regarding the information given to you following your procedure. If we do not reach you, we will leave a message.     If any biopsies were taken you will be contacted by phone or by letter within the next 1-3 weeks.  Please call us at [phone] if you have not heard about the biopsies in 3 weeks.    SIGNATURES/CONFIDENTIALITY: You and/or your care partner have signed paperwork which will be entered into your electronic medical record.  These signatures attest to the fact that that the information above on your After Visit Summary has been reviewed and is understood.  Full responsibility of the confidentiality of this discharge information lies with you and/or your care-partner.

## 2023-04-16 NOTE — Progress Notes (Signed)
Pt's states no medical or surgical changes since previsit or office visit. 

## 2023-04-16 NOTE — Progress Notes (Signed)
Report to PACU, RN, vss, BBS= Clear.  

## 2023-04-16 NOTE — Progress Notes (Signed)
History & Physical  Primary Care Physician:  Shelva Majestic, MD Primary Gastroenterologist: Claudette Head, MD  Impression / Plan:  Personal history of adenomatous colon polyps for surveillance colonoscopy.  CHIEF COMPLAINT:  Personal history of colon polyps   HPI: Rhonda Spence is a 72 y.o. female with a personal history of adenomatous colon polyps for surveillance colonoscopy.    Past Medical History:  Diagnosis Date   Allergy    Anemia    Asthma    Cataract    Chronic kidney disease    stones   GERD (gastroesophageal reflux disease)    Glaucoma    Hyperlipidemia    Hypertension    Nephrolithiasis    has required lithotripsy   Osteoporosis    Sleep apnea    wears an oral appliance    Past Surgical History:  Procedure Laterality Date   ADENOIDECTOMY     BREAST BIOPSY Left 07/09/2009   benign x2   BREAST BIOPSY     CESAREAN SECTION     x2   COLONOSCOPY     LITHOTRIPSY  2011   k[facility]rough   POLYPECTOMY     TONSILLECTOMY     widom teeth extraction  1976    Prior to Admission medications   Medication Sig Start Date End Date Taking? Authorizing Provider  acetaminophen (TYLENOL) 650 MG CR tablet Take 650 mg by mouth every 8 (eight) hours as needed for pain. As needed   Yes [provider]  amLODipine (NORVASC) 5 MG tablet Take 1 tablet by mouth once daily 03/16/23  Yes Shelva Majestic, MD  aspirin EC 81 MG tablet Take 81 mg by mouth daily. Swallow whole.   Yes [provider]  calcium carbonate (OS-CAL) 600 MG TABS tablet Take 600 mg by mouth daily with breakfast. 2 tablets   Yes [provider]  chlorthalidone (HYGROTON) 25 MG tablet Take 1 tablet by mouth once daily 02/22/23  Yes Shelva Majestic, MD  Cholecalciferol (VITAMIN D-3 PO) Take 5,400 Int'l Units/L by mouth.   Yes [provider]  Coenzyme Q10 100 MG capsule Take by mouth.   Yes [provider]  ketoconazole (NIZORAL) 2 % shampoo APPLY TOPICALLY  (SHAMPOO) TWICE A WEEK 01/23/23  Yes Shelva Majestic, MD  latanoprost (XALATAN) 0.005 % ophthalmic solution Place 1 drop into both eyes at bedtime.   Yes [provider]  losartan (COZAAR) 100 MG tablet Take 1 tablet (100 mg total) by mouth daily. 06/06/22  Yes Shelva Majestic, MD  montelukast (SINGULAIR) 10 MG tablet Take 1 tablet by mouth once daily with breakfast 02/22/23  Yes Shelva Majestic, MD  nebivolol (BYSTOLIC) 2.5 MG tablet Take 1 tablet by mouth once daily 02/06/23  Yes Shelva Majestic, MD  potassium citrate (UROCIT-K) 10 MEQ (1080 MG) SR tablet Take 1 tablet by mouth twice daily 04/03/23  Yes Shelva Majestic, MD  psyllium (METAMUCIL) 58.6 % packet Take 1 packet by mouth daily.   Yes [provider]  rosuvastatin (CRESTOR) 40 MG tablet Take 1 tablet by mouth once daily 03/28/23  Yes Shelva Majestic, MD  azelastine (ASTELIN) 0.1 % nasal spray Use in each nostril as directed 09/28/21   Shelva Majestic, MD  VENTOLIN HFA 108 (90 Base) MCG/ACT inhaler Inhale 2 puffs into the lungs every 4 (four) hours as needed for wheezing or shortness of breath. 01/05/20   Shelva Majestic, MD    Current Outpatient Medications  Medication  Sig Dispense Refill   acetaminophen (TYLENOL) 650 MG CR tablet Take 650 mg by mouth every 8 (eight) hours as needed for pain. As needed     amLODipine (NORVASC) 5 MG tablet Take 1 tablet by mouth once daily 90 tablet 0   aspirin EC 81 MG tablet Take 81 mg by mouth daily. Swallow whole.     calcium carbonate (OS-CAL) 600 MG TABS tablet Take 600 mg by mouth daily with breakfast. 2 tablets     chlorthalidone (HYGROTON) 25 MG tablet Take 1 tablet by mouth once daily 90 tablet 0   Cholecalciferol (VITAMIN D-3 PO) Take 5,400 Int'l Units/L by mouth.     Coenzyme Q10 100 MG capsule Take by mouth.     ketoconazole (NIZORAL) 2 % shampoo APPLY TOPICALLY (SHAMPOO) TWICE A WEEK 120 mL 0   latanoprost (XALATAN) 0.005 % ophthalmic solution Place 1 drop  into both eyes at bedtime.     losartan (COZAAR) 100 MG tablet Take 1 tablet (100 mg total) by mouth daily. 90 tablet 3   montelukast (SINGULAIR) 10 MG tablet Take 1 tablet by mouth once daily with breakfast 90 tablet 0   nebivolol (BYSTOLIC) 2.5 MG tablet Take 1 tablet by mouth once daily 90 tablet 0   potassium citrate (UROCIT-K) 10 MEQ (1080 MG) SR tablet Take 1 tablet by mouth twice daily 180 tablet 0   psyllium (METAMUCIL) 58.6 % packet Take 1 packet by mouth daily.     rosuvastatin (CRESTOR) 40 MG tablet Take 1 tablet by mouth once daily 90 tablet 0   azelastine (ASTELIN) 0.1 % nasal spray Use in each nostril as directed 90 mL 0   VENTOLIN HFA 108 (90 Base) MCG/ACT inhaler Inhale 2 puffs into the lungs every 4 (four) hours as needed for wheezing or shortness of breath. 18 g 3   Current Facility-Administered Medications  Medication Dose Route Frequency Provider Last Rate Last Admin   0.9 %  sodium chloride infusion  500 mL Intravenous Once Meryl Dare, MD        Allergies as of 04/16/2023 - Review Complete 04/16/2023  Allergen Reaction Noted   Iodine  05/15/2006   Lipitor [atorvastatin calcium]  01/11/2018    Family History  Problem Relation Age of Onset   Hypertension Mother    Heart failure Mother    Asthma Mother    COPD Mother        led to death early 19s   Heart disease Father    Heart failure Father        related to osteogenesis imperfecta. died at 64   Osteogenesis imperfecta Father    Osteogenesis imperfecta Sister        possibly related to this- led to death   Breast cancer Sister        half sister   Stroke Brother    Healthy Brother    Breast cancer Maternal Aunt    Obesity Maternal Grandmother    Hyperlipidemia Paternal Grandmother    Hypertension Paternal Grandmother    Hyperlipidemia Son    Migraines Son    Hyperlipidemia Son    Glaucoma Son    Colon cancer Neg Hx    Colon polyps Neg Hx    Esophageal cancer Neg Hx    Stomach cancer Neg Hx     Rectal cancer Neg Hx     Social History   Socioeconomic History   Marital status: Married    Spouse name: Not on file  Number of children: Not on file   Years of education: Not on file   Highest education level: Not on file  Occupational History   Occupation: retired    Occupation: housewife  Tobacco Use   Smoking status: Never   Smokeless tobacco: Never  Vaping Use   Vaping status: Never Used  Substance and Sexual Activity   Alcohol use: Not Currently    Alcohol/week: 0.0 - 1.0 standard drinks of alcohol   Drug use: No   Sexual activity: Yes    Birth control/protection: Post-menopausal  Other Topics Concern   Not on file  Social History Narrative   Married (husband Peyton Najjar patient of Dr. Durene Cal ). Son 65 with CP in group home, son 37 lives in [place]. 2 grandkids in [place] 11 and 8 in 2019 (not expecting more).       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 Book club   Social Determinants of Health   Financial Resource Strain: Low Risk  (05/17/2021)   Overall Financial Resource Strain (CARDIA)    Difficulty of Paying Living Expenses: Not hard at all  Food Insecurity: No Food Insecurity (05/17/2021)   Hunger Vital Sign    Worried About Running Out of Food in the Last Year: Never true    Ran Out of Food in the Last Year: Never true  Transportation Needs: No Transportation Needs (05/17/2021)   PRAPARE - Administrator, Civil Service (Medical): No    Lack of Transportation (Non-Medical): No  Physical Activity: Inactive (05/17/2021)   Exercise Vital Sign    Days of Exercise per Week: 0 days    Minutes of Exercise per Session: 0 min  Stress: Stress Concern Present (05/29/2022)   [employer] of Occupational Health - Occupational  Stress Questionnaire    Feeling of Stress : To some extent  Social Connections: Unknown (05/29/2022)   Social Connection and Isolation Panel [NHANES]    Frequency of Communication with Friends and Family: Not on file    Frequency of Social Gatherings with Friends and Family: Not on file    Attends Religious Services: More than 4 times per year    Active Member of [employer] or Organizations: Not on file    Attends Banker Meetings: Not on file    Marital Status: Not on file  Intimate Partner Violence: Not At Risk (05/29/2022)   Humiliation, Afraid, Rape, and Kick questionnaire    Fear of Current or Ex-Partner: No    Emotionally Abused: No    Physically Abused: No    Sexually Abused: No    Review of Systems:  All systems reviewed were negative except where noted in HPI.   Physical Exam:  General:  Alert, well-developed, in NAD Head:  Normocephalic and atraumatic. Eyes:  Sclera clear, no icterus.   Conjunctiva pink. Ears:  Normal auditory acuity. Mouth:  No deformity or lesions.  Neck:  Supple; no masses. Lungs:  Clear throughout to auscultation.   No wheezes, crackles, or rhonchi.  Heart:  Regular rate and rhythm; no murmurs. Abdomen:  Soft, nondistended, nontender. No masses, hepatomegaly. No palpable masses.  Normal bowel sounds.    Rectal:  Deferred   Msk:  Symmetrical without gross deformities. Extremities:  Without edema. Neurologic:  Alert and  oriented  x 4; grossly normal neurologically. Skin:  Intact without significant lesions or rashes. Psych:  Alert and cooperative. Normal mood and affect.   Venita Lick. Russella Dar  04/16/2023, 1:20 PM See [facility], [facility], to contact our on call provider

## 2023-04-16 NOTE — Op Note (Signed)
[facility] Patient Name: Rhonda Spence Procedure Date: 04/16/2023 1:20 PM MRN: [id number] Endoscopist: Meryl Dare , MD, [phone] Age: 72 Referring MD:  Date of Birth: 1950-06-23 Gender: Female Account #: [id number] Procedure:                Colonoscopy Indications:              Surveillance: Personal history of adenomatous                            polyps on last colonoscopy 5 years ago Medicines:                Monitored Anesthesia Care Procedure:                Pre-Anesthesia Assessment:                           - Prior to the procedure, a History and Physical                            was performed, and patient medications and                            allergies were reviewed. The patient's tolerance of                            previous anesthesia was also reviewed. The risks                            and benefits of the procedure and the sedation                            options and risks were discussed with the patient.                            All questions were answered, and informed consent                            was obtained. Prior Anticoagulants: The patient has                            taken no anticoagulant or antiplatelet agents. ASA                            Grade Assessment: II - A patient with mild systemic                            disease. After reviewing the risks and benefits,                            the patient was deemed in satisfactory condition to                            undergo the procedure.  After obtaining informed consent, the colonoscope                            was passed under direct vision. Throughout the                            procedure, the patient's blood pressure, pulse, and                            oxygen saturations were monitored continuously. The                            Olympus Scope SN [id number] was introduced through the                            anus and advanced  to the the cecum, identified by                            appendiceal orifice and ileocecal valve. The                            ileocecal valve, appendiceal orifice, and rectum                            were photographed. The quality of the bowel                            preparation was good. The colonoscopy was performed                            without difficulty. The patient tolerated the                            procedure well. Scope In: 1:27:18 PM Scope Out: 1:39:23 PM Scope Withdrawal Time: 0 hours 9 minutes 50 seconds  Total Procedure Duration: 0 hours 12 minutes 5 seconds  Findings:                 The perianal and digital rectal examinations were                            normal.                           Three sessile polyps were found in the transverse                            colon. The polyps were 5 to 8 mm in size. These                            polyps were removed with a cold snare. Resection                            and retrieval were complete.  Multiple large-mouthed, medium-mouthed and                            small-mouthed diverticula were found in the entire                            colon. Purulent discharge was seen in association                            with the diverticular opening, suspicious of                            diverticulitis. There was no evidence of                            diverticular bleeding.                           The exam was otherwise without abnormality on                            direct and retroflexion views. Complications:            No immediate complications. Estimated blood loss:                            None. Estimated Blood Loss:     Estimated blood loss: none. Impression:               - Three 5 to 8 mm polyps in the transverse colon,                            removed with a cold snare. Resected and retrieved.                           - Moderate diverticulosis in the entire  examined                            colon. Purulent discharge was seen in association                            with a couple diverticular openings, suspicious of                            diverticulitis.                           - The examination was otherwise normal on direct                            and retroflexion views. Recommendation:           - Repeat colonoscopy vs no repeat due to age after                            studies are complete for surveillance based on  pathology results.                           - Patient has a contact number available for                            emergencies. The signs and symptoms of potential                            delayed complications were discussed with the                            patient. Return to normal activities tomorrow.                            Written discharge instructions were provided to the                            patient.                           - Resume previous diet adding high fiber.                           - Continue present medications.                           - Augmentin 875 mg po bid with food, #10.                           - Await pathology results. Meryl Dare, MD 04/16/2023 1:45:21 PM This report has been signed electronically.

## 2023-04-16 NOTE — Progress Notes (Signed)
Called to room to assist during endoscopic procedure.  Patient ID and intended procedure confirmed with present staff. Received instructions for my participation in the procedure from the performing physician.  

## 2023-04-17 ENCOUNTER — Telehealth: Payer: Self-pay

## 2023-04-17 DIAGNOSIS — H40023 Open angle with borderline findings, high risk, bilateral: Secondary | ICD-10-CM | POA: Diagnosis not present

## 2023-04-17 NOTE — Telephone Encounter (Signed)
Follow up call to pt, lm for pt to call if having any difficulty with normal activities or eating and drinking.  Also to call if any other questions or concerns.  

## 2023-04-19 LAB — SURGICAL PATHOLOGY

## 2023-04-30 ENCOUNTER — Encounter: Payer: Self-pay | Admitting: Gastroenterology

## 2023-05-12 ENCOUNTER — Other Ambulatory Visit: Payer: Self-pay | Admitting: Family Medicine

## 2023-05-16 ENCOUNTER — Other Ambulatory Visit: Payer: Self-pay | Admitting: Family Medicine

## 2023-05-25 ENCOUNTER — Other Ambulatory Visit: Payer: Self-pay | Admitting: Family Medicine

## 2023-06-04 ENCOUNTER — Other Ambulatory Visit: Payer: Self-pay | Admitting: Family Medicine

## 2023-06-05 ENCOUNTER — Ambulatory Visit (INDEPENDENT_AMBULATORY_CARE_PROVIDER_SITE_OTHER): Payer: Medicare Other

## 2023-06-05 VITALS — Ht 61.0 in | Wt 147.0 lb

## 2023-06-05 DIAGNOSIS — Z0000 Encounter for general adult medical examination without abnormal findings: Secondary | ICD-10-CM

## 2023-06-05 NOTE — Progress Notes (Signed)
Subjective:   Rhonda Spence is a 73 y.o. female who presents for Medicare Annual (Subsequent) preventive examination.  Visit Complete: Virtual I connected with  Wyman Songster Glandon on 06/05/23 by a audio enabled telemedicine application and verified that I am speaking with the correct person using two identifiers.  Patient Location: Home  Provider Location: Office/Clinic  I discussed the limitations of evaluation and management by telemedicine. The patient expressed understanding and agreed to proceed.  Vital Signs: Because this visit was a virtual/telehealth visit, some criteria may be missing or patient reported. Any vitals not documented were not able to be obtained and vitals that have been documented are patient reported. Cardiac Risk Factors include: advanced age (>25men, >54 women);dyslipidemia;hypertension     Objective:    Today's Vitals   06/05/23 1120  Weight: 147 lb (66.7 kg)  Height: 5\' 1"  (1.549 m)   Body mass index is 27.78 kg/m.     06/05/2023   11:35 AM 05/29/2022   11:32 AM 03/29/2022   12:47 PM 05/17/2021   11:18 AM 04/26/2020   10:42 AM 02/28/2019    1:26 PM  Advanced Directives  Does Patient Have a Medical Advance Directive? Yes Yes Yes Yes Yes Yes  Type of Estate agent of [place];Living will Healthcare Power of [place];Living will Healthcare Power of [place];Living will Living will Living will;Healthcare Power of [employer] Power of [place];Living will  Does patient want to make changes to medical advance directive?      Yes (MAU/Ambulatory/Procedural Areas - Information given)  Copy of Healthcare Power of Attorney in Chart? No - copy requested No - copy requested   No - copy requested No - copy requested    Current Medications (verified) Outpatient Encounter Medications as of 06/05/2023  Medication Sig   acetaminophen (TYLENOL) 650 MG CR tablet Take 650 mg by mouth every 8 (eight) hours as needed for pain. As  needed   amLODipine (NORVASC) 5 MG tablet Take 1 tablet by mouth once daily   aspirin EC 81 MG tablet Take 81 mg by mouth daily. Swallow whole.   azelastine (ASTELIN) 0.1 % nasal spray Use in each nostril as directed   calcium carbonate (OS-CAL) 600 MG TABS tablet Take 600 mg by mouth daily with breakfast. 2 tablets   chlorthalidone (HYGROTON) 25 MG tablet Take 1 tablet by mouth once daily   Cholecalciferol (VITAMIN D-3 PO) Take 5,400 Int'l Units/L by mouth.   Coenzyme Q10 100 MG capsule Take by mouth.   famotidine (PEPCID) 20 MG tablet Take 20 mg by mouth 1 day or 1 dose.   ketoconazole (NIZORAL) 2 % shampoo APPLY TOPICALLY (SHAMPOO) TWICE A WEEK   latanoprost (XALATAN) 0.005 % ophthalmic solution Place 1 drop into both eyes at bedtime.   losartan (COZAAR) 100 MG tablet Take 1 tablet by mouth once daily   montelukast (SINGULAIR) 10 MG tablet Take 1 tablet by mouth once daily with breakfast   nebivolol (BYSTOLIC) 2.5 MG tablet Take 1 tablet by mouth once daily   potassium citrate (UROCIT-K) 10 MEQ (1080 MG) SR tablet Take 1 tablet by mouth twice daily   psyllium (METAMUCIL) 58.6 % packet Take 1 packet by mouth daily.   rosuvastatin (CRESTOR) 40 MG tablet Take 1 tablet by mouth once daily   VENTOLIN HFA 108 (90 Base) MCG/ACT inhaler Inhale 2 puffs into the lungs every 4 (four) hours as needed for wheezing or shortness of breath.   [DISCONTINUED] amoxicillin-clavulanate (AUGMENTIN) 875-125 MG tablet Take  1 tablet by mouth 2 (two) times daily.   No facility-administered encounter medications on file as of 06/05/2023.    Allergies (verified) Iodine and Lipitor [atorvastatin calcium]   History: Past Medical History:  Diagnosis Date   Allergy    Anemia    Asthma    Cataract    Chronic kidney disease    stones   GERD (gastroesophageal reflux disease)    Glaucoma    Hyperlipidemia    Hypertension    Nephrolithiasis    has required lithotripsy   Osteoporosis    Sleep apnea    wears  an oral appliance   Past Surgical History:  Procedure Laterality Date   ADENOIDECTOMY     BREAST BIOPSY Left 07/09/2009   benign x2   BREAST BIOPSY     CESAREAN SECTION     x2   COLONOSCOPY     LITHOTRIPSY  2011   kimbrough   POLYPECTOMY     TONSILLECTOMY     widom teeth extraction  1976   Family History  Problem Relation Age of Onset   Hypertension Mother    Heart failure Mother    Asthma Mother    COPD Mother        led to death early 20s   Heart disease Father    Heart failure Father        related to osteogenesis imperfecta. died at 17   Osteogenesis imperfecta Father    Osteogenesis imperfecta Sister        possibly related to this- led to death   Breast cancer Sister        half sister   Stroke Brother    Healthy Brother    Breast cancer Maternal Aunt    Obesity Maternal Grandmother    Hyperlipidemia Paternal Grandmother    Hypertension Paternal Grandmother    Hyperlipidemia Son    Migraines Son    Hyperlipidemia Son    Glaucoma Son    Colon cancer Neg Hx    Colon polyps Neg Hx    Esophageal cancer Neg Hx    Stomach cancer Neg Hx    Rectal cancer Neg Hx    Social History   Socioeconomic History   Marital status: Married    Spouse name: Not on file   Number of children: Not on file   Years of education: Not on file   Highest education level: Not on file  Occupational History   Occupation: retired    Occupation: housewife  Tobacco Use   Smoking status: Never   Smokeless tobacco: Never  Vaping Use   Vaping status: Never Used  Substance and Sexual Activity   Alcohol use: Not Currently    Alcohol/week: 0.0 - 1.0 standard drinks of alcohol   Drug use: No   Sexual activity: Yes    Birth control/protection: Post-menopausal  Other Topics Concern   Not on file  Social History Narrative   Married (husband Peyton Najjar patient of Dr. Durene Cal ). Son 73 with CP in group home, son 50 lives in [place]. 2 grandkids in [place] 11 and 8 in 2019 (not  expecting more).       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 Book club   Social Drivers of Corporate investment banker Strain:  Low Risk  (06/05/2023)   Overall Financial Resource Strain (CARDIA)    Difficulty of Paying Living Expenses: Not hard at all  Food Insecurity: No Food Insecurity (06/05/2023)   Hunger Vital Sign    Worried About Running Out of Food in the Last Year: Never true    Ran Out of Food in the Last Year: Never true  Transportation Needs: No Transportation Needs (06/05/2023)   PRAPARE - Administrator, Civil Service (Medical): No    Lack of Transportation (Non-Medical): No  Physical Activity: Inactive (06/05/2023)   Exercise Vital Sign    Days of Exercise per Week: 0 days    Minutes of Exercise per Session: 0 min  Stress: Stress Concern Present (06/05/2023)   [employer] of Occupational Health - Occupational Stress Questionnaire    Feeling of Stress : Rather much  Social Connections: Socially Integrated (06/05/2023)   Social Connection and Isolation Panel [NHANES]    Frequency of Communication with Friends and Family: More than three times a week    Frequency of Social Gatherings with Friends and Family: More than three times a week    Attends Religious Services: More than 4 times per year    Active Member of [employer] or Organizations: Yes    Attends Engineer, structural: More than 4 times per year    Marital Status: Married    Tobacco Counseling Counseling given: Not Answered   Clinical Intake:  Pre-visit preparation completed: Yes  Pain : No/denies pain     BMI - recorded: 27.28 Nutritional Status: BMI 25 -29 Overweight Diabetes: No  How often do you need to have someone help you when you read instructions,  pamphlets, or other written materials from your doctor or pharmacy?: 1 - Never  Interpreter Needed?: No  Information entered by :: Hassell Halim, CMA   Activities of Daily Living    06/05/2023   12:15 PM  In your present state of health, do you have any difficulty performing the following activities:  Hearing? 0  Vision? 0  Difficulty concentrating or making decisions? 0  Walking or climbing stairs? 0  Dressing or bathing? 0  Doing errands, shopping? 0  Preparing Food and eating ? N  Using the Toilet? N  In the past six months, have you accidently leaked urine? N  Do you have problems with loss of bowel control? N  Managing your Medications? N  Managing your Finances? N  Housekeeping or managing your Housekeeping? N    Patient Care Team: Shelva Majestic, MD as PCP - General (Family Medicine) Bettye Boeck, MD as Consulting Physician (Ophthalmology) Larey Dresser, DPM as Consulting Physician (Podiatry) Delfin Gant, MD as Attending Physician (Family Medicine) Dahlia Byes, [facility] (Inactive) as Pharmacist (Pharmacist)  Indicate any recent Medical Services you may have received from other than [facility] providers in the past year (date may be approximate).     Assessment:   This is a routine wellness examination for Rhonda Spence.  Hearing/Vision screen Hearing Screening - Comments:: No concerns with hearing per patient Vision Screening - Comments:: Sees Dr [facility]res [facility]   Goals Addressed               This Visit's Progress     Patient Stated (pt-stated)        Patient stated wanted to get on a exercise routine       Depression Screen    06/05/2023   11:31 AM 10/04/2022    8:05  AM 05/29/2022   11:28 AM 03/20/2022    2:45 PM 05/17/2021   11:17 AM 10/14/2020    9:52 AM 04/26/2020   10:36 AM  PHQ 2/9 Scores  PHQ - 2 Score 0 2 0 0 0 0 0  PHQ- 9 Score  5         Fall Risk    06/05/2023   11:36 AM 10/04/2022    8:05 AM 05/29/2022   10:34 AM 03/29/2022    12:47 PM 03/20/2022    2:45 PM  Fall Risk   Falls in the past year? 0 0 0 0 0  Number falls in past yr: 0 0 0 0 0  Injury with Fall? 0 0 0 0 0  Risk for fall due to : No Fall Risks No Fall Risks Impaired vision    Follow up Falls prevention discussed Falls evaluation completed Falls prevention discussed Falls evaluation completed     MEDICARE RISK AT HOME: Medicare Risk at Home Any stairs in or around the home?: Yes If so, are there any without handrails?: No Home free of loose throw rugs in walkways, pet beds, electrical cords, etc?: Yes (no concerns) Adequate lighting in your home to reduce risk of falls?: Yes Life alert?: No Use of a cane, walker or w/c?: No Grab bars in the bathroom?: No Shower chair or bench in shower?: Yes Elevated toilet seat or a handicapped toilet?: Yes  TIMED UP AND GO:  Was the test performed?  No    Cognitive Function:        06/05/2023   11:37 AM 05/29/2022   11:33 AM 05/17/2021   11:23 AM  6CIT Screen  What Year? 0 points 0 points 0 points  What month? 0 points 0 points 0 points  What time? 0 points 0 points 0 points  Count back from 20 0 points 0 points 0 points  Months in reverse 0 points 0 points 0 points  Repeat phrase 0 points 0 points 0 points  Total Score 0 points 0 points 0 points    Immunizations Immunization History  Administered Date(s) Administered   Fluad Quad(high Dose 65+) 12/31/2018, 01/13/2022   Influenza Split 05/01/2011, 02/05/2017   Influenza Whole 01/17/2008, 02/05/2010   Influenza, High Dose Seasonal PF 01/11/2018, 01/24/2023   Influenza,inj,Quad PF,6+ Mos 02/07/2013, 03/08/2016   Influenza-Unspecified 03/03/2016, 02/03/2020, 01/29/2021   MMR 09/06/2017   PFIZER Comirnaty(Gray Top)Covid-19 Tri-Sucrose Vaccine 02/06/2022   PFIZER(Purple Top)SARS-COV-2 Vaccination 06/12/2019, 07/03/2019, 02/03/2020, 09/15/2020, 01/29/2021   PNEUMOCOCCAL CONJUGATE-20 10/19/2020   Pfizer(Comirnaty)Fall Seasonal Vaccine 12 years and  older 01/23/2023   Pneumococcal Conjugate-13 08/11/2014, 01/11/2018   Pneumococcal Polysaccharide-23 09/01/2015   Respiratory Syncytial Virus Vaccine,Recomb Aduvanted(Arexvy) 01/14/2022   Td 05/09/1999, 07/05/2009   Tdap 09/06/2017, 01/23/2023   Zoster Recombinant(Shingrix) 01/17/2018, 04/25/2018   Zoster, Live 08/05/2010    TDAP status: Up to date  Flu Vaccine status: Up to date  Pneumococcal vaccine status: Up to date  Covid-19 vaccine status: Completed vaccines  Qualifies for Shingles Vaccine? Yes   Zostavax completed Yes   Shingrix Completed?: Yes  Screening Tests Health Maintenance  Topic Date Due   Medicare Annual Wellness (AWV)  06/04/2024   MAMMOGRAM  12/12/2024   Colonoscopy  04/15/2026   DTaP/Tdap/Td (5 - Td or Tdap) 01/22/2033   Pneumonia Vaccine 28+ Years old  Completed   INFLUENZA VACCINE  Completed   DEXA SCAN  Completed   COVID-19 Vaccine  Completed   Hepatitis C Screening  Completed   Zoster  Vaccines- Shingrix  Completed   HPV VACCINES  Aged Out    Health Maintenance  There are no preventive care reminders to display for this patient.   Colorectal cancer screening: Type of screening: Colonoscopy. Completed 04/16/23. Repeat every 3 years  Mammogram status: Completed 2. Repeat every year  Bone Density status: Completed 12/12/2021. Results reflect: Bone density results: OSTEOPENIA. Repeat every 2 years.  Additional Screening:  Hepatitis C Screening: does not qualify; Completed 01/11/2018  Vision Screening: Recommended annual ophthalmology exams for early detection of glaucoma and other disorders of the eye. Is the patient up to date with their annual eye exam?  Yes  Who is the provider or what is the name of the office in which the patient attends annual eye exams? Dr Andres Ege If pt is not established with a provider, would they like to be referred to a provider to establish care? No .   Dental Screening: Recommended annual dental exams for proper  oral hygiene  Community Resource Referral / Chronic Care Management: CRR required this visit?  No   CCM required this visit?  No     Plan:     I have personally reviewed and noted the following in the patient's chart:   Medical and social history Use of alcohol, tobacco or illicit drugs  Current medications and supplements including opioid prescriptions. Patient is not currently taking opioid prescriptions. Functional ability and status Nutritional status Physical activity Advanced directives List of other physicians Hospitalizations, surgeries, and ER visits in previous 12 months Vitals Screenings to include cognitive, depression, and falls Referrals and appointments  In addition, I have reviewed and discussed with patient certain preventive protocols, quality metrics, and best practice recommendations. A written personalized care plan for preventive services as well as general preventive health recommendations were provided to patient.     Marzella Schlein, LPN   01/20/7828   After Visit Summary: (MyChart) Due to this being a telephonic visit, the after visit summary with patients personalized plan was offered to patient via MyChart   Nurse Notes: none

## 2023-06-05 NOTE — Patient Instructions (Signed)
Ms. Carmack , Thank you for taking time to come for your Medicare Wellness Visit. I appreciate your ongoing commitment to your health goals. Please review the following plan we discussed and let me know if I can assist you in the future.   Referrals/Orders/Follow-Ups/Clinician Recommendations: Encouraged patient to start exercising  This is a list of the screening recommended for you and due dates:  Health Maintenance  Topic Date Due   Medicare Annual Wellness Visit  05/30/2023   Mammogram  12/12/2024   Colon Cancer Screening  04/15/2026   DTaP/Tdap/Td vaccine (5 - Td or Tdap) 01/22/2033   Pneumonia Vaccine  Completed   Flu Shot  Completed   DEXA scan (bone density measurement)  Completed   COVID-19 Vaccine  Completed   Hepatitis C Screening  Completed   Zoster (Shingles) Vaccine  Completed   HPV Vaccine  Aged Out    Advanced directives: (Copy Requested) Please bring a copy of your health care power of attorney and living will to the office to be added to your chart at your convenience.  Next Medicare Annual Wellness Visit scheduled for next year: Yes

## 2023-06-09 ENCOUNTER — Other Ambulatory Visit: Payer: Self-pay | Admitting: Family Medicine

## 2023-06-23 ENCOUNTER — Other Ambulatory Visit: Payer: Self-pay | Admitting: Family Medicine

## 2023-07-05 ENCOUNTER — Other Ambulatory Visit: Payer: Self-pay | Admitting: Family Medicine

## 2023-08-07 ENCOUNTER — Other Ambulatory Visit: Payer: Self-pay | Admitting: Family Medicine

## 2023-08-22 ENCOUNTER — Other Ambulatory Visit: Payer: Self-pay | Admitting: Family Medicine

## 2023-08-27 ENCOUNTER — Other Ambulatory Visit: Payer: Self-pay | Admitting: Family Medicine

## 2023-08-29 ENCOUNTER — Other Ambulatory Visit: Payer: Self-pay | Admitting: Family Medicine

## 2023-09-05 ENCOUNTER — Other Ambulatory Visit: Payer: Self-pay | Admitting: Family Medicine

## 2023-09-18 ENCOUNTER — Other Ambulatory Visit: Payer: Self-pay | Admitting: Family Medicine

## 2023-09-29 ENCOUNTER — Other Ambulatory Visit: Payer: Self-pay | Admitting: Family Medicine

## 2023-10-03 ENCOUNTER — Encounter: Payer: Self-pay | Admitting: Family Medicine

## 2023-10-09 ENCOUNTER — Ambulatory Visit (INDEPENDENT_AMBULATORY_CARE_PROVIDER_SITE_OTHER): Payer: Medicare Other | Admitting: Family Medicine

## 2023-10-09 ENCOUNTER — Encounter: Payer: Self-pay | Admitting: Family Medicine

## 2023-10-09 ENCOUNTER — Ambulatory Visit: Payer: Self-pay | Admitting: Family Medicine

## 2023-10-09 VITALS — BP 120/60 | HR 72 | Temp 98.8°F | Ht 61.0 in | Wt 151.0 lb

## 2023-10-09 DIAGNOSIS — Z131 Encounter for screening for diabetes mellitus: Secondary | ICD-10-CM

## 2023-10-09 DIAGNOSIS — I10 Essential (primary) hypertension: Secondary | ICD-10-CM | POA: Diagnosis not present

## 2023-10-09 DIAGNOSIS — D329 Benign neoplasm of meninges, unspecified: Secondary | ICD-10-CM | POA: Diagnosis not present

## 2023-10-09 DIAGNOSIS — E559 Vitamin D deficiency, unspecified: Secondary | ICD-10-CM

## 2023-10-09 DIAGNOSIS — E663 Overweight: Secondary | ICD-10-CM

## 2023-10-09 DIAGNOSIS — E785 Hyperlipidemia, unspecified: Secondary | ICD-10-CM | POA: Diagnosis not present

## 2023-10-09 LAB — COMPREHENSIVE METABOLIC PANEL WITH GFR
ALT: 18 U/L (ref 0–35)
AST: 15 U/L (ref 0–37)
Albumin: 4.5 g/dL (ref 3.5–5.2)
Alkaline Phosphatase: 53 U/L (ref 39–117)
BUN: 30 mg/dL — ABNORMAL HIGH (ref 6–23)
CO2: 30 meq/L (ref 19–32)
Calcium: 9.8 mg/dL (ref 8.4–10.5)
Chloride: 98 meq/L (ref 96–112)
Creatinine, Ser: 1.16 mg/dL (ref 0.40–1.20)
GFR: 46.45 mL/min — ABNORMAL LOW (ref >60.00)
Glucose, Bld: 111 mg/dL — ABNORMAL HIGH (ref 70–99)
Potassium: 3.4 meq/L — ABNORMAL LOW (ref 3.5–5.1)
Sodium: 137 meq/L (ref 135–145)
Total Bilirubin: 0.6 mg/dL (ref 0.2–1.2)
Total Protein: 7.5 g/dL (ref 6.0–8.3)

## 2023-10-09 LAB — CBC WITH DIFFERENTIAL/PLATELET
Basophils Absolute: 0 10*3/uL (ref 0.0–0.1)
Basophils Relative: 0.6 % (ref 0.0–3.0)
Eosinophils Absolute: 0.1 10*3/uL (ref 0.0–0.7)
Eosinophils Relative: 2.3 % (ref 0.0–5.0)
HCT: 40.1 % (ref 36.0–46.0)
Hemoglobin: 13.6 g/dL (ref 12.0–15.0)
Lymphocytes Relative: 35.4 % (ref 12.0–46.0)
Lymphs Abs: 2.1 10*3/uL (ref 0.7–4.0)
MCHC: 34.1 g/dL (ref 30.0–36.0)
MCV: 88.2 fl (ref 78.0–100.0)
Monocytes Absolute: 0.4 10*3/uL (ref 0.1–1.0)
Monocytes Relative: 6 % (ref 3.0–12.0)
Neutro Abs: 3.2 10*3/uL (ref 1.4–7.7)
Neutrophils Relative %: 55.7 % (ref 43.0–77.0)
Platelets: 204 10*3/uL (ref 150.0–400.0)
RBC: 4.54 Mil/uL (ref 3.87–5.11)
RDW: 13.6 % (ref 11.5–15.5)
WBC: 5.8 10*3/uL (ref 4.0–10.5)

## 2023-10-09 LAB — LIPID PANEL
Cholesterol: 130 mg/dL (ref 0–200)
HDL: 56.1 mg/dL (ref >39.00)
LDL Cholesterol: 60 mg/dL (ref 0–99)
NonHDL: 74.38
Total CHOL/HDL Ratio: 2
Triglycerides: 71 mg/dL (ref 0.0–149.0)
VLDL: 14.2 mg/dL (ref 0.0–40.0)

## 2023-10-09 LAB — HEMOGLOBIN A1C: Hgb A1c MFr Bld: 6.6 % — ABNORMAL HIGH (ref 4.6–6.5)

## 2023-10-09 LAB — VITAMIN D 25 HYDROXY (VIT D DEFICIENCY, FRACTURES): VITD: 51.14 ng/mL (ref 30.00–100.00)

## 2023-10-09 NOTE — Progress Notes (Signed)
 Phone [phone] In person visit   Subjective:   Rhonda Spence is a 73 y.o. year old very pleasant female patient who presents for/with See problem oriented charting Chief Complaint  Patient presents with   Hypertension    47mo follow up; doing well, no dizziness; want to discuss 4ml tumor and if MRI is needed since its been a year?; also want to discuss Potassium Citrate    Hyperlipidemia    Past Medical History-  Patient Active Problem List   Diagnosis Date Noted   Low bone density 12/05/2021    Priority: Medium    Vitamin D  deficiency 06/08/2018    Priority: Medium    Migraine with aura 01/11/2018    Priority: Medium    Insulin resistance 01/11/2018    Priority: Medium    Glaucoma 02/02/2016    Priority: Medium    Asthma, cough variant 02/02/2016    Priority: Medium    Obstructive sleep apnea of adult 02/02/2016    Priority: Medium    Hyperlipidemia, unspecified 02/12/2006    Priority: Medium    Essential hypertension 02/12/2006    Priority: Medium    Brittle nails 02/08/2018    Priority: Low   History of adenomatous polyp of colon 01/11/2018    Priority: Low   Hypertrophy of nasal turbinates 02/01/2017    Priority: Low   Allergic rhinitis 09/28/2016    Priority: Low   Bilateral temporomandibular joint pain 09/28/2016    Priority: Low   Seasonal and perennial allergic rhinitis 04/25/2016    Priority: Low   Age-related nuclear cataract of both eyes 01/12/2015    Priority: Low   NEPHROLITHIASIS, HX OF 02/12/2006    Priority: Low    Medications- reviewed and updated Current Outpatient Medications  Medication Sig Dispense Refill   acetaminophen (TYLENOL) 650 MG CR tablet Take 650 mg by mouth every 8 (eight) hours as needed for pain. As needed     amLODipine  (NORVASC ) 5 MG tablet Take 1 tablet by mouth once daily 90 tablet 0   aspirin EC 81 MG tablet Take 81 mg by mouth daily. Swallow whole.     azelastine  (ASTELIN ) 0.1 % nasal spray Use in each  nostril as directed 90 mL 0   calcium  carbonate (OS-CAL) 600 MG TABS tablet Take 600 mg by mouth daily with breakfast. 2 tablets     chlorthalidone  (HYGROTON ) 25 MG tablet Take 1 tablet by mouth once daily 90 tablet 0   Cholecalciferol (VITAMIN D -3 PO) Take 5,400 Int'l Units/L by mouth.     Coenzyme Q10 100 MG capsule Take by mouth.     famotidine (PEPCID) 20 MG tablet Take 20 mg by mouth 1 day or 1 dose.     ketoconazole  (NIZORAL ) 2 % shampoo APPLY TOPICALLY (SHAMPOO) TWICE A WEEK 120 mL 0   latanoprost (XALATAN) 0.005 % ophthalmic solution Place 1 drop into both eyes at bedtime.     losartan  (COZAAR ) 100 MG tablet Take 1 tablet by mouth once daily 90 tablet 0   montelukast  (SINGULAIR ) 10 MG tablet Take 1 tablet by mouth once daily with breakfast 90 tablet 0   nebivolol  (BYSTOLIC ) 2.5 MG tablet Take 1 tablet by mouth once daily 90 tablet 0   potassium citrate  (UROCIT-K ) 10 MEQ (1080 MG) SR tablet Take 1 tablet by mouth twice daily 180 tablet 0   psyllium (METAMUCIL) 58.6 % packet Take 1 packet by mouth daily.     rosuvastatin  (CRESTOR ) 40 MG tablet Take 1 tablet by mouth  once daily 90 tablet 0   VENTOLIN  HFA 108 (90 Base) MCG/ACT inhaler Inhale 2 puffs into the lungs every 4 (four) hours as needed for wheezing or shortness of breath. 18 g 3   No current facility-administered medications for this visit.     Objective:  BP 120/60   Pulse 72   Temp 98.8 F (37.1 C) (Temporal)   Ht 5\' 1"  (1.549 m)   Wt 151 lb (68.5 kg)   SpO2 96%   BMI 28.53 kg/m  Gen: NAD, resting comfortably CV: RRR no murmurs rubs or gallops Lungs: CTAB no crackles, wheeze, rhonchi Ext: no edema Skin: warm, dry     Assessment and Plan   # Social update-ongoing marital stress- may look at divorce.  Son's situation with long-term care facility in a better situation - working through some issues. Got gold medal in bowling lately.  -Last visit plan to add therapy but was not able to find someone that accepts  insurance  -Had also been helpful for couples counseling- once again no opening here - he's unwilling to pursue -Was having some abdominal discomfort with stress previously-CBC, CMP, lipase reassuring and was also going to trial famotidine- better lately with this   #hypertension S: medication: Losartan , 100 mg; Amlodipine  5 mg; Chlorthalidone  25 mg, nebivolol  2.5 mg daily. No dizziness BP Readings from Last 3 Encounters:  10/09/23 120/60  04/16/23 103/66  04/09/23 120/68  A/P: well controlled continue current medications   #hyperlipidemia- peak LDL at 206 # History of lacunar infarct noted on MRI 04/14/2022- S: Medication:Rosuvastatin  40 mg daily- on for about 1.5 years on daily , coenzyme Q 10, aspirin 81 mg Lab Results  Component Value Date   CHOL 116 10/04/2022   HDL 51.60 10/04/2022   LDLCALC 48 10/04/2022   LDLDIRECT 102.0 10/14/2020   TRIG 81.0 10/04/2022   CHOLHDL 2 10/04/2022  A/P: lipids have been phenomenal but concerned about prediabetes risk on statin- wants to go down to 5 days a week- update lipids and likely can make this reduction.  -with history of lacunar infarct prefer LDL at least under 70 still  # Hyperglycemia/insulin resistance/prediabetes- peak a1c of 6.5 but only once S:  Medication: none Exercise and diet- in may did really good job with exercise. Weight up 2 lbs from last visit on our scales Lab Results  Component Value Date   HGBA1C 6.5 04/09/2023   HGBA1C 6.3 10/04/2022   HGBA1C 6.1 12/05/2021  A/P: hopefully improved- update a1c today. See lipid section- wants to try to reduce to see if risk will decrease  #Vitamin D  deficiency S: Medication: 5400 units in am Last vitamin D  Lab Results  Component Value Date   VD25OH 60.47 10/04/2022  A/P: hopefully stable- update vitamin D  today. Continue current meds for now    # Asthma-cough variant follows with Dr. Linder Revere (oral appliance and Dr. Ardell Koller for OSA)-.  Attempts to control allergies with Singulair ,  Astelin  (not needing lately) and has albuterol  available if needed- no tneeding much at all   # History of kidney stones-on potassium citrate  10 meq- no stone since 2011 on this. Cost is going to go up but wants to continue   # Meningioma follow-up-scheduled October 18, 2022 stable at 5 mm with Dr. Festus Hubert with plan for yearly follow-up- ordered today. No headache(s). Some blurry vision but working with optho related to catarcts  Recommended follow up: Return in about 6 months (around 04/09/2024) for followup or sooner if needed.Schedule b4 you  leave. Future Appointments  Date Time Provider Department Center  06/09/2024 11:20 AM [facility] ANNUAL WELLNESS VISIT 1 [facility]    Lab/Order associations: NOT fasting   ICD-10-CM   1. Meningioma (HCC)  D32.9 MR Brain W Wo Contrast    2. Screening for diabetes mellitus  Z13.1 Hemoglobin A1c    3. Overweight  E66.3 Hemoglobin A1c    4. Essential hypertension  I10 Comprehensive metabolic panel with GFR    CBC with Differential/Platelet    Lipid panel    5. Hyperlipidemia, unspecified hyperlipidemia type  E78.5 Comprehensive metabolic panel with GFR    CBC with Differential/Platelet    Lipid panel    6. Vitamin D  deficiency  E55.9 Vitamin D  (25 hydroxy)      No orders of the defined types were placed in this encounter.   Return precautions advised.  Clarisa Crooked, MD

## 2023-10-09 NOTE — Patient Instructions (Signed)
 Please stop by lab before you go If you have mychart- we will send your results within 3 business days of us  receiving them.  If you do not have mychart- we will call you about results within 5 business days of us  receiving them.  *please also note that you will see labs on mychart as soon as they post. I will later go in and write notes on them- will say "notes from Dr. Arlene Ben"   We will call you within two weeks about your referral to MRI brain through [facility].  Their phone number is [phone].  Please call them if you have not heard in 1-2 weeks  Recommended follow up: Return in about 6 months (around 04/09/2024) for followup or sooner if needed.Schedule b4 you leave.

## 2023-10-11 ENCOUNTER — Encounter: Payer: Self-pay | Admitting: Family Medicine

## 2023-10-11 NOTE — Telephone Encounter (Signed)
 Bmp has been addressed see below regarding medication.

## 2023-10-12 MED ORDER — CHLORTHALIDONE 12.5 MG PO TABS: 12.5000 mg | ORAL_TABLET | Freq: Every day | ORAL | 3 refills | Status: AC

## 2023-10-26 ENCOUNTER — Ambulatory Visit
Admission: RE | Admit: 2023-10-26 | Discharge: 2023-10-26 | Disposition: A | Discharge status: Home / Self Care | Source: Ambulatory Visit | Attending: Family Medicine | Admitting: Family Medicine

## 2023-10-26 DIAGNOSIS — I6782 Cerebral ischemia: Secondary | ICD-10-CM | POA: Diagnosis not present

## 2023-10-26 DIAGNOSIS — D320 Benign neoplasm of cerebral meninges: Secondary | ICD-10-CM | POA: Diagnosis not present

## 2023-10-26 DIAGNOSIS — D329 Benign neoplasm of meninges, unspecified: Secondary | ICD-10-CM

## 2023-10-26 MED ORDER — GADOPICLENOL 0.5 MMOL/ML IV SOLN
7.0000 mL | Freq: Once | INTRAVENOUS | Status: AC | PRN
  Administered 2023-10-26: 7 mL via INTRAVENOUS

## 2023-10-30 ENCOUNTER — Other Ambulatory Visit: Payer: Self-pay | Admitting: Family Medicine

## 2023-10-30 DIAGNOSIS — Z1231 Encounter for screening mammogram for malignant neoplasm of breast: Secondary | ICD-10-CM

## 2023-10-31 ENCOUNTER — Other Ambulatory Visit: Payer: Self-pay | Admitting: Family Medicine

## 2023-11-06 ENCOUNTER — Other Ambulatory Visit (INDEPENDENT_AMBULATORY_CARE_PROVIDER_SITE_OTHER)

## 2023-11-06 ENCOUNTER — Ambulatory Visit: Payer: Self-pay | Admitting: Family Medicine

## 2023-11-06 DIAGNOSIS — I10 Essential (primary) hypertension: Secondary | ICD-10-CM

## 2023-11-06 LAB — BASIC METABOLIC PANEL WITH GFR
BUN: 19 mg/dL (ref 6–23)
CO2: 31 meq/L (ref 19–32)
Calcium: 10.1 mg/dL (ref 8.4–10.5)
Chloride: 99 meq/L (ref 96–112)
Creatinine, Ser: 0.79 mg/dL (ref 0.40–1.20)
GFR: 73.62 mL/min (ref >60.00)
Glucose, Bld: 102 mg/dL — ABNORMAL HIGH (ref 70–99)
Potassium: 3.8 meq/L (ref 3.5–5.1)
Sodium: 136 meq/L (ref 135–145)

## 2023-11-07 ENCOUNTER — Encounter: Payer: Self-pay | Admitting: Family Medicine

## 2023-11-08 ENCOUNTER — Other Ambulatory Visit: Payer: Self-pay | Admitting: Family Medicine

## 2023-11-15 ENCOUNTER — Other Ambulatory Visit: Payer: Self-pay | Admitting: Family Medicine

## 2023-11-28 ENCOUNTER — Other Ambulatory Visit: Payer: Self-pay | Admitting: Family Medicine

## 2023-12-14 ENCOUNTER — Other Ambulatory Visit: Payer: Self-pay | Admitting: Family Medicine

## 2023-12-14 ENCOUNTER — Ambulatory Visit
Admission: RE | Admit: 2023-12-14 | Discharge: 2023-12-14 | Disposition: A | Discharge status: Home / Self Care | Source: Ambulatory Visit | Attending: Family Medicine | Admitting: Family Medicine

## 2023-12-14 DIAGNOSIS — Z1231 Encounter for screening mammogram for malignant neoplasm of breast: Secondary | ICD-10-CM | POA: Diagnosis not present

## 2023-12-14 NOTE — Telephone Encounter (Signed)
 Last OV 10/09/23 Next OV 04/10/24  Last refill(s) Amlodipine  09/05/23 Qty #90/0  Rosuvastatin  09/18/23 Qty #90/0

## 2024-01-02 ENCOUNTER — Other Ambulatory Visit: Payer: Self-pay | Admitting: Family Medicine

## 2024-01-22 ENCOUNTER — Other Ambulatory Visit: Payer: Self-pay | Admitting: Family Medicine

## 2024-01-23 DIAGNOSIS — Z23 Encounter for immunization: Secondary | ICD-10-CM | POA: Diagnosis not present

## 2024-01-28 ENCOUNTER — Other Ambulatory Visit: Payer: Self-pay | Admitting: Family Medicine

## 2024-02-07 ENCOUNTER — Other Ambulatory Visit: Payer: Self-pay | Admitting: Family Medicine

## 2024-02-26 ENCOUNTER — Other Ambulatory Visit: Payer: Self-pay | Admitting: Family Medicine

## 2024-04-07 ENCOUNTER — Other Ambulatory Visit: Payer: Self-pay | Admitting: Family Medicine

## 2024-04-10 ENCOUNTER — Ambulatory Visit: Admitting: Family Medicine

## 2024-04-10 ENCOUNTER — Ambulatory Visit: Payer: Self-pay | Admitting: Family Medicine

## 2024-04-10 ENCOUNTER — Encounter: Payer: Self-pay | Admitting: Family Medicine

## 2024-04-10 VITALS — BP 122/60 | HR 75 | Temp 97.8°F | Ht 61.0 in | Wt 150.8 lb

## 2024-04-10 DIAGNOSIS — E1169 Type 2 diabetes mellitus with other specified complication: Secondary | ICD-10-CM | POA: Diagnosis not present

## 2024-04-10 DIAGNOSIS — I10 Essential (primary) hypertension: Secondary | ICD-10-CM

## 2024-04-10 DIAGNOSIS — E785 Hyperlipidemia, unspecified: Secondary | ICD-10-CM | POA: Diagnosis not present

## 2024-04-10 DIAGNOSIS — E118 Type 2 diabetes mellitus with unspecified complications: Secondary | ICD-10-CM

## 2024-04-10 LAB — MICROALBUMIN / CREATININE URINE RATIO
Creatinine,U: 57.4 mg/dL
Microalb Creat Ratio: 15.1 mg/g (ref 0.0–30.0)
Microalb, Ur: 0.9 mg/dL (ref 0.0–1.9)

## 2024-04-10 LAB — COMPREHENSIVE METABOLIC PANEL WITH GFR
ALT: 24 U/L (ref 0–35)
AST: 16 U/L (ref 0–37)
Albumin: 4.6 g/dL (ref 3.5–5.2)
Alkaline Phosphatase: 53 U/L (ref 39–117)
BUN: 26 mg/dL — ABNORMAL HIGH (ref 6–23)
CO2: 29 meq/L (ref 19–32)
Calcium: 10.2 mg/dL (ref 8.4–10.5)
Chloride: 101 meq/L (ref 96–112)
Creatinine, Ser: 0.96 mg/dL (ref 0.40–1.20)
GFR: 58.09 mL/min — ABNORMAL LOW (ref >60.00)
Glucose, Bld: 106 mg/dL — ABNORMAL HIGH (ref 70–99)
Potassium: 3.8 meq/L (ref 3.5–5.1)
Sodium: 138 meq/L (ref 135–145)
Total Bilirubin: 0.6 mg/dL (ref 0.2–1.2)
Total Protein: 7.6 g/dL (ref 6.0–8.3)

## 2024-04-10 LAB — TSH: TSH: 1.62 u[IU]/mL (ref 0.35–5.50)

## 2024-04-10 LAB — CBC WITH DIFFERENTIAL/PLATELET
Basophils Absolute: 0.1 K/uL (ref 0.0–0.1)
Basophils Relative: 0.9 % (ref 0.0–3.0)
Eosinophils Absolute: 0.2 K/uL (ref 0.0–0.7)
Eosinophils Relative: 2.9 % (ref 0.0–5.0)
HCT: 40.5 % (ref 36.0–46.0)
Hemoglobin: 13.9 g/dL (ref 12.0–15.0)
Lymphocytes Relative: 36 % (ref 12.0–46.0)
Lymphs Abs: 2.2 K/uL (ref 0.7–4.0)
MCHC: 34.4 g/dL (ref 30.0–36.0)
MCV: 89.9 fl (ref 78.0–100.0)
Monocytes Absolute: 0.5 K/uL (ref 0.1–1.0)
Monocytes Relative: 7.6 % (ref 3.0–12.0)
Neutro Abs: 3.2 K/uL (ref 1.4–7.7)
Neutrophils Relative %: 52.6 % (ref 43.0–77.0)
Platelets: 212 K/uL (ref 150.0–400.0)
RBC: 4.51 Mil/uL (ref 3.87–5.11)
RDW: 12.9 % (ref 11.5–15.5)
WBC: 6.2 K/uL (ref 4.0–10.5)

## 2024-04-10 LAB — HEMOGLOBIN A1C: Hgb A1c MFr Bld: 6.2 % (ref 4.6–6.5)

## 2024-04-10 NOTE — Progress Notes (Signed)
 Phone [phone] In person visit   Subjective:   Rhonda Spence is a 73 y.o. year old very pleasant female patient who presents for/with See problem oriented charting Chief Complaint  Patient presents with   Medical Management of Chronic Issues    6 month follow up; pt was bitten by a tick 2 years ago and now has a white spot where it was and it is rough; nails have been brittle, would like to know if there is anything else she can do other than the biotin gummies;    Past Medical History-  Patient Active Problem List   Diagnosis Date Noted   Type 2 diabetes mellitus with unspecified complications (HCC) 01/11/2018    Priority: High   Low bone density 12/05/2021    Priority: Medium    Vitamin D  deficiency 06/08/2018    Priority: Medium    Migraine with aura 01/11/2018    Priority: Medium    Glaucoma 02/02/2016    Priority: Medium    Asthma, cough variant 02/02/2016    Priority: Medium    Obstructive sleep apnea of adult 02/02/2016    Priority: Medium    Hyperlipidemia associated with type 2 diabetes mellitus (HCC) 02/12/2006    Priority: Medium    Essential hypertension 02/12/2006    Priority: Medium    Brittle nails 02/08/2018    Priority: Low   History of adenomatous polyp of colon 01/11/2018    Priority: Low   Hypertrophy of nasal turbinates 02/01/2017    Priority: Low   Allergic rhinitis 09/28/2016    Priority: Low   Bilateral temporomandibular joint pain 09/28/2016    Priority: Low   Seasonal and perennial allergic rhinitis 04/25/2016    Priority: Low   Age-related nuclear cataract of both eyes 01/12/2015    Priority: Low   NEPHROLITHIASIS, HX OF 02/12/2006    Priority: Low    Medications- reviewed and updated Current Outpatient Medications  Medication Sig Dispense Refill   acetaminophen (TYLENOL) 650 MG CR tablet Take 650 mg by mouth every 8 (eight) hours as needed for pain. As needed     amLODipine  (NORVASC ) 5 MG tablet Take 1 tablet by mouth once  daily 90 tablet 1   aspirin EC 81 MG tablet Take 81 mg by mouth daily. Swallow whole.     azelastine  (ASTELIN ) 0.1 % nasal spray USE 1 SPRAY(S) IN EACH NOSTRIL ONCE DAILY AS DIRECTED 30 mL 0   Biotin 2500 MCG CHEW Chew 2 lozenges by mouth daily.     calcium  carbonate (OS-CAL) 600 MG TABS tablet Take 600 mg by mouth daily with breakfast. 2 tablets     Chlorthalidone  12.5 MG TABS Take 12.5 mg by mouth daily. 90 tablet 3   Cholecalciferol (VITAMIN D -3 PO) Take 5,400 Int'l Units/L by mouth.     Coenzyme Q10 100 MG capsule Take by mouth.     famotidine (PEPCID) 20 MG tablet Take 20 mg by mouth 1 day or 1 dose.     ketoconazole  (NIZORAL ) 2 % shampoo APPLY TOPICALLY (SHAMPOO) TWICE A WEEK 120 mL 0   latanoprost (XALATAN) 0.005 % ophthalmic solution Place 1 drop into both eyes at bedtime.     losartan  (COZAAR ) 100 MG tablet Take 1 tablet by mouth once daily 90 tablet 0   montelukast  (SINGULAIR ) 10 MG tablet Take 1 tablet by mouth once daily with breakfast 90 tablet 0   nebivolol  (BYSTOLIC ) 2.5 MG tablet Take 1 tablet by mouth once daily 90 tablet 0  potassium citrate  (UROCIT-K ) 10 MEQ (1080 MG) SR tablet Take 1 tablet by mouth twice daily 180 tablet 0   psyllium (METAMUCIL) 58.6 % packet Take 1 packet by mouth daily.     rosuvastatin  (CRESTOR ) 40 MG tablet Take 1 tablet by mouth once daily 90 tablet 1   VENTOLIN  HFA 108 (90 Base) MCG/ACT inhaler Inhale 2 puffs into the lungs every 4 (four) hours as needed for wheezing or shortness of breath. 18 g 3   No current facility-administered medications for this visit.     Objective:  BP 122/60 (BP Location: Left Arm, Patient Position: Sitting, Cuff Size: Normal)   Pulse 75   Temp 97.8 F (36.6 C) (Temporal)   Ht 5' 1 (1.549 m)   Wt 150 lb 12.8 oz (68.4 kg)   SpO2 98%   BMI 28.49 kg/m  Gen: NAD, resting comfortably CV: RRR no murmurs rubs or gallops Lungs: CTAB no crackles, wheeze, rhonchi Ext: no edema Skin: warm, dry  Diabetic foot exam was  performed with the following findings:   No deformities, ulcerations, or other skin breakdown Normal sensation of 10g monofilament Intact posterior tibialis and dorsalis pedis pulses        Assessment and Plan   # Skin change S: Patient bitten by a tick approximately 2 years ago and now has a white spot at site of prior bite.    A/P: on exam  - mildly erythematous 1-1.5 by 1-1.5 cm erythematous area with central white head- deroofed this and whitish material typically seen in sebaceous cyst noted. - I think we can montior this and she will discuss with dermatology   # Nail changes S: Nails have been brittle and wonder if she can do anything other than taking biotin Gummies.  She has not had a TSH test in several years. No hair changes. No constipation more than usual- still on metamucil Lab Results  Component Value Date   TSH 1.19 12/31/2018  A/P: with nail changes will check TSH with labs . Has also mentioned of the dermatology    # Diabetes- new diagnosis 10/09/23 S: Medication:none  Exercise and diet- feels has been craving carbohydrates more. Weight down 1 pounds. Feels veggie desire has gone down. Had been aggressive on low carb previously -doing walk fit program on her phone for 20-30 minutes and really enjoying it Lab Results  Component Value Date   HGBA1C 6.6 (H) 10/09/2023   HGBA1C 6.5 04/09/2023   HGBA1C 6.3 10/04/2022   A/P: hopefully stable- update a1c today. Continue without meds for now  -with next eye exam mention new diabetes diagnosis and have them send us  a copy of exam  #hypertension S: medication: Losartan , 100 mg; Amlodipine  5 mg; Chlorthalidone  12.5 mg daily  -Hold nebivolol  2.5 mg in mid 2023 due to orthostatic symptoms- had to restart dec 2023 Home readings #s: 110s-140's with mostly 120s and 130s BP Readings from Last 3 Encounters:  04/10/24 122/60  10/09/23 120/60  04/16/23 103/66  A/P: blood pressure well controlled continue current medications    #hyperlipidemia- peak LDL at 206 # History of lacunar infarct noted on MRI 04/14/2022 S: Medication:Rosuvastatin  40 mg- considered 5 days a week but with diabetes diganosis we left this alone, coenzyme Q 10, aspirin 81 mg  Lab Results  Component Value Date   CHOL 130 10/09/2023   HDL 56.10 10/09/2023   LDLCALC 60 10/09/2023   LDLDIRECT 102.0 10/14/2020   TRIG 71.0 10/09/2023   CHOLHDL 2 10/09/2023  A/P:  lipids look great- continue current medications . With prior lacunar infarct we want to keep this at least under 70 for LDL  #meningioma- stable on recent imaging. Has had some aura episodes without headache- sparkly lights but brief  and then peripheral vision only. Has discussed with eye doctor as well  # Asthma-cough variant follows with Dr. Neysa (oral appliance and Dr. Micky for OSA)-.  Attempts to control allergies with Singulair , Astelin  and has albuterol  available if needed- has not needed   # History of kidney stones-on potassium citrate  10 meq   Recommended follow up: Return in about 6 months (around 10/09/2024) for followup or sooner if needed.Schedule b4 you leave. Future Appointments  Date Time Provider Department Center  06/09/2024 11:20 AM [facility] ANNUAL WELLNESS VISIT 1 [facility] [place]   Lab/Order associations:   ICD-10-CM   1. Type 2 diabetes mellitus with unspecified complications (HCC)  E11.8 Comprehensive metabolic panel with GFR    CBC with Differential/Platelet    Microalbumin / creatinine urine ratio    Hemoglobin A1c    2. Hyperlipidemia associated with type 2 diabetes mellitus (HCC)  E11.69 TSH   E78.5 Comprehensive metabolic panel with GFR    CBC with Differential/Platelet    Microalbumin / creatinine urine ratio    Hemoglobin A1c    3. Essential hypertension  I10       No orders of the defined types were placed in this encounter.   Return precautions advised.  Garnette Lukes, MD

## 2024-04-10 NOTE — Patient Instructions (Addendum)
 Please stop by lab before you go If you have mychart- we will send your results within 3 business days of us  receiving them.  If you do not have mychart- we will call you about results within 5 business days of us  receiving them.  *please also note that you will see labs on mychart as soon as they post. I will later go in and write notes on them- will say notes from Dr. Katrinka   No changes today unless labs lead us  to make changes  -with next eye exam mention new diabetes diagnosis and have them send us  a copy of exam  Recommended follow up: Return in about 6 months (around 10/09/2024) for followup or sooner if needed.Schedule b4 you leave.

## 2024-04-11 NOTE — Progress Notes (Signed)
 Patient read Dr. Carolee results notes via MyChart.   Last read by Erminio JINNY Fess at 9:51PM on 04/10/2024.

## 2024-04-22 DIAGNOSIS — H40023 Open angle with borderline findings, high risk, bilateral: Secondary | ICD-10-CM | POA: Diagnosis not present

## 2024-04-22 LAB — OPHTHALMOLOGY REPORT-SCANNED

## 2024-04-25 ENCOUNTER — Other Ambulatory Visit: Payer: Self-pay | Admitting: Family Medicine

## 2024-05-02 ENCOUNTER — Other Ambulatory Visit: Payer: Self-pay | Admitting: Family Medicine

## 2024-05-20 ENCOUNTER — Other Ambulatory Visit: Payer: Self-pay | Admitting: Family Medicine

## 2024-05-22 ENCOUNTER — Other Ambulatory Visit: Payer: Self-pay | Admitting: Family Medicine

## 2024-06-09 ENCOUNTER — Ambulatory Visit (INDEPENDENT_AMBULATORY_CARE_PROVIDER_SITE_OTHER): Payer: Medicare Other

## 2024-06-09 VITALS — BP 138/70 | Ht 61.5 in | Wt 150.0 lb

## 2024-06-09 DIAGNOSIS — Z0000 Encounter for general adult medical examination without abnormal findings: Secondary | ICD-10-CM

## 2024-10-09 ENCOUNTER — Ambulatory Visit: Admitting: Family Medicine

## 2025-06-11 ENCOUNTER — Ambulatory Visit
